# Patient Record
Sex: Male | Born: 1955 | Race: Black or African American | Hispanic: No | Marital: Single | Smoking: Current every day smoker
Health system: Southern US, Community
[De-identification: ages and names within clinical notes are randomized; demographics above are authoritative.]

## PROBLEM LIST (undated history)

## (undated) DIAGNOSIS — N19 Unspecified kidney failure: Secondary | ICD-10-CM

## (undated) DIAGNOSIS — M109 Gout, unspecified: Secondary | ICD-10-CM

## (undated) DIAGNOSIS — I10 Essential (primary) hypertension: Secondary | ICD-10-CM

## (undated) HISTORY — DX: Unspecified kidney failure: N19

---

## 2012-10-10 ENCOUNTER — Emergency Department (HOSPITAL_COMMUNITY)
Admission: EM | Admit: 2012-10-10 | Discharge: 2012-10-10 | Disposition: A | Discharge status: Home / Self Care | Payer: No Typology Code available for payment source | Attending: Emergency Medicine | Admitting: Emergency Medicine

## 2012-10-10 ENCOUNTER — Encounter (HOSPITAL_COMMUNITY): Payer: Self-pay | Admitting: Nurse Practitioner

## 2012-10-10 DIAGNOSIS — X500XXA Overexertion from strenuous movement or load, initial encounter: Secondary | ICD-10-CM | POA: Insufficient documentation

## 2012-10-10 DIAGNOSIS — Y929 Unspecified place or not applicable: Secondary | ICD-10-CM | POA: Insufficient documentation

## 2012-10-10 DIAGNOSIS — S93609A Unspecified sprain of unspecified foot, initial encounter: Secondary | ICD-10-CM | POA: Insufficient documentation

## 2012-10-10 DIAGNOSIS — Y9389 Activity, other specified: Secondary | ICD-10-CM | POA: Insufficient documentation

## 2012-10-10 DIAGNOSIS — S93602A Unspecified sprain of left foot, initial encounter: Secondary | ICD-10-CM

## 2012-10-10 DIAGNOSIS — I10 Essential (primary) hypertension: Secondary | ICD-10-CM

## 2012-10-10 MED ORDER — HYDROCODONE-ACETAMINOPHEN 5-325 MG PO TABS: 1.0000 | ORAL_TABLET | ORAL | Status: DC | PRN

## 2012-10-10 MED ORDER — IBUPROFEN 800 MG PO TABS: 800.0000 mg | ORAL_TABLET | Freq: Three times a day (TID) | ORAL | Status: DC

## 2012-10-10 MED ORDER — HYDROCHLOROTHIAZIDE 25 MG PO TABS: 25.0000 mg | ORAL_TABLET | Freq: Every day | ORAL | Status: DC

## 2012-10-10 NOTE — ED Provider Notes (Signed)
History    This chart was scribed for non-physician practitioner Charlann Lange, PA-C working with Kathalene Frames, MD by Hampton Abbot, ED Scribe. This patient was seen in room TR07C/TR07C and the patient's care was started at 5:42 PM.   CSN: [id number]  Arrival date & time 10/10/12  1613   First MD Initiated Contact with Patient 10/10/12 1723      Chief Complaint  Patient presents with  . Foot Pain     The history is provided by the patient. No language interpreter was used.  Brad [name] is a 57 y.o. male who presents to the Emergency Department complaining of left foot pain over dorsal surface first noticed when waking up this morning.  Pain worsened when bearing weight.  No associated injuries or traumas to the area.  Pt denies pain in plantar plane of foot, left ankle pain and pain in any of the left toes.  No h/o DM or gout.  Pt also reports that he has HTN for which he has had 30-day medication but that he is not currently being treated for. No PCP.  No past medical history on file.  No past surgical history on file.  No family history on file.  History  Substance Use Topics  . Smoking status: Never Smoker   . Smokeless tobacco: Not on file  . Alcohol Use: Yes      Review of Systems  Musculoskeletal:       Positive left foot pain  Skin: Negative for wound.    Allergies  Review of patient's allergies indicates no known allergies.  Home Medications  No current outpatient prescriptions on file.  BP 181/119  Pulse 91  Temp(Src) 99.1 F (37.3 C) (Oral)  Resp 16  SpO2 96%  Physical Exam  Nursing note and vitals reviewed. Constitutional: He is oriented to person, place, and time. He appears well-developed and well-nourished. No distress.  HENT:  Head: Normocephalic and atraumatic.  Eyes: EOM are normal.  Neck: Neck supple. No tracheal deviation present.  Cardiovascular: Normal rate.   Pulmonary/Chest: Effort normal. No respiratory distress.  Musculoskeletal:  Normal range of motion.  Mild erythema and swelling mid dorsal forefoot, no bony deformities, ankle and distal joints non-tender, calf non-tender  Neurological: He is alert and oriented to person, place, and time.  Skin: Skin is warm and dry.  Psychiatric: He has a normal mood and affect. His behavior is normal.    ED Course  Procedures (including critical care time) DIAGNOSTIC STUDIES: Oxygen Saturation is 96% on room air, adequate by my interpretation.    COORDINATION OF CARE: 5:47 PM- Informed pt that pain is most likely due to a mild sprain and to treat area with rest, ice, and elevation.  Discussed anti-inflammatories and stronger pain medicine for pain control at this time, but that establishing relationship with PCP is necessary and provided pt with contact information.  Pt verbalizes understanding and agrees with plan.     No diagnosis found.  1. Left foot strain/sprain 2. Hypertension   MDM  No redness or warmth to suggest cellulitis or infection. Does not involve joint - not gout or arthritic condition. C/w sprain or strain injury.  Hypertension history with elevated BP today. No cardiovascular symptoms. Will re-start medications and refer to PCP.       I personally performed the services described in this documentation, which was scribed in my presence. The recorded information has been reviewed and is accurate.     Dewaine Oats,  PA-C 10/10/12 1755

## 2012-10-10 NOTE — Progress Notes (Signed)
Orthopedic Tech Progress Note Patient Details:  Brad [name] 31-Jan-1956 [id number]  Ortho Devices Type of Ortho Device: ASO;Crutches Ortho Device/Splint Location: left ankle/foot Ortho Device/Splint Interventions: Application   Hildred Priest 10/10/2012, 6:31 PM

## 2012-10-10 NOTE — ED Notes (Signed)
Woke this am with L foot pain. Took tylenol with some relief. Denies any injuries, cms intact

## 2012-10-10 NOTE — Discharge Instructions (Signed)
CALL [facility] TO REQUEST A LIST OF PARTICIPATING PHYSICIANS IN THE AREA SO THAT YOU CAN GET ESTABLISHED WITH A PRIMARY CARE PHYSICIAN FOR CARE OF HIGH BLOOD PRESSURE AND RECURRENT FOOT PAIN. ICE AND ELEVATE FOOT. TAKE MEDICATIONS AS PRESCRIBED.   Arterial Hypertension Arterial hypertension (high blood pressure) is a condition of elevated pressure in your blood vessels. Hypertension over a long period of time is a risk factor for strokes, heart attacks, and heart failure. It is also the leading cause of kidney (renal) failure.  CAUSES   In Adults -- Over 90% of all hypertension has no known cause. This is called essential or primary hypertension. In the other 10% of people with hypertension, the increase in blood pressure is caused by another disorder. This is called secondary hypertension. Important causes of secondary hypertension are:  Heavy alcohol use.  Obstructive sleep apnea.  Hyperaldosterosim (Conn's syndrome).  Steroid use.  Chronic kidney failure.  Hyperparathyroidism.  Medications.  Renal artery stenosis.  Pheochromocytoma.  Cushing's disease.  Coarctation of the aorta.  Scleroderma renal crisis.  Licorice (in excessive amounts).  Drugs (cocaine, methamphetamine). Your caregiver can explain any items above that apply to you.  In Children -- Secondary hypertension is more common and should always be considered.  Pregnancy -- Few women of childbearing age have high blood pressure. However, up to 10% of them develop hypertension of pregnancy. Generally, this will not harm the woman. It may be a sign of 3 complications of pregnancy: preeclampsia, HELLP syndrome, and eclampsia. Follow up and control with medication is necessary. SYMPTOMS   This condition normally does not produce any noticeable symptoms. It is usually found during a routine exam.  Malignant hypertension is a late problem of high blood pressure. It may have the following  symptoms:  Headaches.  Blurred vision.  End-organ damage (this means your kidneys, heart, lungs, and other organs are being damaged).  Stressful situations can increase the blood pressure. If a person with normal blood pressure has their blood pressure go up while being seen by their caregiver, this is often termed "white coat hypertension." Its importance is not known. It may be related with eventually developing hypertension or complications of hypertension.  Hypertension is often confused with mental tension, stress, and anxiety. DIAGNOSIS  The diagnosis is made by 3 separate blood pressure measurements. They are taken at least 1 week apart from each other. If there is organ damage from hypertension, the diagnosis may be made without repeat measurements. Hypertension is usually identified by having blood pressure readings:  Above 140/90 mmHg measured in both arms, at 3 separate times, over a couple weeks.  Over 130/80 mmHg should be considered a risk factor and may require treatment in patients with diabetes. Blood pressure readings over 120/80 mmHg are called "pre-hypertension" even in non-diabetic patients. To get a true blood pressure measurement, use the following guidelines. Be aware of the factors that can alter blood pressure readings.  Take measurements at least 1 hour after caffeine.  Take measurements 30 minutes after smoking and without any stress. This is another reason to quit smoking  it raises your blood pressure.  Use a proper cuff size. Ask your caregiver if you are not sure about your cuff size.  Most home blood pressure cuffs are automatic. They will measure systolic and diastolic pressures. The systolic pressure is the pressure reading at the start of sounds. Diastolic pressure is the pressure at which the sounds disappear. If you are elderly, measure pressures in multiple postures.  Try sitting, lying or standing.  Sit at rest for a minimum of 5 minutes before  taking measurements.  You should not be on any medications like decongestants. These are found in many cold medications.  Record your blood pressure readings and review them with your caregiver. If you have hypertension:  Your caregiver may do tests to be sure you do not have secondary hypertension (see "causes" above).  Your caregiver may also look for signs of metabolic syndrome. This is also called Syndrome X or Insulin Resistance Syndrome. You may have this syndrome if you have type 2 diabetes, abdominal obesity, and abnormal blood lipids in addition to hypertension.  Your caregiver will take your medical and family history and perform a physical exam.  Diagnostic tests may include blood tests (for glucose, cholesterol, potassium, and kidney function), a urinalysis, or an EKG. Other tests may also be necessary depending on your condition. PREVENTION  There are important lifestyle issues that you can adopt to reduce your chance of developing hypertension:  Maintain a normal weight.  Limit the amount of salt (sodium) in your diet.  Exercise often.  Limit alcohol intake.  Get enough potassium in your diet. Discuss specific advice with your caregiver.  Follow a DASH diet (dietary approaches to stop hypertension). This diet is rich in fruits, vegetables, and low-fat dairy products, and avoids certain fats. PROGNOSIS  Essential hypertension cannot be cured. Lifestyle changes and medical treatment can lower blood pressure and reduce complications. The prognosis of secondary hypertension depends on the underlying cause. Many people whose hypertension is controlled with medicine or lifestyle changes can live a normal, healthy life.  RISKS AND COMPLICATIONS  While high blood pressure alone is not an illness, it often requires treatment due to its short- and long-term effects on many organs. Hypertension increases your risk for:  CVAs or strokes (cerebrovascular accident).  Heart failure  due to chronically high blood pressure (hypertensive cardiomyopathy).  Heart attack (myocardial infarction).  Damage to the retina (hypertensive retinopathy).  Kidney failure (hypertensive nephropathy). Your caregiver can explain list items above that apply to you. Treatment of hypertension can significantly reduce the risk of complications. TREATMENT   For overweight patients, weight loss and regular exercise are recommended. Physical fitness lowers blood pressure.  Mild hypertension is usually treated with diet and exercise. A diet rich in fruits and vegetables, fat-free dairy products, and foods low in fat and salt (sodium) can help lower blood pressure. Decreasing salt intake decreases blood pressure in a 1/3 of people.  Stop smoking if you are a smoker. The steps above are highly effective in reducing blood pressure. While these actions are easy to suggest, they are difficult to achieve. Most patients with moderate or severe hypertension end up requiring medications to bring their blood pressure down to a normal level. There are several classes of medications for treatment. Blood pressure pills (antihypertensives) will lower blood pressure by their different actions. Lowering the blood pressure by 10 mmHg may decrease the risk of complications by as much as 25%. The goal of treatment is effective blood pressure control. This will reduce your risk for complications. Your caregiver will help you determine the best treatment for you according to your lifestyle. What is excellent treatment for one person, may not be for you. HOME CARE INSTRUCTIONS   Do not smoke.  Follow the lifestyle changes outlined in the "Prevention" section.  If you are on medications, follow the directions carefully. Blood pressure medications must be taken as prescribed. Skipping  doses reduces their benefit. It also puts you at risk for problems.  Follow up with your caregiver, as directed.  If you are asked to  monitor your blood pressure at home, follow the guidelines in the "Diagnosis" section above. SEEK MEDICAL CARE IF:   You think you are having medication side effects.  You have recurrent headaches or lightheadedness.  You have swelling in your ankles.  You have trouble with your vision. SEEK IMMEDIATE MEDICAL CARE IF:   You have sudden onset of chest pain or pressure, difficulty breathing, or other symptoms of a heart attack.  You have a severe headache.  You have symptoms of a stroke (such as sudden weakness, difficulty speaking, difficulty walking). MAKE SURE YOU:   Understand these instructions.  Will watch your condition.  Will get help right away if you are not doing well or get worse. Document Released: 06/07/2005 Document Revised: 08/30/2011 Document Reviewed: 01/05/2007 [facility] Patient Information 2013 [place].  Cryotherapy Cryotherapy means treatment with cold. Ice or gel packs can be used to reduce both pain and swelling. Ice is the most helpful within the first 24 to 48 hours after an injury or flareup from overusing a muscle or joint. Sprains, strains, spasms, burning pain, shooting pain, and aches can all be eased with ice. Ice can also be used when recovering from surgery. Ice is effective, has very few side effects, and is safe for most people to use. PRECAUTIONS  Ice is not a safe treatment option for people with:  Raynaud's phenomenon. This is a condition affecting small blood vessels in the extremities. Exposure to cold may cause your problems to return.  Cold hypersensitivity. There are many forms of cold hypersensitivity, including:  Cold urticaria. Red, itchy hives appear on the skin when the tissues begin to warm after being iced.  Cold erythema. This is a red, itchy rash caused by exposure to cold.  Cold hemoglobinuria. Red blood cells break down when the tissues begin to warm after being iced. The hemoglobin that carry oxygen are passed into  the urine because they cannot combine with blood proteins fast enough.  Numbness or altered sensitivity in the area being iced. If you have any of the following conditions, do not use ice until you have discussed cryotherapy with your caregiver:  Heart conditions, such as arrhythmia, angina, or chronic heart disease.  High blood pressure.  Healing wounds or open skin in the area being iced.  Current infections.  Rheumatoid arthritis.  Poor circulation.  Diabetes. Ice slows the blood flow in the region it is applied. This is beneficial when trying to stop inflamed tissues from spreading irritating chemicals to surrounding tissues. However, if you expose your skin to cold temperatures for too long or without the proper protection, you can damage your skin or nerves. Watch for signs of skin damage due to cold. HOME CARE INSTRUCTIONS Follow these tips to use ice and cold packs safely.  Place a dry or damp towel between the ice and skin. A damp towel will cool the skin more quickly, so you may need to shorten the time that the ice is used.  For a more rapid response, add gentle compression to the ice.  Ice for no more than 10 to 20 minutes at a time. The bonier the area you are icing, the less time it will take to get the benefits of ice.  Check your skin after 5 minutes to make sure there are no signs of a poor response  to cold or skin damage.  Rest 20 minutes or more in between uses.  Once your skin is numb, you can end your treatment. You can test numbness by very lightly touching your skin. The touch should be so light that you do not see the skin dimple from the pressure of your fingertip. When using ice, most people will feel these normal sensations in this order: cold, burning, aching, and numbness.  Do not use ice on someone who cannot communicate their responses to pain, such as small children or people with dementia. HOW TO MAKE AN ICE PACK Ice packs are the most common way  to use ice therapy. Other methods include ice massage, ice baths, and cryo-sprays. Muscle creams that cause a cold, tingly feeling do not offer the same benefits that ice offers and should not be used as a substitute unless recommended by your caregiver. To make an ice pack, do one of the following:  Place crushed ice or a bag of frozen vegetables in a sealable plastic bag. Squeeze out the excess air. Place this bag inside another plastic bag. Slide the bag into a pillowcase or place a damp towel between your skin and the bag.  Mix 3 parts water with 1 part rubbing alcohol. Freeze the mixture in a sealable plastic bag. When you remove the mixture from the freezer, it will be slushy. Squeeze out the excess air. Place this bag inside another plastic bag. Slide the bag into a pillowcase or place a damp towel between your skin and the bag. SEEK MEDICAL CARE IF:  You develop white spots on your skin. This may give the skin a blotchy (mottled) appearance.  Your skin turns blue or pale.  Your skin becomes waxy or hard.  Your swelling gets worse. MAKE SURE YOU:   Understand these instructions.  Will watch your condition.  Will get help right away if you are not doing well or get worse. Document Released: 02/01/2011 Document Revised: 08/30/2011 Document Reviewed: 02/01/2011 [facility] Patient Information 2013 [place].  Foot Sprain The muscles and cord like structures which attach muscle to bone (tendons) that surround the feet are made up of units. A foot sprain can occur at the weakest spot in any of these units. This condition is most often caused by injury to or overuse of the foot, as from playing contact sports, or aggravating a previous injury, or from poor conditioning, or obesity. SYMPTOMS  Pain with movement of the foot.  Tenderness and swelling at the injury site.  Loss of strength is present in moderate or severe sprains. THE THREE GRADES OR SEVERITY OF FOOT SPRAIN  ARE:  Mild (Grade I): Slightly pulled muscle without tearing of muscle or tendon fibers or loss of strength.  Moderate (Grade II): Tearing of fibers in a muscle, tendon, or at the attachment to bone, with small decrease in strength.  Severe (Grade III): Rupture of the muscle-tendon-bone attachment, with separation of fibers. Severe sprain requires surgical repair. Often repeating (chronic) sprains are caused by overuse. Sudden (acute) sprains are caused by direct injury or over-use. DIAGNOSIS  Diagnosis of this condition is usually by your own observation. If problems continue, a caregiver may be required for further evaluation and treatment. X-rays may be required to make sure there are not breaks in the bones (fractures) present. Continued problems may require physical therapy for treatment. PREVENTION  Use strength and conditioning exercises appropriate for your sport.  Warm up properly prior to working out.  Use athletic  shoes that are made for the sport you are participating in.  Allow adequate time for healing. Early return to activities makes repeat injury more likely, and can lead to an unstable arthritic foot that can result in prolonged disability. Mild sprains generally heal in 3 to 10 days, with moderate and severe sprains taking 2 to 10 weeks. Your caregiver can help you determine the proper time required for healing. HOME CARE INSTRUCTIONS   Apply ice to the injury for 15 to 20 minutes, 3 to 4 times per day. Put the ice in a plastic bag and place a towel between the bag of ice and your skin.  An elastic wrap (like an Ace bandage) may be used to keep swelling down.  Keep foot above the level of the heart, or at least raised on a footstool, when swelling and pain are present.  Try to avoid use other than gentle range of motion while the foot is painful. Do not resume use until instructed by your caregiver. Then begin use gradually, not increasing use to the point of pain. If  pain does develop, decrease use and continue the above measures, gradually increasing activities that do not cause discomfort, until you gradually achieve normal use.  Use crutches if and as instructed, and for the length of time instructed.  Keep injured foot and ankle wrapped between treatments.  Massage foot and ankle for comfort and to keep swelling down. Massage from the toes up towards the knee.  Only take over-the-counter or prescription medicines for pain, discomfort, or fever as directed by your caregiver. SEEK IMMEDIATE MEDICAL CARE IF:   Your pain and swelling increase, or pain is not controlled with medications.  You have loss of feeling in your foot or your foot turns cold or blue.  You develop new, unexplained symptoms, or an increase of the symptoms that brought you to your caregiver. MAKE SURE YOU:   Understand these instructions.  Will watch your condition.  Will get help right away if you are not doing well or get worse. Document Released: 11/27/2001 Document Revised: 08/30/2011 Document Reviewed: 01/25/2008 [facility] Patient Information 2013 [place].

## 2012-10-10 NOTE — ED Notes (Signed)
Pt denies any history of hypertension

## 2012-10-10 NOTE — ED Provider Notes (Signed)
Medical screening examination/treatment/procedure(s) were performed by non-physician practitioner and as supervising physician I was immediately available for consultation/collaboration.   Kathalene Frames, MD 10/10/12 [phone]

## 2013-09-08 ENCOUNTER — Encounter (HOSPITAL_COMMUNITY): Payer: Self-pay | Admitting: Emergency Medicine

## 2013-09-08 ENCOUNTER — Emergency Department (HOSPITAL_COMMUNITY)
Admission: EM | Admit: 2013-09-08 | Discharge: 2013-09-08 | Disposition: A | Discharge status: Home / Self Care | Payer: No Typology Code available for payment source | Attending: Emergency Medicine | Admitting: Emergency Medicine

## 2013-09-08 DIAGNOSIS — M109 Gout, unspecified: Secondary | ICD-10-CM | POA: Insufficient documentation

## 2013-09-08 DIAGNOSIS — Z79899 Other long term (current) drug therapy: Secondary | ICD-10-CM | POA: Insufficient documentation

## 2013-09-08 HISTORY — DX: Essential (primary) hypertension: I10

## 2013-09-08 MED ORDER — PREDNISONE 20 MG PO TABS
60.0000 mg | ORAL_TABLET | Freq: Once | ORAL | Status: AC
  Administered 2013-09-08: 60 mg via ORAL
  Filled 2013-09-08: qty 3

## 2013-09-08 MED ORDER — HYDROCODONE-ACETAMINOPHEN 5-325 MG PO TABS
1.0000 | ORAL_TABLET | Freq: Four times a day (QID) | ORAL | Status: DC | PRN
Start: 2013-09-08 — End: 2015-07-30

## 2013-09-08 MED ORDER — PREDNISONE 50 MG PO TABS: 50.0000 mg | ORAL_TABLET | Freq: Every day | ORAL | Status: DC

## 2013-09-08 NOTE — ED Provider Notes (Signed)
CSN: [id number]     Arrival date & time 09/08/13  1149 History  This chart was scribed for  Brent General PA-C  working with Alfonzo Feller, DO by Stacy Gardner, ED scribe. This patient was seen in room TR06C/TR06C and the patient's care was started at 12:19 PM.    Chief Complaint  Patient presents with  . Leg Pain     (Consider location/radiation/quality/duration/timing/severity/associated sxs/prior Treatment) Patient is a 58 y.o. male presenting with leg pain. The history is provided by the patient and medical records. No language interpreter was used.  Leg Pain  HPI Comments: Osbourne Holtzclaw is a 58 y.o. male who presents to the Emergency Department complaining of chronic moderate right leg pain for the past 10 days.  Pt has swelling and pain that extends down his lower leg. The pain and swelling is worse to the posterior knee, ankle and the ventral aspect of his foot and toes. He explains the pain "jumps" around and occurs in intermediate flares. He reports his symptoms are similar to his prior flare ups. He has tried muscle relaxer and [place] to no relief.  Nothing seems to make his symptoms better. Pt was told that he has gout but was later told at a following ED visit for the same symptoms that he does not have gout.  Past Medical History  Diagnosis Date  . Hypertension    History reviewed. No pertinent past surgical history. History reviewed. No pertinent family history. History  Substance Use Topics  . Smoking status: Never Smoker   . Smokeless tobacco: Not on file  . Alcohol Use: Yes    Review of Systems  Musculoskeletal: Positive for arthralgias, gait problem, joint swelling and myalgias.  All other systems reviewed and are negative.      Allergies  Review of patient's allergies indicates no known allergies.  Home Medications   Current Outpatient Rx  Name  Route  Sig  Dispense  Refill  . hydrochlorothiazide (HYDRODIURIL) 25 MG tablet   Oral  Take 1 tablet (25 mg total) by mouth daily.   30 tablet   0    BP 148/95  Pulse 108  Temp(Src) 99.1 F (37.3 C) (Oral)  Resp 16  Ht 5\' 11"  (1.803 m)  Wt 172 lb (78.019 kg)  BMI 24.00 kg/m2  SpO2 100% Physical Exam  Nursing note and vitals reviewed. Constitutional: He appears well-developed and well-nourished.  HENT:  Head: Normocephalic and atraumatic.  Musculoskeletal:       Right knee: He exhibits normal range of motion, no swelling and no effusion. Tenderness found.       Right ankle: He exhibits swelling. He exhibits normal range of motion, no ecchymosis and no deformity.  Skin: Skin is warm and dry.    ED Course  Procedures (including critical care time) DIAGNOSTIC STUDIES: Oxygen Saturation is 100% on room air, normal by my interpretation.    COORDINATION OF CARE:  12:23 PM Discussed course of care with pt . Pt understands and agrees.  Patient's findings are consistent with gout has pain and swelling to the foot pain and great toe pain. He does have some pain in the ankle.  He states that previous episodes of gout with similar presentations     Brent General, PA-C 09/08/13 1233

## 2013-09-08 NOTE — Discharge Instructions (Signed)
Ice and elevate her foot.  Return here as needed.  Followup with your primary care Dr.

## 2013-09-08 NOTE — ED Notes (Signed)
Pt states hes had RLE pain and swelling for past few days, from knee to toes. He has a history of gout and when he had medicine for gout his pain was better but he doesn't have anymore gout medication. He said this feels like gout. Hes tried muscle relaxer and bengay with no relief.

## 2013-09-10 NOTE — ED Provider Notes (Signed)
Medical screening examination/treatment/procedure(s) were performed by non-physician practitioner and as supervising physician I was immediately available for consultation/collaboration.   EKG Interpretation None        Alfonzo Feller, DO 09/10/13 2118

## 2013-09-20 ENCOUNTER — Other Ambulatory Visit: Payer: Self-pay | Admitting: Family Medicine

## 2013-09-20 ENCOUNTER — Ambulatory Visit
Admission: RE | Admit: 2013-09-20 | Discharge: 2013-09-20 | Disposition: A | Discharge status: Home / Self Care | Payer: PRIVATE HEALTH INSURANCE | Source: Ambulatory Visit | Attending: Family Medicine | Admitting: Family Medicine

## 2013-09-20 DIAGNOSIS — R609 Edema, unspecified: Secondary | ICD-10-CM

## 2013-09-20 DIAGNOSIS — R52 Pain, unspecified: Secondary | ICD-10-CM

## 2015-07-30 ENCOUNTER — Encounter (HOSPITAL_COMMUNITY): Payer: Self-pay | Admitting: Emergency Medicine

## 2015-07-30 ENCOUNTER — Emergency Department (HOSPITAL_COMMUNITY)
Admission: EM | Admit: 2015-07-30 | Discharge: 2015-07-30 | Disposition: A | Discharge status: Home / Self Care | Payer: No Typology Code available for payment source | Attending: Emergency Medicine | Admitting: Emergency Medicine

## 2015-07-30 DIAGNOSIS — I10 Essential (primary) hypertension: Secondary | ICD-10-CM | POA: Insufficient documentation

## 2015-07-30 DIAGNOSIS — M109 Gout, unspecified: Secondary | ICD-10-CM | POA: Insufficient documentation

## 2015-07-30 DIAGNOSIS — F17210 Nicotine dependence, cigarettes, uncomplicated: Secondary | ICD-10-CM | POA: Insufficient documentation

## 2015-07-30 DIAGNOSIS — M545 Low back pain: Secondary | ICD-10-CM | POA: Diagnosis not present

## 2015-07-30 HISTORY — DX: Gout, unspecified: M10.9

## 2015-07-30 MED ORDER — HYDROMORPHONE HCL 1 MG/ML IJ SOLN
1.0000 mg | Freq: Once | INTRAMUSCULAR | Status: AC
  Administered 2015-07-30: 1 mg via INTRAMUSCULAR
  Filled 2015-07-30: qty 1

## 2015-07-30 MED ORDER — KETOROLAC TROMETHAMINE 15 MG/ML IJ SOLN
15.0000 mg | Freq: Once | INTRAMUSCULAR | Status: AC
  Administered 2015-07-30: 15 mg via INTRAMUSCULAR
  Filled 2015-07-30: qty 1

## 2015-07-30 MED ORDER — OXYCODONE-ACETAMINOPHEN 5-325 MG PO TABS: 1.0000 | ORAL_TABLET | ORAL | Status: DC | PRN

## 2015-07-30 MED ORDER — DEXAMETHASONE 4 MG PO TABS
12.0000 mg | ORAL_TABLET | Freq: Once | ORAL | Status: AC
  Administered 2015-07-30: 12 mg via ORAL
  Filled 2015-07-30: qty 3

## 2015-07-30 MED ORDER — PREDNISONE 20 MG PO TABS: 40.0000 mg | ORAL_TABLET | Freq: Every day | ORAL | Status: DC

## 2015-07-30 MED ORDER — DIAZEPAM 5 MG PO TABS
5.0000 mg | ORAL_TABLET | Freq: Once | ORAL | Status: AC
  Administered 2015-07-30: 5 mg via ORAL
  Filled 2015-07-30: qty 1

## 2015-07-30 NOTE — ED Notes (Signed)
Bed: WA01 Expected date:  Expected time:  Means of arrival:  Comments: EMS- 56 yoM, back pain

## 2015-07-30 NOTE — ED Notes (Signed)
Pt from home , per PTAR pt reports lower right back x 3 days, radiating to right leg. Denies Hx sciatica. Hx gout yet out of meds x 3 months. Per ems pt believes that this is a gout flare. ambulated to ambulance with three individuals assistance. Alert and oriented x 4.

## 2015-07-30 NOTE — Discharge Instructions (Signed)

## 2015-08-01 ENCOUNTER — Emergency Department (HOSPITAL_COMMUNITY)
Admission: EM | Admit: 2015-08-01 | Discharge: 2015-08-01 | Disposition: A | Discharge status: Home / Self Care | Payer: No Typology Code available for payment source | Attending: Emergency Medicine | Admitting: Emergency Medicine

## 2015-08-01 ENCOUNTER — Encounter (HOSPITAL_COMMUNITY): Payer: Self-pay

## 2015-08-01 DIAGNOSIS — F17210 Nicotine dependence, cigarettes, uncomplicated: Secondary | ICD-10-CM | POA: Insufficient documentation

## 2015-08-01 DIAGNOSIS — I10 Essential (primary) hypertension: Secondary | ICD-10-CM | POA: Diagnosis not present

## 2015-08-01 DIAGNOSIS — M545 Low back pain: Secondary | ICD-10-CM | POA: Diagnosis present

## 2015-08-01 DIAGNOSIS — M109 Gout, unspecified: Secondary | ICD-10-CM | POA: Diagnosis not present

## 2015-08-01 DIAGNOSIS — M5441 Lumbago with sciatica, right side: Secondary | ICD-10-CM | POA: Diagnosis not present

## 2015-08-01 DIAGNOSIS — Z7952 Long term (current) use of systemic steroids: Secondary | ICD-10-CM | POA: Diagnosis not present

## 2015-08-01 MED ORDER — OXYCODONE-ACETAMINOPHEN 5-325 MG PO TABS: 2.0000 | ORAL_TABLET | ORAL | Status: DC | PRN

## 2015-08-01 MED ORDER — DEXAMETHASONE SODIUM PHOSPHATE 10 MG/ML IJ SOLN: 10.0000 mg | Freq: Once | INTRAMUSCULAR | Status: DC

## 2015-08-01 MED ORDER — DEXAMETHASONE SODIUM PHOSPHATE 10 MG/ML IJ SOLN
10.0000 mg | Freq: Once | INTRAMUSCULAR | Status: AC
  Administered 2015-08-01: 10 mg via INTRAMUSCULAR
  Filled 2015-08-01: qty 1

## 2015-08-01 MED ORDER — KETOROLAC TROMETHAMINE 60 MG/2ML IM SOLN
60.0000 mg | Freq: Once | INTRAMUSCULAR | Status: AC
  Administered 2015-08-01: 60 mg via INTRAMUSCULAR
  Filled 2015-08-01: qty 2

## 2015-08-01 MED ORDER — INDOMETHACIN 25 MG PO CAPS: 25.0000 mg | ORAL_CAPSULE | Freq: Three times a day (TID) | ORAL | Status: DC | PRN

## 2015-08-01 NOTE — Discharge Instructions (Signed)
Sciatica °Sciatica is pain, weakness, numbness, or tingling along the path of the sciatic nerve. The nerve starts in the lower back and runs down the back of each leg. The nerve controls the muscles in the lower leg and in the back of the knee, while also providing sensation to the back of the thigh, lower leg, and the sole of your foot. Sciatica is a symptom of another medical condition. For instance, nerve damage or certain conditions, such as a herniated disk or bone spur on the spine, pinch or put pressure on the sciatic nerve. This causes the pain, weakness, or other sensations normally associated with sciatica. Generally, sciatica only affects one side of the body. °CAUSES  °· Herniated or slipped disc. °· Degenerative disk disease. °· A pain disorder involving the narrow muscle in the buttocks (piriformis syndrome). °· Pelvic injury or fracture. °· Pregnancy. °· Tumor (rare). °SYMPTOMS  °Symptoms can vary from mild to very severe. The symptoms usually travel from the low back to the buttocks and down the back of the leg. Symptoms can include: °· Mild tingling or dull aches in the lower back, leg, or hip. °· Numbness in the back of the calf or sole of the foot. °· Burning sensations in the lower back, leg, or hip. °· Sharp pains in the lower back, leg, or hip. °· Leg weakness. °· Severe back pain inhibiting movement. °These symptoms may get worse with coughing, sneezing, laughing, or prolonged sitting or standing. Also, being overweight may worsen symptoms. °DIAGNOSIS  °Your caregiver will perform a physical exam to look for common symptoms of sciatica. He or she may ask you to do certain movements or activities that would trigger sciatic nerve pain. Other tests may be performed to find the cause of the sciatica. These may include: °· Blood tests. °· X-rays. °· Imaging tests, such as an MRI or CT scan. °TREATMENT  °Treatment is directed at the cause of the sciatic pain. Sometimes, treatment is not necessary  and the pain and discomfort goes away on its own. If treatment is needed, your caregiver may suggest: °· Over-the-counter medicines to relieve pain. °· Prescription medicines, such as anti-inflammatory medicine, muscle relaxants, or narcotics. °· Applying heat or ice to the painful area. °· Steroid injections to lessen pain, irritation, and inflammation around the nerve. °· Reducing activity during periods of pain. °· Exercising and stretching to strengthen your abdomen and improve flexibility of your spine. Your caregiver may suggest losing weight if the extra weight makes the back pain worse. °· Physical therapy. °· Surgery to eliminate what is pressing or pinching the nerve, such as a bone spur or part of a herniated disk. °HOME CARE INSTRUCTIONS  °· Only take over-the-counter or prescription medicines for pain or discomfort as directed by your caregiver. °· Apply ice to the affected area for 20 minutes, 3-4 times a day for the first 48-72 hours. Then try heat in the same way. °· Exercise, stretch, or perform your usual activities if these do not aggravate your pain. °· Attend physical therapy sessions as directed by your caregiver. °· Keep all follow-up appointments as directed by your caregiver. °· Do not wear high heels or shoes that do not provide proper support. °· Check your mattress to see if it is too soft. A firm mattress may lessen your pain and discomfort. °SEEK IMMEDIATE MEDICAL CARE IF:  °· You lose control of your bowel or bladder (incontinence). °· You have increasing weakness in the lower back, pelvis, buttocks,   or legs.  You have redness or swelling of your back.  You have a burning sensation when you urinate.  You have pain that gets worse when you lie down or awakens you at night.  Your pain is worse than you have experienced in the past.  Your pain is lasting longer than 4 weeks.  You are suddenly losing weight without reason. MAKE SURE YOU:  Understand these  instructions.  Will watch your condition.  Will get help right away if you are not doing well or get worse.   This information is not intended to replace advice given to you by your health care provider. Make sure you discuss any questions you have with your health care provider.   Keep scheduled appointment with your primary care doctor for 1 week from now. Apply ice to affected area. Take pain medication as prescribed. Return to the emergency department if you experience severe worsening of her symptoms, bowel or bladder incontinence, numbness or tingling in both extremities, fever.

## 2015-08-01 NOTE — ED Provider Notes (Signed)
CSN: [id number]     Arrival date & time 08/01/15  1134 History  By signing my name below, I, Rowan Blase, attest that this documentation has been prepared under the direction and in the presence of non-physician practitioner, Donnald Garre PA-C. Electronically Signed: Rowan Blase, Scribe. 08/01/2015. 12:56 PM.   Chief Complaint  Patient presents with  . Back Pain   The history is provided by the patient. No language interpreter was used.    HPI Comments:  Brad [name] is a 60 y.o. male with a PMHx of Gout and HTN who presents to the Emergency Department complaining of intermittent, moderate-severe, shooting pain from bottom of right knee through right hip, onset five days ago. Pt reports pain radiates to right lower back. He notes he was walking up stairs and the pain started when he reached the top of the stairs. He states he has had gout pain in this spot previously and his flare-ups have lasted this long before. Pt was evaluated in the ED two days ago; a steroid shot was administered in the ED and he was discharged with two doses of Percocet. Pt reports taking Percocet this morning with no relief; he notes the injection provided mild relief. He states he has an appointment with his PCP in a week, but wanted something for the pain today. Pt is unsure of past pain medication. Pt denies trauma or injury. No bowel or bladder incontinence, no saddle anesthesia or fever.  Past Medical History  Diagnosis Date  . Hypertension   . Gout    History reviewed. No pertinent past surgical history. Family History  Problem Relation Age of Onset  . Diabetes Mother    Social History  Substance Use Topics  . Smoking status: Current Every Day Smoker -- 0.15 packs/day    Types: Cigarettes  . Smokeless tobacco: Never Used  . Alcohol Use: Yes     Comment: 16 ounce beer a day    Review of Systems  All other systems reviewed and are negative.     Allergies  Review of patient's allergies  indicates no known allergies.  Home Medications   Prior to Admission medications   Medication Sig Start Date End Date Taking? Authorizing Provider  acetaminophen (TYLENOL) 500 MG tablet Take 500 mg by mouth every 6 (six) hours as needed for moderate pain.   Yes Historical Provider, MD  ibuprofen (ADVIL,MOTRIN) 200 MG tablet Take 400 mg by mouth every 6 (six) hours as needed for moderate pain.   Yes Historical Provider, MD  oxyCODONE-acetaminophen (PERCOCET/ROXICET) 5-325 MG tablet Take 1 tablet by mouth every 4 (four) hours as needed for severe pain. 07/30/15  Yes Virgel Manifold, MD  predniSONE (DELTASONE) 20 MG tablet Take 2 tablets (40 mg total) by mouth daily. 07/30/15  Yes Virgel Manifold, MD   BP 165/108 mmHg  Pulse 84  Temp(Src) 98.5 F (36.9 C) (Oral)  Resp 18  SpO2 98% Physical Exam  Constitutional: He is oriented to person, place, and time. He appears well-developed and well-nourished. No distress.  HENT:  Head: Normocephalic and atraumatic.  Eyes: Conjunctivae are normal. Right eye exhibits no discharge. Left eye exhibits no discharge. No scleral icterus.  Cardiovascular: Normal rate.   Pulmonary/Chest: Effort normal.  Musculoskeletal:       Lumbar back: He exhibits normal range of motion, no bony tenderness, no swelling, no deformity and no spasm.       Back:  Neurological: He is alert and oriented to person, place, and time. Coordination  normal.  Strength 5/5 throughout. No sensory deficits.  No gait abnormality.  Skin: Skin is warm and dry. No rash noted. He is not diaphoretic. No erythema. No pallor.  Psychiatric: He has a normal mood and affect. His behavior is normal.  Nursing note and vitals reviewed.   ED Course  Procedures  DIAGNOSTIC STUDIES:  Oxygen Saturation is 98% on RA, normal by my interpretation.    COORDINATION OF CARE:  12:48 PM Will administer steroid and Toradol injection. Will discharge with pain medication. Discussed treatment plan with pt at  bedside and pt agreed to plan.  Labs Review Labs Reviewed - No data to display  Imaging Review No results found. I have personally reviewed and evaluated these images and lab results as part of my medical decision-making.   EKG Interpretation None     MDM   Final diagnoses:  Right-sided low back pain with right-sided sciatica   Patient with back pain. Pt was seen in ED 2 days ago for same symptoms and is now out of pain medications.  No neurological deficits and normal neuro exam.  Patient is ambulatory.  No loss of bowel or bladder control.  No concern for cauda equina.  No fever, night sweats, weight loss, h/o cancer, IVDA, no recent procedure to back. No urinary symptoms suggestive of UTI.  Pain managed in ED. Supportive care and return precaution discussed. Appears safe for discharge at this time. Pt is scheduled to follow up with PCP in 1 week, will provide pain medication for breakthrough pain until this time.    I personally performed the services described in this documentation, which was scribed in my presence. The recorded information has been reviewed and is accurate.     Dondra Spry [place], PA-C 08/01/15 1837  Quintella Reichert, MD 08/02/15 [phone]

## 2015-08-01 NOTE — ED Notes (Signed)
Patient c/o right lower back pain that radiates down the right leg. Patient states he was seen 2 days ago for the same, received medication and has taken all that was prescribed, but no relief.

## 2015-08-05 NOTE — ED Provider Notes (Signed)
CSN: [id number]     Arrival date & time 07/30/15  0940 History   First MD Initiated Contact with Patient 07/30/15 [phone]     Chief Complaint  Patient presents with  . Back Pain    right     (Consider location/radiation/quality/duration/timing/severity/associated sxs/prior Treatment) HPI   60 year old male with right lower back pain. Gradual onset about 3 days ago. Progressively worsening. Denies any trauma or strain. No fevers or chills. Pain radiates into his right thigh. Worse with movement.No acute numbness, tingling or focal loss of strength. No urinary complaints. No incontinence or retention.Denies any prior back surgery.  Past Medical History  Diagnosis Date  . Hypertension   . Gout    History reviewed. No pertinent past surgical history. Family History  Problem Relation Age of Onset  . Diabetes Mother    Social History  Substance Use Topics  . Smoking status: Current Every Day Smoker -- 0.15 packs/day    Types: Cigarettes  . Smokeless tobacco: Never Used  . Alcohol Use: Yes     Comment: 16 ounce beer a day    Review of Systems  All systems reviewed and negative, other than as noted in HPI.   Allergies  Review of patient's allergies indicates no known allergies.  Home Medications   Prior to Admission medications   Medication Sig Start Date End Date Taking? Authorizing Provider  acetaminophen (TYLENOL) 500 MG tablet Take 500 mg by mouth every 6 (six) hours as needed for moderate pain.   Yes Historical Provider, MD  ibuprofen (ADVIL,MOTRIN) 200 MG tablet Take 400 mg by mouth every 6 (six) hours as needed for moderate pain.   Yes Historical Provider, MD  indomethacin (INDOCIN) 25 MG capsule Take 1 capsule (25 mg total) by mouth 3 (three) times daily as needed. 08/01/15   Samantha Tripp Dowless, PA-C  oxyCODONE-acetaminophen (PERCOCET/ROXICET) 5-325 MG tablet Take 1 tablet by mouth every 4 (four) hours as needed for severe pain. 07/30/15   Virgel Manifold, MD   oxyCODONE-acetaminophen (PERCOCET/ROXICET) 5-325 MG tablet Take 2 tablets by mouth every 4 (four) hours as needed for severe pain. 08/01/15   Samantha Tripp Dowless, PA-C  predniSONE (DELTASONE) 20 MG tablet Take 2 tablets (40 mg total) by mouth daily. 07/30/15   Virgel Manifold, MD   BP 163/109 mmHg  Pulse 84  Temp(Src) 98.2 F (36.8 C) (Oral)  Resp 16  SpO2 99% Physical Exam  Constitutional: He appears well-developed and well-nourished. No distress.  HENT:  Head: Normocephalic and atraumatic.  Eyes: Conjunctivae are normal. Right eye exhibits no discharge. Left eye exhibits no discharge.  Neck: Neck supple.  Cardiovascular: Normal rate, regular rhythm and normal heart sounds.  Exam reveals no gallop and no friction rub.   No murmur heard. Pulmonary/Chest: Effort normal and breath sounds normal. No respiratory distress.  Abdominal: Soft. He exhibits no distension. There is no tenderness.  Musculoskeletal: He exhibits no edema or tenderness.  Patient reports pain in his right lower back near his right SI joint and just lateral to it. It is not reproducible with palpation though.  No overlying skin changes. Sensation is intact to light touch lower extremities. Strength is 5 out of 5 bilateral lower extremity is.   Neurological: He is alert.  Skin: Skin is warm and dry.  Psychiatric: He has a normal mood and affect. His behavior is normal. Thought content normal.  Nursing note and vitals reviewed.   ED Course  Procedures (including critical care time) Labs Review Labs Reviewed -  No data to display  Imaging Review No results found. I have personally reviewed and evaluated these images and lab results as part of my medical decision-making.   EKG Interpretation None      MDM   Final diagnoses:  Right low back pain, with sciatica presence unspecified    61 year old male with atraumatic right lower back pain.  Consistent with musculoskeletal etiology. No CVA tenderness. No  urinary complaints.No concerning "red flags." Able to ambulate without assistance after pain medications. Plan symptomatic treatment at this time. Return cautions were discussed.    Virgel Manifold, MD 08/05/15 1248

## 2016-11-19 ENCOUNTER — Encounter (HOSPITAL_COMMUNITY): Payer: Self-pay | Admitting: Emergency Medicine

## 2016-11-19 ENCOUNTER — Emergency Department (HOSPITAL_COMMUNITY)
Admission: EM | Admit: 2016-11-19 | Discharge: 2016-11-19 | Disposition: A | Discharge status: Home / Self Care | Payer: No Typology Code available for payment source | Source: Home / Self Care | Attending: Physician Assistant | Admitting: Physician Assistant

## 2016-11-19 ENCOUNTER — Emergency Department (HOSPITAL_COMMUNITY): Payer: No Typology Code available for payment source

## 2016-11-19 DIAGNOSIS — L02414 Cutaneous abscess of left upper limb: Secondary | ICD-10-CM

## 2016-11-19 DIAGNOSIS — L03114 Cellulitis of left upper limb: Secondary | ICD-10-CM

## 2016-11-19 DIAGNOSIS — I10 Essential (primary) hypertension: Secondary | ICD-10-CM | POA: Insufficient documentation

## 2016-11-19 DIAGNOSIS — F17210 Nicotine dependence, cigarettes, uncomplicated: Secondary | ICD-10-CM | POA: Insufficient documentation

## 2016-11-19 LAB — CBC WITH DIFFERENTIAL/PLATELET
Basophils Absolute: 0 10*3/uL (ref 0.0–0.1)
Basophils Relative: 0 %
Eosinophils Absolute: 0.1 10*3/uL (ref 0.0–0.7)
Eosinophils Relative: 1 %
HCT: 36.4 % — ABNORMAL LOW (ref 39.0–52.0)
Hemoglobin: 12.5 g/dL — ABNORMAL LOW (ref 13.0–17.0)
Lymphocytes Relative: 20 %
Lymphs Abs: 2.1 10*3/uL (ref 0.7–4.0)
MCH: 32.2 pg (ref 26.0–34.0)
MCHC: 34.3 g/dL (ref 30.0–36.0)
MCV: 93.8 fL (ref 78.0–100.0)
Monocytes Absolute: 0.7 10*3/uL (ref 0.1–1.0)
Monocytes Relative: 6 %
Neutro Abs: 7.6 10*3/uL (ref 1.7–7.7)
Neutrophils Relative %: 73 %
Platelets: 443 10*3/uL — ABNORMAL HIGH (ref 150–400)
RBC: 3.88 MIL/uL — ABNORMAL LOW (ref 4.22–5.81)
RDW: 13.4 % (ref 11.5–15.5)
WBC: 10.5 10*3/uL (ref 4.0–10.5)

## 2016-11-19 LAB — BASIC METABOLIC PANEL
Anion gap: 7 (ref 5–15)
BUN: 7 mg/dL (ref 6–20)
CO2: 24 mmol/L (ref 22–32)
Calcium: 9.4 mg/dL (ref 8.9–10.3)
Chloride: 102 mmol/L (ref 101–111)
Creatinine, Ser: 1.14 mg/dL (ref 0.61–1.24)
GFR calc Af Amer: >60 mL/min (ref >60)
GFR calc non Af Amer: >60 mL/min (ref >60)
Glucose, Bld: 104 mg/dL — ABNORMAL HIGH (ref 65–99)
Potassium: 3.6 mmol/L (ref 3.5–5.1)
Sodium: 133 mmol/L — ABNORMAL LOW (ref 135–145)

## 2016-11-19 MED ORDER — CEPHALEXIN 500 MG PO CAPS: 500.0000 mg | ORAL_CAPSULE | Freq: Four times a day (QID) | ORAL | 0 refills | Status: DC

## 2016-11-19 MED ORDER — SODIUM CHLORIDE 0.9 % IV BOLUS (SEPSIS)
500.0000 mL | Freq: Once | INTRAVENOUS | Status: AC
  Administered 2016-11-19: 500 mL via INTRAVENOUS

## 2016-11-19 MED ORDER — KETOROLAC TROMETHAMINE 30 MG/ML IJ SOLN
30.0000 mg | Freq: Once | INTRAMUSCULAR | Status: AC
  Administered 2016-11-19: 30 mg via INTRAVENOUS
  Filled 2016-11-19: qty 1

## 2016-11-19 MED ORDER — CEPHALEXIN 250 MG PO CAPS
500.0000 mg | ORAL_CAPSULE | Freq: Once | ORAL | Status: AC
  Administered 2016-11-19: 500 mg via ORAL
  Filled 2016-11-19: qty 2

## 2016-11-19 NOTE — Discharge Instructions (Signed)
You have evidence of an infection in the hand. Please take all of your antibiotics until finished!   You may develop abdominal discomfort or diarrhea from the antibiotic.  You may help offset this with probiotics which you can buy or get in yogurt. Do not eat or take the probiotics until 2 hours after your antibiotic.   Ibuprofen, naproxen, or Tylenol for pain.  Follow-up with your primary care provider as soon as possible on this matter. Follow up with the hand specialist as needed should symptoms worsen. May also return to the ED should symptoms worsen.

## 2016-11-19 NOTE — ED Provider Notes (Signed)
[facility] DEPT Provider Note   CSN: [id number] Arrival date & time: 11/19/16  1053     History   Chief Complaint Chief Complaint  Patient presents with  . Insect Bite    HPI Brad [name] is a 61 y.o. male.  HPI   Brad [name] is a 61 y.o. male, with a history of HTN and gout, presenting to the ED with left hand pain worsening over the past two days. Patient states he felt a "stinging" sensation to his hand near his little finger, looked down, and saw a black spider in the area. The pain, redness, and swelling has worsened to involve his whole left hand and into the wrist. Pain is now 10/10 and aching. Has not taken any medications for this issue. No antibiotic use in previous 3 months. Denies fever, neuro deficits, or any other complaints.      Past Medical History:  Diagnosis Date  . Gout   . Hypertension     There are no active problems to display for this patient.   History reviewed. No pertinent surgical history.     Home Medications    Prior to Admission medications   Medication Sig Start Date End Date Taking? Authorizing Provider  acetaminophen (TYLENOL) 500 MG tablet Take 500 mg by mouth every 6 (six) hours as needed for moderate pain.   Yes [provider]  ibuprofen (ADVIL,MOTRIN) 200 MG tablet Take 400 mg by mouth every 6 (six) hours as needed for moderate pain.   Yes [provider]  PRESCRIPTION MEDICATION Take 1 tablet by mouth daily. Blood pressure medication. Unknown name, pt has not filled at his pharmacy   Yes [provider]  cephALEXin (KEFLEX) 500 MG capsule Take 1 capsule (500 mg total) by mouth 4 (four) times daily. 11/19/16 11/26/16  Erie Sica C, PA-C  indomethacin (INDOCIN) 25 MG capsule Take 1 capsule (25 mg total) by mouth 3 (three) times daily as needed. Patient not taking: Reported on 11/19/2016 08/01/15   Dowless, Dondra Spry, PA-C  oxyCODONE-acetaminophen (PERCOCET/ROXICET) 5-325 MG tablet Take 1 tablet by  mouth every 4 (four) hours as needed for severe pain. Patient not taking: Reported on 11/19/2016 07/30/15   Virgel Manifold, MD  oxyCODONE-acetaminophen (PERCOCET/ROXICET) 5-325 MG tablet Take 2 tablets by mouth every 4 (four) hours as needed for severe pain. Patient not taking: Reported on 11/19/2016 08/01/15   Dowless, Aldona Bar Tripp, PA-C  predniSONE (DELTASONE) 20 MG tablet Take 2 tablets (40 mg total) by mouth daily. Patient not taking: Reported on 11/19/2016 07/30/15   Virgel Manifold, MD    Family History Family History  Problem Relation Age of Onset  . Diabetes Mother     Social History Social History  Substance Use Topics  . Smoking status: Current Every Day Smoker    Packs/day: 0.15    Types: Cigarettes  . Smokeless tobacco: Never Used  . Alcohol use Yes     Comment: 16 ounce beer a day     Allergies   Patient has no known allergies.   Review of Systems Review of Systems  Constitutional: Negative for chills and fever.  Gastrointestinal: Negative for nausea and vomiting.  Musculoskeletal: Positive for arthralgias, joint swelling and myalgias.  Skin: Positive for color change.  Neurological: Negative for weakness and numbness.  All other systems reviewed and are negative.    Physical Exam Updated Vital Signs BP (!) 178/122   Pulse 83   Temp 98.3 F (36.8 C) (Oral)   Resp 15  SpO2 100%   Physical Exam  Constitutional: He appears well-developed and well-nourished. No distress.  HENT:  Head: Normocephalic and atraumatic.  Eyes: Conjunctivae are normal.  Neck: Neck supple.  Cardiovascular: Normal rate, regular rhythm, normal heart sounds and intact distal pulses.   Pulmonary/Chest: Effort normal and breath sounds normal. No respiratory distress.  Abdominal: Soft. There is no tenderness. There is no guarding.  Musculoskeletal: He exhibits no edema.  Left hand with swelling, tenderness, erythema, and increased warmth extending into left wrist. Full ROM in left wrist.  ROM in fingers limited by swelling. No open wounds noted.   Lymphadenopathy:    He has no cervical adenopathy.  Neurological: He is alert.  Skin: Skin is warm and dry. He is not diaphoretic. There is erythema.  Psychiatric: He has a normal mood and affect. His behavior is normal.  Nursing note and vitals reviewed.        ED Treatments / Results  Labs (all labs ordered are listed, but only abnormal results are displayed) Labs Reviewed  BASIC METABOLIC PANEL - Abnormal; Notable for the following:       Result Value   Sodium 133 (*)    Glucose, Bld 104 (*)    All other components within normal limits  CBC WITH DIFFERENTIAL/PLATELET - Abnormal; Notable for the following:    RBC 3.88 (*)    Hemoglobin 12.5 (*)    HCT 36.4 (*)    Platelets 443 (*)    All other components within normal limits    EKG  EKG Interpretation None       Radiology Dg Wrist Complete Left  Result Date: 11/19/2016 CLINICAL DATA:  Swelling and pain about the left wrist and hand since a spider bite at the base of the little finger 3 days ago. Initial encounter. EXAM: LEFT WRIST - COMPLETE 3+ VIEW COMPARISON:  None. FINDINGS: Soft tissue swelling is seen about the wrist. No soft tissue gas collection. No acute bony or joint abnormality. No bony destructive change or periosteal reaction. 0.2 cm radiopaque foreign body in the webspace between the thumb and index finger is likely chronic. IMPRESSION: Soft tissue swelling without soft tissue gas. No acute bony abnormality. Electronically Signed   By: Inge Rise M.D.   On: 11/19/2016 12:57   Dg Hand Complete Left  Result Date: 11/19/2016 CLINICAL DATA:  Swelling and pain about the left wrist and hand since a spider bite the base of the little finger 3 days ago. Initial encounter. EXAM: LEFT HAND - COMPLETE 3+ VIEW COMPARISON:  None. FINDINGS: Soft tissues of the hand are markedly swollen. No soft tissue gas collection is seen. A 0.2 cm in diameter radiopaque  foreign body in the webspace between the thumb and index finger is likely chronic. Joint space narrowing and osteophytosis are seen at the IP joint of the thumb and DIP joint of the index finger. DIP joint of the index finger appears ankylosed. IMPRESSION: Marked soft tissue swelling diffusely about the hand. Negative for soft tissue gas or evidence of osteomyelitis. Small radiopaque foreign body in the webspace between the thumb and index finger is likely chronic. Osteoarthritis IP joint of the thumb. Osteoarthritis and possible ankylosis of the DIP joint of the index finger worrisome for inflammatory arthropathy such as erosive osteoarthritis. Visualization is somewhat limited due to the patient's limited mobility. Electronically Signed   By: Inge Rise M.D.   On: 11/19/2016 12:55    Procedures Procedures (including critical care time)  Medications Ordered  in ED Medications  sodium chloride 0.9 % bolus 500 mL (0 mLs Intravenous Stopped 11/19/16 1337)  ketorolac (TORADOL) 30 MG/ML injection 30 mg (30 mg Intravenous Given 11/19/16 1201)  cephALEXin (KEFLEX) capsule 500 mg (500 mg Oral Given 11/19/16 1342)     Initial Impression / Assessment and Plan / ED Course  I have reviewed the triage vital signs and the nursing notes.  Pertinent labs & imaging results that were available during my care of the patient were reviewed by me and considered in my medical decision making (see chart for details).     Patient presents with swelling to the left hand; progression of swelling consistent with cellulitis. Patient is nontoxic appearing, afebrile, not tachycardic, not tachypneic, not hypotensive, and is in no apparent distress. Patient has no signs of sepsis at this time. Close PCP follow-up. Antibiotic prescribed. The patient was given instructions for home care as well as strict return precautions. Patient voices understanding of these instructions, accepts the plan, and is comfortable with  discharge.    Findings and plan of care discussed with Zenovia Jarred, MD. Dr. Thomasene Lot personally evaluated and examined this patient.   Vitals:   11/19/16 1104 11/19/16 1206 11/19/16 1315  BP: (!) 178/122 (!) 186/117 (!) 173/100  Pulse: 83 73 64  Resp: 15 16 16   Temp: 98.3 F (36.8 C)    TempSrc: Oral    SpO2: 100% 100% 100%   Hypertension noted. Patient states he has not yet taken his anti-hypertensive meds today. No symptoms of hypertensive emergency.  Final Clinical Impressions(s) / ED Diagnoses   Final diagnoses:  Cellulitis of left upper extremity    New Prescriptions Discharge Medication List as of 11/19/2016  1:31 PM    START taking these medications   Details  cephALEXin (KEFLEX) 500 MG capsule Take 1 capsule (500 mg total) by mouth 4 (four) times daily., Starting Fri 11/19/2016, Until Fri 11/26/2016, Print         Ariyanah Aguado C, PA-C 11/20/16 1140    Macarthur Critchley, MD 11/23/16 1555

## 2016-11-19 NOTE — ED Notes (Signed)
EDP made aware of patient's blood pressure.

## 2016-11-19 NOTE — ED Triage Notes (Signed)
Pt reports being bitten by something on his left hand Wednesday, believe it was a spider, now has redness and swelling around bite. Denies fevers at home.

## 2016-11-21 ENCOUNTER — Encounter (HOSPITAL_COMMUNITY): Payer: Self-pay

## 2016-11-21 ENCOUNTER — Inpatient Hospital Stay (HOSPITAL_COMMUNITY)
Admission: EM | Admit: 2016-11-21 | Discharge: 2016-11-25 | DRG: 603 | Disposition: A | Discharge status: Home / Self Care | Payer: No Typology Code available for payment source | Attending: Nephrology | Admitting: Nephrology

## 2016-11-21 ENCOUNTER — Emergency Department (HOSPITAL_COMMUNITY): Payer: No Typology Code available for payment source

## 2016-11-21 DIAGNOSIS — I10 Essential (primary) hypertension: Secondary | ICD-10-CM | POA: Diagnosis present

## 2016-11-21 DIAGNOSIS — M7989 Other specified soft tissue disorders: Secondary | ICD-10-CM

## 2016-11-21 DIAGNOSIS — L03114 Cellulitis of left upper limb: Principal | ICD-10-CM | POA: Diagnosis present

## 2016-11-21 DIAGNOSIS — S60562A Insect bite (nonvenomous) of left hand, initial encounter: Secondary | ICD-10-CM | POA: Diagnosis present

## 2016-11-21 DIAGNOSIS — W57XXXA Bitten or stung by nonvenomous insect and other nonvenomous arthropods, initial encounter: Secondary | ICD-10-CM | POA: Diagnosis present

## 2016-11-21 DIAGNOSIS — M109 Gout, unspecified: Secondary | ICD-10-CM | POA: Diagnosis present

## 2016-11-21 DIAGNOSIS — Z79899 Other long term (current) drug therapy: Secondary | ICD-10-CM

## 2016-11-21 DIAGNOSIS — M1A0411 Idiopathic chronic gout, right hand, with tophus (tophi): Secondary | ICD-10-CM | POA: Diagnosis not present

## 2016-11-21 DIAGNOSIS — N179 Acute kidney failure, unspecified: Secondary | ICD-10-CM

## 2016-11-21 LAB — COMPREHENSIVE METABOLIC PANEL
ALT: 9 U/L — ABNORMAL LOW (ref 17–63)
AST: 14 U/L — ABNORMAL LOW (ref 15–41)
Albumin: 3.6 g/dL (ref 3.5–5.0)
Alkaline Phosphatase: 49 U/L (ref 38–126)
Anion gap: 11 (ref 5–15)
BUN: 8 mg/dL (ref 6–20)
CO2: 20 mmol/L — ABNORMAL LOW (ref 22–32)
Calcium: 9.3 mg/dL (ref 8.9–10.3)
Chloride: 103 mmol/L (ref 101–111)
Creatinine, Ser: 1.02 mg/dL (ref 0.61–1.24)
GFR calc Af Amer: >60 mL/min (ref >60)
GFR calc non Af Amer: >60 mL/min (ref >60)
Glucose, Bld: 129 mg/dL — ABNORMAL HIGH (ref 65–99)
Potassium: 3.4 mmol/L — ABNORMAL LOW (ref 3.5–5.1)
Sodium: 134 mmol/L — ABNORMAL LOW (ref 135–145)
Total Bilirubin: 1.1 mg/dL (ref 0.3–1.2)
Total Protein: 7.4 g/dL (ref 6.5–8.1)

## 2016-11-21 LAB — CBC
HCT: 31.4 % — ABNORMAL LOW (ref 39.0–52.0)
Hemoglobin: 10.9 g/dL — ABNORMAL LOW (ref 13.0–17.0)
MCH: 32.1 pg (ref 26.0–34.0)
MCHC: 34.7 g/dL (ref 30.0–36.0)
MCV: 92.4 fL (ref 78.0–100.0)
Platelets: 403 10*3/uL — ABNORMAL HIGH (ref 150–400)
RBC: 3.4 MIL/uL — ABNORMAL LOW (ref 4.22–5.81)
RDW: 13.1 % (ref 11.5–15.5)
WBC: 10.5 10*3/uL (ref 4.0–10.5)

## 2016-11-21 LAB — CBC WITH DIFFERENTIAL/PLATELET
Basophils Absolute: 0 10*3/uL (ref 0.0–0.1)
Basophils Relative: 0 %
Eosinophils Absolute: 0 10*3/uL (ref 0.0–0.7)
Eosinophils Relative: 0 %
HCT: 35.6 % — ABNORMAL LOW (ref 39.0–52.0)
Hemoglobin: 12.2 g/dL — ABNORMAL LOW (ref 13.0–17.0)
Lymphocytes Relative: 11 %
Lymphs Abs: 1.4 10*3/uL (ref 0.7–4.0)
MCH: 31.8 pg (ref 26.0–34.0)
MCHC: 34.3 g/dL (ref 30.0–36.0)
MCV: 92.7 fL (ref 78.0–100.0)
Monocytes Absolute: 0.9 10*3/uL (ref 0.1–1.0)
Monocytes Relative: 7 %
Neutro Abs: 9.7 10*3/uL — ABNORMAL HIGH (ref 1.7–7.7)
Neutrophils Relative %: 82 %
Platelets: 438 10*3/uL — ABNORMAL HIGH (ref 150–400)
RBC: 3.84 MIL/uL — ABNORMAL LOW (ref 4.22–5.81)
RDW: 13.1 % (ref 11.5–15.5)
WBC: 11.9 10*3/uL — ABNORMAL HIGH (ref 4.0–10.5)

## 2016-11-21 LAB — CREATININE, SERUM
Creatinine, Ser: 0.93 mg/dL (ref 0.61–1.24)
GFR calc Af Amer: >60 mL/min (ref >60)
GFR calc non Af Amer: >60 mL/min (ref >60)

## 2016-11-21 MED ORDER — ONDANSETRON HCL 4 MG PO TABS: 4.0000 mg | ORAL_TABLET | Freq: Four times a day (QID) | ORAL | Status: DC | PRN

## 2016-11-21 MED ORDER — ONDANSETRON HCL 4 MG/2ML IJ SOLN: 4.0000 mg | Freq: Four times a day (QID) | INTRAMUSCULAR | Status: DC | PRN

## 2016-11-21 MED ORDER — POTASSIUM CHLORIDE IN NACL 40-0.9 MEQ/L-% IV SOLN
INTRAVENOUS | Status: DC
  Administered 2016-11-21 – 2016-11-23 (×4): 100 mL/h via INTRAVENOUS
  Filled 2016-11-21 (×6): qty 1000

## 2016-11-21 MED ORDER — VANCOMYCIN HCL IN DEXTROSE 1-5 GM/200ML-% IV SOLN
1000.0000 mg | Freq: Once | INTRAVENOUS | Status: AC
  Administered 2016-11-21: 1000 mg via INTRAVENOUS
  Filled 2016-11-21: qty 200

## 2016-11-21 MED ORDER — OXYCODONE-ACETAMINOPHEN 7.5-325 MG PO TABS
1.0000 | ORAL_TABLET | ORAL | Status: DC | PRN
  Administered 2016-11-21 – 2016-11-24 (×7): 1 via ORAL
  Filled 2016-11-21 (×7): qty 1

## 2016-11-21 MED ORDER — VANCOMYCIN HCL IN DEXTROSE 750-5 MG/150ML-% IV SOLN
750.0000 mg | Freq: Two times a day (BID) | INTRAVENOUS | Status: DC
  Administered 2016-11-21 – 2016-11-22 (×2): 750 mg via INTRAVENOUS
  Filled 2016-11-21 (×2): qty 150

## 2016-11-21 MED ORDER — IBUPROFEN 600 MG PO TABS
600.0000 mg | ORAL_TABLET | Freq: Four times a day (QID) | ORAL | Status: DC
  Administered 2016-11-21 – 2016-11-22 (×5): 600 mg via ORAL
  Filled 2016-11-21 (×5): qty 1

## 2016-11-21 MED ORDER — BISACODYL 5 MG PO TBEC: 5.0000 mg | DELAYED_RELEASE_TABLET | Freq: Every day | ORAL | Status: DC | PRN

## 2016-11-21 MED ORDER — VANCOMYCIN HCL IN DEXTROSE 1-5 GM/200ML-% IV SOLN: 1000.0000 mg | Freq: Once | INTRAVENOUS | Status: DC

## 2016-11-21 MED ORDER — LISINOPRIL 20 MG PO TABS
20.0000 mg | ORAL_TABLET | Freq: Every day | ORAL | Status: DC
  Administered 2016-11-21 – 2016-11-25 (×5): 20 mg via ORAL
  Filled 2016-11-21 (×6): qty 1

## 2016-11-21 MED ORDER — IOPAMIDOL (ISOVUE-300) INJECTION 61%
INTRAVENOUS | Status: AC
  Administered 2016-11-21: 75 mL
  Filled 2016-11-21: qty 75

## 2016-11-21 MED ORDER — KETOROLAC TROMETHAMINE 30 MG/ML IJ SOLN
30.0000 mg | Freq: Once | INTRAMUSCULAR | Status: AC
  Administered 2016-11-21: 30 mg via INTRAVENOUS
  Filled 2016-11-21: qty 1

## 2016-11-21 MED ORDER — PIPERACILLIN-TAZOBACTAM 3.375 G IVPB 30 MIN
3.3750 g | Freq: Once | INTRAVENOUS | Status: AC
  Administered 2016-11-21: 3.375 g via INTRAVENOUS
  Filled 2016-11-21: qty 50

## 2016-11-21 MED ORDER — ZOLPIDEM TARTRATE 5 MG PO TABS
5.0000 mg | ORAL_TABLET | Freq: Every evening | ORAL | Status: DC | PRN
  Administered 2016-11-23 – 2016-11-24 (×2): 5 mg via ORAL
  Filled 2016-11-21 (×2): qty 1

## 2016-11-21 MED ORDER — OXYCODONE-ACETAMINOPHEN 7.5-325 MG PO TABS
2.0000 | ORAL_TABLET | ORAL | Status: DC | PRN
  Administered 2016-11-22 – 2016-11-23 (×2): 2 via ORAL
  Filled 2016-11-21 (×2): qty 2

## 2016-11-21 MED ORDER — VANCOMYCIN HCL 500 MG IV SOLR
500.0000 mg | Freq: Once | INTRAVENOUS | Status: AC
  Administered 2016-11-21: 500 mg via INTRAVENOUS
  Filled 2016-11-21: qty 500

## 2016-11-21 MED ORDER — ENOXAPARIN SODIUM 40 MG/0.4ML ~~LOC~~ SOLN
40.0000 mg | SUBCUTANEOUS | Status: DC
  Administered 2016-11-21 – 2016-11-23 (×3): 40 mg via SUBCUTANEOUS
  Filled 2016-11-21 (×4): qty 0.4

## 2016-11-21 MED ORDER — PIPERACILLIN-TAZOBACTAM 3.375 G IVPB
3.3750 g | Freq: Three times a day (TID) | INTRAVENOUS | Status: DC
  Administered 2016-11-21 – 2016-11-24 (×8): 3.375 g via INTRAVENOUS
  Filled 2016-11-21 (×9): qty 50

## 2016-11-21 MED ORDER — HYDROCHLOROTHIAZIDE 25 MG PO TABS
25.0000 mg | ORAL_TABLET | Freq: Every day | ORAL | Status: DC
  Administered 2016-11-21 – 2016-11-22 (×2): 25 mg via ORAL
  Filled 2016-11-21 (×2): qty 1

## 2016-11-21 MED ORDER — MORPHINE SULFATE (PF) 4 MG/ML IV SOLN
4.0000 mg | Freq: Once | INTRAVENOUS | Status: AC
  Administered 2016-11-21: 4 mg via INTRAVENOUS
  Filled 2016-11-21: qty 1

## 2016-11-21 NOTE — Consult Note (Signed)
Reason for Consult: hand infection Referring Physician: ER  CC:I got bit by a spider  HPI:  Brad [name] is an 61 y.o.  male who presents with  Pain and swelling of Left hand after spider bite.  Pt was seen on Friday 2d ago for same incident, placed on keflex, currently no better/ ? worse      .   Pain is rated at  7  /10 and is described as dull.  Pain is constant.  Pain is made better by rest/immobilization, worse with motion.   Associated signs/symptoms:warmth, swelling, decreased function; h/o gout Previous treatment:    Past Medical History:  Diagnosis Date  . Gout   . Hypertension     History reviewed. No pertinent surgical history.  Family History  Problem Relation Age of Onset  . Diabetes Mother     Social History:  reports that he has been smoking Cigarettes.  He has been smoking about 0.15 packs per day. He has never used smokeless tobacco. He reports that he drinks alcohol. He reports that he does not use drugs.  Allergies: No Known Allergies  Medications: I have reviewed the patient's current medications.  Results for orders placed or performed during the hospital encounter of 11/21/16 (from the past 48 hour(s))  CBC with Differential/Platelet     Status: Abnormal   Collection Time: 11/21/16  8:06 AM  Result Value Ref Range   WBC 11.9 (H) 4.0 - 10.5 K/uL   RBC 3.84 (L) 4.22 - 5.81 MIL/uL   Hemoglobin 12.2 (L) 13.0 - 17.0 g/dL   HCT 35.6 (L) 39.0 - 52.0 %   MCV 92.7 78.0 - 100.0 fL   MCH 31.8 26.0 - 34.0 pg   MCHC 34.3 30.0 - 36.0 g/dL   RDW 13.1 11.5 - 15.5 %   Platelets 438 (H) 150 - 400 K/uL   Neutrophils Relative % 82 %   Neutro Abs 9.7 (H) 1.7 - 7.7 K/uL   Lymphocytes Relative 11 %   Lymphs Abs 1.4 0.7 - 4.0 K/uL   Monocytes Relative 7 %   Monocytes Absolute 0.9 0.1 - 1.0 K/uL   Eosinophils Relative 0 %   Eosinophils Absolute 0.0 0.0 - 0.7 K/uL   Basophils Relative 0 %   Basophils Absolute 0.0 0.0 - 0.1 K/uL  Comprehensive metabolic panel      Status: Abnormal   Collection Time: 11/21/16  8:06 AM  Result Value Ref Range   Sodium 134 (L) 135 - 145 mmol/L   Potassium 3.4 (L) 3.5 - 5.1 mmol/L   Chloride 103 101 - 111 mmol/L   CO2 20 (L) 22 - 32 mmol/L   Glucose, Bld 129 (H) 65 - 99 mg/dL   BUN 8 6 - 20 mg/dL   Creatinine, Ser 1.02 0.61 - 1.24 mg/dL   Calcium 9.3 8.9 - 10.3 mg/dL   Total Protein 7.4 6.5 - 8.1 g/dL   Albumin 3.6 3.5 - 5.0 g/dL   AST 14 (L) 15 - 41 U/L   ALT 9 (L) 17 - 63 U/L   Alkaline Phosphatase 49 38 - 126 U/L   Total Bilirubin 1.1 0.3 - 1.2 mg/dL   GFR calc non Af Amer >60 >60 mL/min   GFR calc Af Amer >60 >60 mL/min    Comment: (NOTE) The eGFR has been calculated using the CKD EPI equation. This calculation has not been validated in all clinical situations. eGFR's persistently <60 mL/min signify possible Chronic Kidney Disease.    Anion  gap 11 5 - 15  CBC     Status: Abnormal   Collection Time: 11/21/16  1:45 PM  Result Value Ref Range   WBC 10.5 4.0 - 10.5 K/uL   RBC 3.40 (L) 4.22 - 5.81 MIL/uL   Hemoglobin 10.9 (L) 13.0 - 17.0 g/dL   HCT 31.4 (L) 39.0 - 52.0 %   MCV 92.4 78.0 - 100.0 fL   MCH 32.1 26.0 - 34.0 pg   MCHC 34.7 30.0 - 36.0 g/dL   RDW 13.1 11.5 - 15.5 %   Platelets 403 (H) 150 - 400 K/uL  Creatinine, serum     Status: None   Collection Time: 11/21/16  1:45 PM  Result Value Ref Range   Creatinine, Ser 0.93 0.61 - 1.24 mg/dL   GFR calc non Af Amer >60 >60 mL/min   GFR calc Af Amer >60 >60 mL/min    Comment: (NOTE) The eGFR has been calculated using the CKD EPI equation. This calculation has not been validated in all clinical situations. eGFR's persistently <60 mL/min signify possible Chronic Kidney Disease.     Ct Hand Left W Contrast  Result Date: 11/21/2016 CLINICAL DATA:  Redness, pain and swelling of the left hand since a spider bite 11/17/2016. EXAM: CT OF THE UPPER LEFT EXTREMITY WITH CONTRAST TECHNIQUE: Multidetector CT imaging of the upper left extremity was  performed according to the standard protocol following intravenous contrast administration. COMPARISON:  Plain films left hand and wrist 11/19/2016 P CONTRAST:  75 ml ISOVUE-300 IOPAMIDOL (ISOVUE-300) INJECTION 61% FINDINGS: Bones/Joint/Cartilage No bony destructive change or periosteal reaction is identified. 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 Possible ankylosis of the DIP joint of the index finger described on report of prior exam was due to projection. The joint is patent. There is no fracture or dislocation. Ligaments Suboptimally assessed by CT. Muscles and Tendons Intact. No intramuscular fluid collection is identified. No gas dissecting its soft tissue planes is seen. Soft tissues Soft tissues are diffusely swollen. No abscess is identified. No soft tissue gas is seen. A radiopaque foreign body in the subcutaneous tissues measuring 0.4 cm in diameter at the lateral metaphysis of the first metacarpal is presumably chronic. There is no surrounding stranding or fluid collection. The patient has extensive calcification of the triangular fibrocartilage. Calcifications are also identified about the second, third and fifth MCP joints and scattered PIP and DIP joints, most notable at the PIP joints of the ring and long fingers and DIP joint of the index finger. Multifocal calcifications are also identified tendons. IMPRESSION: Soft tissue swelling about the hand is consistent with cellulitis. Negative for abscess, septic joint or evidence of osteomyelitis. 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 Electronically Signed   By: Inge Rise M.D.   On: 11/21/2016 11:39    Pertinent items are noted in HPI. 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  The patient understands that additional treatment may be necessary. Will allow pt to eat today, if hand worse in the am, please make npo   Myisha Pickerel C Sandee Bernath 11/21/2016, 3:22 PM

## 2016-11-21 NOTE — ED Provider Notes (Addendum)
[facility] DEPT Provider Note   CSN: [id number] Arrival date & time: 11/21/16  5573     History   Chief Complaint Chief Complaint  Patient presents with  . Hand Pain  . Insect Bite    HPI Brad [name] is a 61 y.o. male.  HPI Patient states he had a witnessed spider bite to the dorsum of the left hand on Wednesday. States he initially felt a sting and then brushed the spider off. Thursday he developed left hand swelling and redness. This is progressed and extended up the dorsum of the hand to the left wrist. States he's having pain in this area. He's had no draining abscesses. Was seen yesterday for similar symptoms and prescribed Keflex. States he's taken 4 doses of this. He's had worsening swelling and redness. He denies any constitutional symptoms including fever or chills. He has limited mobility of the left hand due to swelling and pain. Past Medical History:  Diagnosis Date  . Gout   . Hypertension     There are no active problems to display for this patient.   History reviewed. No pertinent surgical history.     Home Medications    Prior to Admission medications   Medication Sig Start Date End Date Taking? Authorizing Provider  acetaminophen (TYLENOL) 500 MG tablet Take 500 mg by mouth every 6 (six) hours as needed for moderate pain.   Yes [provider]  cephALEXin (KEFLEX) 500 MG capsule Take 1 capsule (500 mg total) by mouth 4 (four) times daily. 11/19/16 11/26/16 Yes Joy, Shawn C, PA-C  ibuprofen (ADVIL,MOTRIN) 200 MG tablet Take 400 mg by mouth every 6 (six) hours as needed for moderate pain.   Yes [provider]  PRESCRIPTION MEDICATION Take 1 tablet by mouth daily. Blood pressure medication. Unknown name, pt has not filled at his pharmacy   Yes [provider]  indomethacin (INDOCIN) 25 MG capsule Take 1 capsule (25 mg total) by mouth 3 (three) times daily as needed. Patient not taking: Reported on 11/19/2016 08/01/15   Dowless,  Dondra Spry, PA-C  oxyCODONE-acetaminophen (PERCOCET/ROXICET) 5-325 MG tablet Take 1 tablet by mouth every 4 (four) hours as needed for severe pain. Patient not taking: Reported on 11/19/2016 07/30/15   Virgel Manifold, MD  oxyCODONE-acetaminophen (PERCOCET/ROXICET) 5-325 MG tablet Take 2 tablets by mouth every 4 (four) hours as needed for severe pain. Patient not taking: Reported on 11/19/2016 08/01/15   Dowless, Aldona Bar Tripp, PA-C  predniSONE (DELTASONE) 20 MG tablet Take 2 tablets (40 mg total) by mouth daily. Patient not taking: Reported on 11/19/2016 07/30/15   Virgel Manifold, MD    Family History Family History  Problem Relation Age of Onset  . Diabetes Mother     Social History Social History  Substance Use Topics  . Smoking status: Current Every Day Smoker    Packs/day: 0.15    Types: Cigarettes  . Smokeless tobacco: Never Used  . Alcohol use Yes     Comment: 16 ounce beer a day     Allergies   Patient has no known allergies.   Review of Systems Review of Systems  Constitutional: Negative for chills and fever.  Respiratory: Negative for cough and shortness of breath.   Cardiovascular: Negative for chest pain.  Gastrointestinal: Negative for abdominal pain, nausea and vomiting.  Musculoskeletal: Negative for arthralgias and joint swelling.  Skin: Positive for color change and wound.  Neurological: Negative for weakness and numbness.     Physical Exam Updated Vital Signs  BP (!) 184/104   Pulse 88   Temp 98.4 F (36.9 C)   Resp 18   SpO2 98%   Physical Exam  Constitutional: He is oriented to person, place, and time. He appears well-developed and well-nourished.  HENT:  Head: Normocephalic and atraumatic.  Mouth/Throat: Oropharynx is clear and moist.  Eyes: EOM are normal. Pupils are equal, round, and reactive to light.  Neck: Normal range of motion. Neck supple.  Cardiovascular: Normal rate and regular rhythm.   Pulmonary/Chest: Effort normal and breath  sounds normal.  Abdominal: Soft. Bowel sounds are normal. There is no tenderness. There is no rebound and no guarding.  Musculoskeletal: Normal range of motion. He exhibits edema and tenderness.  Patient with erythema, swelling and tenderness to the ulnar surface of the dorsum of the left hand. No discrete palpable masses. Erythema extends up to the left wrist. Patient has some mild decreased range of motion due to swelling. Distal cap refill is normal.  Neurological: He is alert and oriented to person, place, and time.  Skin: Skin is warm and dry. No rash noted. There is erythema.  Psychiatric: He has a normal mood and affect. His behavior is normal.  Nursing note and vitals reviewed.    ED Treatments / Results  Labs (all labs ordered are listed, but only abnormal results are displayed) Labs Reviewed  CBC WITH DIFFERENTIAL/PLATELET - Abnormal; Notable for the following:       Result Value   WBC 11.9 (*)    RBC 3.84 (*)    Hemoglobin 12.2 (*)    HCT 35.6 (*)    Platelets 438 (*)    Neutro Abs 9.7 (*)    All other components within normal limits  COMPREHENSIVE METABOLIC PANEL - Abnormal; Notable for the following:    Sodium 134 (*)    Potassium 3.4 (*)    CO2 20 (*)    Glucose, Bld 129 (*)    AST 14 (*)    ALT 9 (*)    All other components within normal limits    EKG  EKG Interpretation None       Radiology Dg Wrist Complete Left  Result Date: 11/19/2016 CLINICAL DATA:  Swelling and pain about the left wrist and hand since a spider bite at the base of the little finger 3 days ago. Initial encounter. EXAM: LEFT WRIST - COMPLETE 3+ VIEW COMPARISON:  None. FINDINGS: Soft tissue swelling is seen about the wrist. No soft tissue gas collection. No acute bony or joint abnormality. No bony destructive change or periosteal reaction. 0.2 cm radiopaque foreign body in the webspace between the thumb and index finger is likely chronic. IMPRESSION: Soft tissue swelling without soft tissue  gas. No acute bony abnormality. Electronically Signed   By: Inge Rise M.D.   On: 11/19/2016 12:57   Ct Hand Left W Contrast  Result Date: 11/21/2016 CLINICAL DATA:  Redness, pain and swelling of the left hand since a spider bite 11/17/2016. EXAM: CT OF THE UPPER LEFT EXTREMITY WITH CONTRAST TECHNIQUE: Multidetector CT imaging of the upper left extremity was performed according to the standard protocol following intravenous contrast administration. COMPARISON:  Plain films left hand and wrist 11/19/2016 P CONTRAST:  75 ml ISOVUE-300 IOPAMIDOL (ISOVUE-300) INJECTION 61% FINDINGS: Bones/Joint/Cartilage No bony destructive change or periosteal reaction is identified. 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 Possible ankylosis of the DIP joint of the index  finger described on report of prior exam was due to projection. The joint is patent. There is no fracture or dislocation. Ligaments Suboptimally assessed by CT. Muscles and Tendons Intact. No intramuscular fluid collection is identified. No gas dissecting its soft tissue planes is seen. Soft tissues Soft tissues are diffusely swollen. No abscess is identified. No soft tissue gas is seen. A radiopaque foreign body in the subcutaneous tissues measuring 0.4 cm in diameter at the lateral metaphysis of the first metacarpal is presumably chronic. There is no surrounding stranding or fluid collection. The patient has extensive calcification of the triangular fibrocartilage. Calcifications are also identified about the second, third and fifth MCP joints and scattered PIP and DIP joints, most notable at the PIP joints of the ring and long fingers and DIP joint of the index finger. Multifocal calcifications are also identified tendons. IMPRESSION: Soft tissue swelling about the hand is consistent with cellulitis. Negative for abscess, septic joint or evidence of osteomyelitis. Multifocal soft  tissue calcifications as described above compatible with CPPD or other crystal deposition disease such as a hydroxyapatite deposition disease. Electronically Signed   By: Inge Rise M.D.   On: 11/21/2016 11:39   Dg Hand Complete Left  Result Date: 11/19/2016 CLINICAL DATA:  Swelling and pain about the left wrist and hand since a spider bite the base of the little finger 3 days ago. Initial encounter. EXAM: LEFT HAND - COMPLETE 3+ VIEW COMPARISON:  None. FINDINGS: Soft tissues of the hand are markedly swollen. No soft tissue gas collection is seen. A 0.2 cm in diameter radiopaque foreign body in the webspace between the thumb and index finger is likely chronic. Joint space narrowing and osteophytosis are seen at the IP joint of the thumb and DIP joint of the index finger. DIP joint of the index finger appears ankylosed. IMPRESSION: Marked soft tissue swelling diffusely about the hand. Negative for soft tissue gas or evidence of osteomyelitis. Small radiopaque foreign body in the webspace between the thumb and index finger is likely chronic. Osteoarthritis IP joint of the thumb. Osteoarthritis and possible ankylosis of the DIP joint of the index finger worrisome for inflammatory arthropathy such as erosive osteoarthritis. Visualization is somewhat limited due to the patient's limited mobility. Electronically Signed   By: Inge Rise M.D.   On: 11/19/2016 12:55    Procedures Procedures (including critical care time)  Medications Ordered in ED Medications  vancomycin (VANCOCIN) IVPB 1000 mg/200 mL premix (0 mg Intravenous Stopped 11/21/16 0943)  morphine 4 MG/ML injection 4 mg (4 mg Intravenous Given 11/21/16 0801)  iopamidol (ISOVUE-300) 61 % injection (75 mLs  Contrast Given 11/21/16 1036)     Initial Impression / Assessment and Plan / ED Course  I have reviewed the triage vital signs and the nursing notes.  Pertinent labs & imaging results that were available during my care of the patient  were reviewed by me and considered in my medical decision making (see chart for details).     CT without evidence of abscess. Consistent with cellulitis. Given dose of IV vancomycin in the emergency department worried given outpatient failure, discussed with hospitalist and will admit. Patient continues to be well-appearing.  Final Clinical Impressions(s) / ED Diagnoses   Final diagnoses:  Swelling of left hand  Cellulitis of left hand    New Prescriptions New Prescriptions   No medications on file     Julianne Rice, MD 11/21/16 1225    Julianne Rice, MD 12/10/16 [phone]

## 2016-11-21 NOTE — ED Notes (Signed)
Attempted report 

## 2016-11-21 NOTE — ED Triage Notes (Signed)
Pt states has spider bite to L hand 3 days ago. Pt states seen here for same, given abx. Pt states increased swelling and pain today. Pt also with open wound to R second finger. Pt states old boil bust on finger.

## 2016-11-21 NOTE — H&P (Signed)
History and Physical    Brad [name] YJE:[id number] DOB: Apr 17, 1956 DOA: 11/21/2016  PCP: [name], MD  Patient coming from: Home  I have personally briefly reviewed patient's old medical records in [place]  Chief Complaint:   HPI: Brad [name] is a 61 y.o. male with medical history significant of gout and hypertension who presented to the emergency department from home complaining of a spider bite which occurred on Wednesday. He was seen yesterday in the emergency department had x-rays done which were unremarkable and was prescribed Keflex. He has taken 4 doses however his hand has continued to swell. He has difficulty moving his fingers and he has pain above the area of his wrist. The area is swollen and red and hot pain medications at home have not made it any better. It is worsened with movement. Pain is constant and sharp. Pain has been present and worsening since Wednesday. He has no associated fevers chills nausea or vomiting. CT scan was obtained in the emergency department which did not show any abscess. Due to poor response to oral Keflex patient was given a dose of vancomycin IV in the emergency department. Of concern is that his white blood cell count has been increasing and his blood pressure is very high and out of control. Patient will be admitted to the hospital for further evaluation and management of left hand cellulitis with a secondary diagnosis of hypertension out of control.   Review of Systems: As per HPI otherwise 10 point review of systems negative.   Review of Systems  Constitutional: Negative for chills and fever.  HENT: Negative for ear discharge, ear pain and hearing loss.   Eyes: Negative for blurred vision and double vision.  Respiratory: Negative for cough and hemoptysis.   Cardiovascular: Negative for chest pain and palpitations.  Gastrointestinal: Negative for abdominal pain and heartburn.  Genitourinary: Negative for dysuria and urgency.    Musculoskeletal: Positive for joint pain. Negative for myalgias and neck pain.  Skin: Negative for itching and rash.  Neurological: Negative for dizziness, tingling and headaches.  Endo/Heme/Allergies: Negative for environmental allergies. Does not bruise/bleed easily.  Psychiatric/Behavioral: Negative for depression and suicidal ideas.    Past Medical History:  Diagnosis Date  . Gout   . Hypertension     History reviewed. No pertinent surgical history.   reports that he has been smoking Cigarettes.  He has been smoking about 0.15 packs per day. He has never used smokeless tobacco. He reports that he drinks alcohol. He reports that he does not use drugs. Patient states that he smokes 3-4 cigarettes a day. No Known Allergies  Family History  Problem Relation Age of Onset  . Diabetes Mother     Prior to Admission medications   Medication Sig Start Date End Date Taking? Authorizing Provider  acetaminophen (TYLENOL) 500 MG tablet Take 500 mg by mouth every 6 (six) hours as needed for moderate pain.   Yes [provider]  cephALEXin (KEFLEX) 500 MG capsule Take 1 capsule (500 mg total) by mouth 4 (four) times daily. 11/19/16 11/26/16 Yes Joy, Shawn C, PA-C  ibuprofen (ADVIL,MOTRIN) 200 MG tablet Take 400 mg by mouth every 6 (six) hours as needed for moderate pain.   Yes [provider]  PRESCRIPTION MEDICATION Take 1 tablet by mouth daily. Blood pressure medication. Unknown name, pt has not filled at his pharmacy   Yes [provider]  indomethacin (INDOCIN) 25 MG capsule Take 1 capsule (25 mg total) by mouth  3 (three) times daily as needed. Patient not taking: Reported on 11/19/2016 08/01/15   Dowless, Dondra Spry, PA-C  oxyCODONE-acetaminophen (PERCOCET/ROXICET) 5-325 MG tablet Take 1 tablet by mouth every 4 (four) hours as needed for severe pain. Patient not taking: Reported on 11/19/2016 07/30/15   Virgel Manifold, MD  oxyCODONE-acetaminophen (PERCOCET/ROXICET)  5-325 MG tablet Take 2 tablets by mouth every 4 (four) hours as needed for severe pain. Patient not taking: Reported on 11/19/2016 08/01/15   Dowless, Aldona Bar Tripp, PA-C  predniSONE (DELTASONE) 20 MG tablet Take 2 tablets (40 mg total) by mouth daily. Patient not taking: Reported on 11/19/2016 07/30/15   Virgel Manifold, MD    Physical Exam: Vitals:   11/21/16 0329 11/21/16 0717 11/21/16 0722 11/21/16 1200  BP: (!) 191/118 (!) 184/104    Pulse: (!) 105  88   Resp: 16  18   Temp: 98.4 F (36.9 C)     SpO2: 97%  98%   Weight:    70.3 kg (155 lb)  Height:    6' (1.829 m)    Constitutional: NAD, calm, comfortable Vitals:   11/21/16 0329 11/21/16 0717 11/21/16 0722 11/21/16 1200  BP: (!) 191/118 (!) 184/104    Pulse: (!) 105  88   Resp: 16  18   Temp: 98.4 F (36.9 C)     SpO2: 97%  98%   Weight:    70.3 kg (155 lb)  Height:    6' (1.829 m)   Eyes: PERRL, lids and conjunctivae normal ENMT: Mucous membranes are moist. Posterior pharynx clear of any exudate or lesions.Normal dentition.  Neck: normal, supple, no masses, no thyromegaly Respiratory: clear to auscultation bilaterally, no wheezing, no crackles. Normal respiratory effort. No accessory muscle use.  Cardiovascular: Regular rate and rhythm, no murmurs / rubs / gallops. No extremity edema. 2+ pedal pulses. No carotid bruits.  Abdomen: no tenderness, no masses palpated. No hepatosplenomegaly. Bowel sounds positive.  Musculoskeletal: no clubbing / cyanosis. No joint deformity upper and lower extremities. Good ROM, no contractures. Normal muscle tone.  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 The area has an eschar. No  ulcers. No induration.  Erythema of the left hand that starts at the fingertips and and halfway up the forearm. There is swelling with difficulty bending his fingers. Neurologic: CN 2-12 grossly intact. Sensation intact, DTR normal. Strength 5/5 in all 4.  Psychiatric: Normal  judgment and insight. Alert and oriented x 3. Normal mood.     Labs on Admission: I have personally reviewed following labs and imaging studies  CBC:  Recent Labs Lab 11/19/16 1158 11/21/16 0806  WBC 10.5 11.9*  NEUTROABS 7.6 9.7*  HGB 12.5* 12.2*  HCT 36.4* 35.6*  MCV 93.8 92.7  PLT 443* 749*   Basic Metabolic Panel:  Recent Labs Lab 11/19/16 1158 11/21/16 0806  NA 133* 134*  K 3.6 3.4*  CL 102 103  CO2 24 20*  GLUCOSE 104* 129*  BUN 7 8  CREATININE 1.14 1.02  CALCIUM 9.4 9.3   GFR: Estimated Creatinine Clearance: 77.5 mL/min (by C-G formula based on SCr of 1.02 mg/dL). Liver Function Tests:  Recent Labs Lab 11/21/16 0806  AST 14*  ALT 9*  ALKPHOS 49  BILITOT 1.1  PROT 7.4  ALBUMIN 3.6   No results for input(s): LIPASE, AMYLASE in the last 168 hours. No results for input(s): AMMONIA in the last 168 hours. Coagulation Profile: No results for input(s): INR,  PROTIME in the last 168 hours. Cardiac Enzymes: No results for input(s): CKTOTAL, CKMB, CKMBINDEX, TROPONINI in the last 168 hours. BNP (last 3 results) No results for input(s): PROBNP in the last 8760 hours. HbA1C: No results for input(s): HGBA1C in the last 72 hours. CBG: No results for input(s): GLUCAP in the last 168 hours. Lipid Profile: No results for input(s): CHOL, HDL, LDLCALC, TRIG, CHOLHDL, LDLDIRECT in the last 72 hours. Thyroid Function Tests: No results for input(s): TSH, T4TOTAL, FREET4, T3FREE, THYROIDAB in the last 72 hours. Anemia Panel: No results for input(s): VITAMINB12, FOLATE, FERRITIN, TIBC, IRON, RETICCTPCT in the last 72 hours. Urine analysis: No results found for: COLORURINE, APPEARANCEUR, [place], [place], [place], [place], [place], [place], PROTEINUR, UROBILINOGEN, NITRITE, LEUKOCYTESUR  Radiological Exams on Admission: Ct Hand Left W Contrast  Result Date: 11/21/2016 CLINICAL DATA:  Redness, pain and swelling of the left hand since a spider bite 11/17/2016.  EXAM: CT OF THE UPPER LEFT EXTREMITY WITH CONTRAST TECHNIQUE: Multidetector CT imaging of the upper left extremity was performed according to the standard protocol following intravenous contrast administration. COMPARISON:  Plain films left hand and wrist 11/19/2016 P CONTRAST:  75 ml ISOVUE-300 IOPAMIDOL (ISOVUE-300) INJECTION 61% FINDINGS: Bones/Joint/Cartilage No bony destructive change or periosteal reaction is identified. 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 Possible ankylosis of the DIP joint of the index finger described on report of prior exam was due to projection. The joint is patent. There is no fracture or dislocation. Ligaments Suboptimally assessed by CT. Muscles and Tendons Intact. No intramuscular fluid collection is identified. No gas dissecting its soft tissue planes is seen. Soft tissues Soft tissues are diffusely swollen. No abscess is identified. No soft tissue gas is seen. A radiopaque foreign body in the subcutaneous tissues measuring 0.4 cm in diameter at the lateral metaphysis of the first metacarpal is presumably chronic. There is no surrounding stranding or fluid collection. The patient has extensive calcification of the triangular fibrocartilage. Calcifications are also identified about the second, third and fifth MCP joints and scattered PIP and DIP joints, most notable at the PIP joints of the ring and long fingers and DIP joint of the index finger. Multifocal calcifications are also identified tendons. IMPRESSION: Soft tissue swelling about the hand is consistent with cellulitis. Negative for abscess, septic joint or evidence of osteomyelitis. 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 Electronically Signed   By: Inge Rise M.D.   On: 11/21/2016 11:39    Assessment/Plan Active  Problems:   Cellulitis of hand, left   Gout   Essential hypertension     1. Left hand cellulitis: Possibly due to a spider bite. Patient with difficulty moving his fingers swelling is very concerning. We'll admit the patient into the hospital. We'll switch him from Keflex to vancomycin plus Zosyn. Will elevate the patient's hand and discuss with orthopedics  2. Gout: Patient has not been taking any medications recently. 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 He will be getting a lot of nonsteroidals for inflammation while hospitalized for his left hand cellulitis. Will monitor renal function closely.  3. Essential hypertension: Blood pressure markedly out of control. I have added hydrochlorothiazide and lisinopril to his regimen. I will also add IV hydralazine when necessary. Patient does not know his home medications and may have been  taking a blood pressure medicine.  DVT prophylaxis: Lovenox Code Status: Full Family Communication: No one present at this time Disposition Plan: Home in 1 -2 days Consults called:  Dr. Lenon Curt orthopedic hand surgery Admission status: Observation   Lady Deutscher MD [facility] Pager [phone]  If 7PM-7AM, please contact night-coverage [email or web address] Password [email or web address]  11/21/2016, 1:36 PM

## 2016-11-21 NOTE — Progress Notes (Signed)
Pharmacy Antibiotic Note  Brad [name] is a 61 y.o. male admitted on 11/21/2016 with cellulitis after a spider bite.  Pharmacy has been consulted for Vancomycin and Zosyn dosing.  He has received 1gm at 08:00 Am of IV Vanc and Zosyn 3.375 x 1 has been ordered.  PTA he was given a prescription of Cephalexin and stated that he took 4 doses of this without good response.  He is back today for worsening and progression of his cellulitis.  Plan: - Give additional 500mg  IV Vancomycin for a total load of 1500mg .  Will begin maintenance of 750mg  IV every 12 hours - Zosyn 3.375gm every 8 hours - Monitor renal function, cultures and clinical response.  Height: 6' (182.9 cm) (per patient) Weight: 155 lb (70.3 kg) (per patient) IBW/kg (Calculated) : 77.6  Temp (24hrs), Avg:98.4 F (36.9 C), Min:98.4 F (36.9 C), Max:98.4 F (36.9 C)   Recent Labs Lab 11/19/16 1158 11/21/16 0806  WBC 10.5 11.9*  CREATININE 1.14 1.02    Estimated Creatinine Clearance: 77.5 mL/min (by C-G formula based on SCr of 1.02 mg/dL).    No Known Allergies  Antimicrobials this admission: 6/3 Vanc. >> 6/3 Zosyn >>   Rober Minion, PharmD., MS Clinical Pharmacist Pager:  [phone] Thank you for allowing pharmacy to be part of this patients care team. 11/21/2016 12:50 PM

## 2016-11-21 NOTE — ED Notes (Signed)
Report called  

## 2016-11-21 NOTE — ED Notes (Signed)
Patient transported to CT 

## 2016-11-22 DIAGNOSIS — M1A0421 Idiopathic chronic gout, left hand, with tophus (tophi): Secondary | ICD-10-CM | POA: Diagnosis not present

## 2016-11-22 DIAGNOSIS — L03114 Cellulitis of left upper limb: Secondary | ICD-10-CM | POA: Diagnosis present

## 2016-11-22 DIAGNOSIS — S60562A Insect bite (nonvenomous) of left hand, initial encounter: Secondary | ICD-10-CM | POA: Diagnosis present

## 2016-11-22 DIAGNOSIS — I10 Essential (primary) hypertension: Secondary | ICD-10-CM | POA: Diagnosis present

## 2016-11-22 DIAGNOSIS — W57XXXA Bitten or stung by nonvenomous insect and other nonvenomous arthropods, initial encounter: Secondary | ICD-10-CM | POA: Diagnosis present

## 2016-11-22 DIAGNOSIS — N179 Acute kidney failure, unspecified: Secondary | ICD-10-CM | POA: Diagnosis present

## 2016-11-22 DIAGNOSIS — M1A0411 Idiopathic chronic gout, right hand, with tophus (tophi): Secondary | ICD-10-CM | POA: Diagnosis not present

## 2016-11-22 DIAGNOSIS — Z79899 Other long term (current) drug therapy: Secondary | ICD-10-CM | POA: Diagnosis not present

## 2016-11-22 DIAGNOSIS — M109 Gout, unspecified: Secondary | ICD-10-CM | POA: Diagnosis present

## 2016-11-22 LAB — BASIC METABOLIC PANEL
Anion gap: 8 (ref 5–15)
BUN: 11 mg/dL (ref 6–20)
CO2: 21 mmol/L — ABNORMAL LOW (ref 22–32)
Calcium: 8.4 mg/dL — ABNORMAL LOW (ref 8.9–10.3)
Chloride: 104 mmol/L (ref 101–111)
Creatinine, Ser: 1.33 mg/dL — ABNORMAL HIGH (ref 0.61–1.24)
GFR calc Af Amer: >60 mL/min (ref >60)
GFR calc non Af Amer: 57 mL/min — ABNORMAL LOW (ref >60)
Glucose, Bld: 127 mg/dL — ABNORMAL HIGH (ref 65–99)
Potassium: 3.5 mmol/L (ref 3.5–5.1)
Sodium: 133 mmol/L — ABNORMAL LOW (ref 135–145)

## 2016-11-22 LAB — CBC WITH DIFFERENTIAL/PLATELET
Basophils Absolute: 0 10*3/uL (ref 0.0–0.1)
Basophils Relative: 0 %
Eosinophils Absolute: 0.2 10*3/uL (ref 0.0–0.7)
Eosinophils Relative: 2 %
HCT: 31 % — ABNORMAL LOW (ref 39.0–52.0)
Hemoglobin: 10.5 g/dL — ABNORMAL LOW (ref 13.0–17.0)
Lymphocytes Relative: 15 %
Lymphs Abs: 1.3 10*3/uL (ref 0.7–4.0)
MCH: 31.5 pg (ref 26.0–34.0)
MCHC: 33.9 g/dL (ref 30.0–36.0)
MCV: 93.1 fL (ref 78.0–100.0)
Monocytes Absolute: 0.6 10*3/uL (ref 0.1–1.0)
Monocytes Relative: 8 %
Neutro Abs: 6.2 10*3/uL (ref 1.7–7.7)
Neutrophils Relative %: 75 %
Platelets: 384 10*3/uL (ref 150–400)
RBC: 3.33 MIL/uL — ABNORMAL LOW (ref 4.22–5.81)
RDW: 13.2 % (ref 11.5–15.5)
WBC: 8.3 10*3/uL (ref 4.0–10.5)

## 2016-11-22 LAB — VANCOMYCIN, TROUGH: Vancomycin Tr: 18 ug/mL (ref 15–20)

## 2016-11-22 LAB — MAGNESIUM: Magnesium: 1.7 mg/dL (ref 1.7–2.4)

## 2016-11-22 LAB — HIV ANTIBODY (ROUTINE TESTING W REFLEX): HIV Screen 4th Generation wRfx: NONREACTIVE

## 2016-11-22 MED ORDER — IBUPROFEN 600 MG PO TABS
600.0000 mg | ORAL_TABLET | Freq: Four times a day (QID) | ORAL | Status: DC | PRN
  Administered 2016-11-22 – 2016-11-24 (×4): 600 mg via ORAL
  Filled 2016-11-22 (×4): qty 1

## 2016-11-22 MED ORDER — SODIUM CHLORIDE 0.9 % IV SOLN
500.0000 mg | Freq: Two times a day (BID) | INTRAVENOUS | Status: DC
  Administered 2016-11-23 (×3): 500 mg via INTRAVENOUS
  Filled 2016-11-22 (×4): qty 500

## 2016-11-22 NOTE — Care Management Note (Signed)
Case Management Note  Patient Details  Name: Brad [name] MRN: [id number] Date of Birth: 06/30/1955  Subjective/Objective:                    Action/Plan:   Expected Discharge Date:                  Expected Discharge Plan:  Home/Self Care  In-House Referral:     Discharge planning Services     Post Acute Care Choice:    Choice offered to:     DME Arranged:    DME Agency:     HH Arranged:    HH Agency:     Status of Service:  In process, will continue to follow  If discussed at Long Length of Stay Meetings, dates discussed:    Additional Comments:  Marilu Favre, RN 11/22/2016, 11:01 AM

## 2016-11-22 NOTE — Progress Notes (Signed)
Pharmacy Antibiotic Note  Brad [name] is a 61 y.o. male admitted on 11/21/2016 with cellulitis.  Pharmacy has been consulted for Vancocin and Zosyn dosing.  Plan: Change vancomycin to 500mg  IV every 12 hours for calculated trough of ~12.  Goal trough 10-15 mcg/mL.  Zosyn remains appropriate.  Height: 6' (182.9 cm) Weight: 155 lb (70.3 kg) IBW/kg (Calculated) : 77.6  Temp (24hrs), Avg:98.4 F (36.9 C), Min:98.1 F (36.7 C), Max:98.7 F (37.1 C)   Recent Labs Lab 11/19/16 1158 11/21/16 0806 11/21/16 1345 11/22/16 0350 11/22/16 2225  WBC 10.5 11.9* 10.5 8.3  --   CREATININE 1.14 1.02 0.93 1.33*  --   VANCOTROUGH  --   --   --   --  18    Estimated Creatinine Clearance: 59.5 mL/min (A) (by C-G formula based on SCr of 1.33 mg/dL (H)).    No Known Allergies   Thank you for allowing pharmacy to be a part of this patient's care.  Wynona Neat, PharmD, BCPS  11/22/2016 11:14 PM

## 2016-11-22 NOTE — Progress Notes (Signed)
PROGRESS NOTE    Brad [name]  YQM:[id number] DOB: [birth year] DOA: 11/21/2016 PCP: [name], MD   Outpatient Specialists:     Brief Narrative:  Brad [name] is a 61 y.o. male with medical history significant of gout and hypertension who presented to the emergency department from home complaining of a spider bite which occurred on Wednesday. He was seen yesterday in the emergency department had x-rays done which were unremarkable and was prescribed Keflex. He has taken 4 doses however his hand has continued to swell. He has difficulty moving his fingers and he has pain above the area of his wrist. The area is swollen and red and hot pain medications at home have not made it any better. It is worsened with movement. Pain is constant and sharp. Pain has been present and worsening since Wednesday. He has no associated fevers chills nausea or vomiting. CT scan was obtained in the emergency department which did not show any abscess. Due to poor response to oral Keflex patient was given a dose of vancomycin IV in the emergency department. Of concern is that his white blood cell count has been increasing and his blood pressure is very high and out of control. Patient will be admitted to the hospital for further evaluation and management of left hand cellulitis with a secondary diagnosis of hypertension out of control.    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 -recheck BMP in AM  AKI -watch while on vanc and NSAIDS  Essential hypertension: D/c HCTZ -recheck BMP in AM  DVT prophylaxis:  Lovenox   Code  Status: Full Code   Family Communication:   Disposition Plan:     Consultants:   ortho    Subjective: Swelling has improved only mildly but pain is not worse  Objective: Vitals:   11/21/16 1345 11/21/16 1700 11/21/16 2218 11/22/16 0521  BP: (!) 161/102 (!) 167/84 120/73 128/70  Pulse: 74 (!) 59 (!) 59 64  Resp:  16 18 18   Temp:  98.6 F (37 C) 98.7 F (37.1 C) 98.1 F (36.7 C)  TempSrc:  Oral Oral   SpO2: 98% 100% 99% 98%  Weight:  70.3 kg (155 lb)    Height:  6' (1.829 m)      Intake/Output Summary (Last 24 hours) at 11/22/16 1141 Last data filed at 11/22/16 0908  Gross per 24 hour  Intake             2535 ml  Output              800 ml  Net             1735 ml   Filed Weights   11/21/16 1200 11/21/16 1700  Weight: 70.3 kg (155 lb) 70.3 kg (155 lb)    Examination:  General exam: Appears calm and comfortable  Respiratory system: Clear to auscultation. Respiratory effort normal. Cardiovascular system: S1 & S2 heard, RRR. No JVD, murmurs, rubs, gallops or clicks. No pedal edema. Gastrointestinal system: Abdomen is nondistended, soft and nontender. No organomegaly or masses felt. Normal bowel sounds heard. Central nervous system:  Alert and oriented. No focal neurological deficits. Extremities: Symmetric 5 x 5 power. Skin: ruptured tophaceous gout on right finger, left hand swollen to wrist Psychiatry: Judgement and insight appear normal. Mood & affect appropriate.     Data Reviewed: I have personally reviewed following labs and imaging studies  CBC:  Recent Labs Lab 11/19/16 1158 11/21/16 0806 11/21/16 1345 11/22/16 0350  WBC 10.5 11.9* 10.5 8.3  NEUTROABS 7.6 9.7*  --  6.2  HGB 12.5* 12.2* 10.9* 10.5*  HCT 36.4* 35.6* 31.4* 31.0*  MCV 93.8 92.7 92.4 93.1  PLT 443* 438* 403* 144   Basic Metabolic Panel:  Recent Labs Lab 11/19/16 1158 11/21/16 0806 11/21/16 1345 11/22/16 0350  NA 133* 134*  --  133*  K 3.6 3.4*  --  3.5  CL 102 103   --  104  CO2 24 20*  --  21*  GLUCOSE 104* 129*  --  127*  BUN 7 8  --  11  CREATININE 1.14 1.02 0.93 1.33*  CALCIUM 9.4 9.3  --  8.4*  MG  --   --   --  1.7   GFR: Estimated Creatinine Clearance: 59.5 mL/min (A) (by C-G formula based on SCr of 1.33 mg/dL (H)). Liver Function Tests:  Recent Labs Lab 11/21/16 0806  AST 14*  ALT 9*  ALKPHOS 49  BILITOT 1.1  PROT 7.4  ALBUMIN 3.6   No results for input(s): LIPASE, AMYLASE in the last 168 hours. No results for input(s): AMMONIA in the last 168 hours. Coagulation Profile: No results for input(s): INR, PROTIME in the last 168 hours. Cardiac Enzymes: No results for input(s): CKTOTAL, CKMB, CKMBINDEX, TROPONINI in the last 168 hours. BNP (last 3 results) No results for input(s): PROBNP in the last 8760 hours. HbA1C: No results for input(s): HGBA1C in the last 72 hours. CBG: No results for input(s): GLUCAP in the last 168 hours. Lipid Profile: No results for input(s): CHOL, HDL, LDLCALC, TRIG, CHOLHDL, LDLDIRECT in the last 72 hours. Thyroid Function Tests: No results for input(s): TSH, T4TOTAL, FREET4, T3FREE, THYROIDAB in the last 72 hours. Anemia Panel: No results for input(s): VITAMINB12, FOLATE, FERRITIN, TIBC, IRON, RETICCTPCT in the last 72 hours. Urine analysis: No results found for: COLORURINE, APPEARANCEUR, LABSPEC, [place], GLUCOSEU, HGBUR, BILIRUBINUR, KETONESUR, PROTEINUR, UROBILINOGEN, NITRITE, LEUKOCYTESUR   )No results found for this or any previous visit (from the past 240 hour(s)).    Anti-infectives    Start     Dose/Rate Route Frequency Ordered Stop   11/21/16 2300  vancomycin (VANCOCIN) IVPB 750 mg/150 ml premix     750 mg 150 mL/hr over 60 Minutes Intravenous Every 12 hours 11/21/16 1301     11/21/16 2200  piperacillin-tazobactam (ZOSYN) IVPB 3.375 g     3.375 g 12.5 mL/hr over 240 Minutes Intravenous Every 8 hours 11/21/16 1301     11/21/16 1315  vancomycin (VANCOCIN) 500 mg in sodium chloride 0.9  % 100 mL IVPB     500 mg 100 mL/hr over 60 Minutes Intravenous  Once 11/21/16 1301 11/21/16 1824   11/21/16 1245  piperacillin-tazobactam (ZOSYN) IVPB 3.375 g     3.375 g 100 mL/hr over 30 Minutes Intravenous  Once 11/21/16 1238 11/21/16 1525   11/21/16 1245  vancomycin (VANCOCIN) IVPB 1000 mg/200 mL premix  Status:  Discontinued     1,000 mg 200 mL/hr over 60 Minutes Intravenous  Once 11/21/16 1238 11/21/16 1246   11/21/16 0745  vancomycin (VANCOCIN) IVPB 1000 mg/200 mL premix  1,000 mg 200 mL/hr over 60 Minutes Intravenous  Once 11/21/16 1610 11/21/16 9604       Radiology Studies: Ct Hand Left W Contrast  Result Date: 11/21/2016 CLINICAL DATA:  Redness, pain and swelling of the left hand since a spider bite 11/17/2016. EXAM: CT OF THE UPPER LEFT EXTREMITY WITH CONTRAST TECHNIQUE: Multidetector CT imaging of the upper left extremity was performed according to the standard protocol following intravenous contrast administration. COMPARISON:  Plain films left hand and wrist 11/19/2016 P CONTRAST:  75 ml ISOVUE-300 IOPAMIDOL (ISOVUE-300) INJECTION 61% FINDINGS: Bones/Joint/Cartilage No bony destructive change or periosteal reaction is identified. 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 Possible ankylosis of the DIP joint of the index finger described on report of prior exam was due to projection. The joint is patent. There is no fracture or dislocation. Ligaments Suboptimally assessed by CT. Muscles and Tendons Intact. No intramuscular fluid collection is identified. No gas dissecting its soft tissue planes is seen. Soft tissues Soft tissues are diffusely swollen. No abscess is identified. No soft tissue gas is seen. A radiopaque foreign body in the subcutaneous tissues measuring 0.4 cm in diameter at the lateral metaphysis of the first metacarpal is presumably chronic. There is no surrounding stranding or fluid  collection. The patient has extensive calcification of the triangular fibrocartilage. Calcifications are also identified about the second, third and fifth MCP joints and scattered PIP and DIP joints, most notable at the PIP joints of the ring and long fingers and DIP joint of the index finger. Multifocal calcifications are also identified tendons. IMPRESSION: Soft tissue swelling about the hand is consistent with cellulitis. Negative for abscess, septic joint or evidence of osteomyelitis. 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 Electronically Signed   By: Inge Rise M.D.   On: 11/21/2016 11:39        Scheduled Meds: . enoxaparin (LOVENOX) injection  40 mg Subcutaneous Q24H  . hydrochlorothiazide  25 mg Oral Daily  . ibuprofen  600 mg Oral Q6H  . lisinopril  20 mg Oral Daily   Continuous Infusions: . 0.9 % NaCl with KCl 40 mEq / L 100 mL/hr (11/22/16 0640)  . piperacillin-tazobactam (ZOSYN)  IV Stopped (11/22/16 1002)  . vancomycin 750 mg (11/22/16 1007)     LOS: 0 days    Time spent: 64 min    [place], DO [facility] Pager [phone]  If 7PM-7AM, please contact night-coverage [email or web address] Password [email or web address] 11/22/2016, 11:41 AM

## 2016-11-23 DIAGNOSIS — N179 Acute kidney failure, unspecified: Secondary | ICD-10-CM

## 2016-11-23 LAB — BASIC METABOLIC PANEL
Anion gap: 9 (ref 5–15)
BUN: 10 mg/dL (ref 6–20)
CO2: 22 mmol/L (ref 22–32)
Calcium: 8.5 mg/dL — ABNORMAL LOW (ref 8.9–10.3)
Chloride: 106 mmol/L (ref 101–111)
Creatinine, Ser: 1.27 mg/dL — ABNORMAL HIGH (ref 0.61–1.24)
GFR calc Af Amer: >60 mL/min (ref >60)
GFR calc non Af Amer: >60 mL/min (ref >60)
Glucose, Bld: 95 mg/dL (ref 65–99)
Potassium: 4.4 mmol/L (ref 3.5–5.1)
Sodium: 137 mmol/L (ref 135–145)

## 2016-11-23 LAB — CBC
HCT: 29.3 % — ABNORMAL LOW (ref 39.0–52.0)
Hemoglobin: 9.7 g/dL — ABNORMAL LOW (ref 13.0–17.0)
MCH: 31 pg (ref 26.0–34.0)
MCHC: 33.1 g/dL (ref 30.0–36.0)
MCV: 93.6 fL (ref 78.0–100.0)
Platelets: 414 10*3/uL — ABNORMAL HIGH (ref 150–400)
RBC: 3.13 MIL/uL — ABNORMAL LOW (ref 4.22–5.81)
RDW: 13.3 % (ref 11.5–15.5)
WBC: 7.3 10*3/uL (ref 4.0–10.5)

## 2016-11-23 NOTE — Progress Notes (Signed)
PROGRESS NOTE    Brad [name]  YKD:[id number] DOB: 04-Dec-1955 DOA: 11/21/2016 PCP: [name], MD   Outpatient Specialists:     Brief Narrative:  Brad [name] is a 61 y.o. male with medical history significant of gout and hypertension who presented to the emergency department from home complaining of a spider bite which occurred on Wednesday. He was seen yesterday in the emergency department had x-rays done which were unremarkable and was prescribed Keflex. He has taken 4 doses however his hand has continued to swell. He has difficulty moving his fingers and he has pain above the area of his wrist. The area is swollen and red and hot pain medications at home have not made it any better. It is worsened with movement. Pain is constant and sharp. Pain has been present and worsening since Wednesday. He has no associated fevers chills nausea or vomiting. CT scan was obtained in the emergency department which did not show any abscess. Due to poor response to oral Keflex patient was given a dose of vancomycin IV in the emergency department. Of concern is that his white blood cell count has been increasing and his blood pressure is very high and out of control. Patient will be admitted to the hospital for further evaluation and management of left hand cellulitis with a secondary diagnosis of hypertension out of control.    Assessment & Plan:   Active Problems:   Cellulitis of hand, left   Gout   Essential hypertension   Left hand cellulitis:  -switched from Keflex to vancomycin plus Zosyn (vanc per pharmacy-- will need close monitoring due to renal issues) -elevate the patient's hand  -seen by ortho-- no abcess collection currently- Dr. 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  AKI -watch while on vanc and NSAIDS D/c HCTZ -daily BMP  Essential hypertension: D/c HCTZ   DVT  prophylaxis:  Lovenox   Code Status: Full Code   Family Communication:   Disposition Plan:     Consultants:   [place]    Subjective: Swelling about the same in left hand  Objective: Vitals:   11/22/16 0521 11/22/16 1320 11/22/16 2225 11/23/16 0514  BP: 128/70 (!) 153/89 (!) 158/84 (!) 150/83  Pulse: 64 66 69 64  Resp: 18 18 18 18   Temp: 98.1 F (36.7 C) 98.4 F (36.9 C) 98.7 F (37.1 C) 97.7 F (36.5 C)  TempSrc:  Oral Oral Oral  SpO2: 98% 100% 98% 100%  Weight:      Height:        Intake/Output Summary (Last 24 hours) at 11/23/16 3976 Last data filed at 11/23/16 7341  Gross per 24 hour  Intake             3405 ml  Output             1300 ml  Net             2105 ml   Filed Weights   11/21/16 1200 11/21/16 1700  Weight: 70.3 kg (155 lb) 70.3 kg (155 lb)    Examination:  General exam: NAD Respiratory system: Clear, no wheezing Cardiovascular system: rrr Gastrointestinal system: +BS, soft Central nervous system: Alert and oriented. No focal neurological deficits. Skin: ruptured tophaceous gout on right index finger, left hand swollen to wrist- tight but not tender .     Data Reviewed: I have personally reviewed  following labs and imaging studies  CBC:  Recent Labs Lab 11/19/16 1158 11/21/16 0806 11/21/16 1345 11/22/16 0350 11/23/16 0408  WBC 10.5 11.9* 10.5 8.3 7.3  NEUTROABS 7.6 9.7*  --  6.2  --   HGB 12.5* 12.2* 10.9* 10.5* 9.7*  HCT 36.4* 35.6* 31.4* 31.0* 29.3*  MCV 93.8 92.7 92.4 93.1 93.6  PLT 443* 438* 403* 384 646*   Basic Metabolic Panel:  Recent Labs Lab 11/19/16 1158 11/21/16 0806 11/21/16 1345 11/22/16 0350 11/23/16 0408  NA 133* 134*  --  133* 137  K 3.6 3.4*  --  3.5 4.4  CL 102 103  --  104 106  CO2 24 20*  --  21* 22  GLUCOSE 104* 129*  --  127* 95  BUN 7 8  --  11 10  CREATININE 1.14 1.02 0.93 1.33* 1.27*  CALCIUM 9.4 9.3  --  8.4* 8.5*  MG  --   --   --  1.7  --    GFR: Estimated Creatinine  Clearance: 62.3 mL/min (A) (by C-G formula based on SCr of 1.27 mg/dL (H)). Liver Function Tests:  Recent Labs Lab 11/21/16 0806  AST 14*  ALT 9*  ALKPHOS 49  BILITOT 1.1  PROT 7.4  ALBUMIN 3.6   No results for input(s): LIPASE, AMYLASE in the last 168 hours. No results for input(s): AMMONIA in the last 168 hours. Coagulation Profile: No results for input(s): INR, PROTIME in the last 168 hours. Cardiac Enzymes: No results for input(s): CKTOTAL, CKMB, CKMBINDEX, TROPONINI in the last 168 hours. BNP (last 3 results) No results for input(s): PROBNP in the last 8760 hours. HbA1C: No results for input(s): HGBA1C in the last 72 hours. CBG: No results for input(s): GLUCAP in the last 168 hours. Lipid Profile: No results for input(s): CHOL, HDL, LDLCALC, TRIG, CHOLHDL, LDLDIRECT in the last 72 hours. Thyroid Function Tests: No results for input(s): TSH, T4TOTAL, FREET4, T3FREE, THYROIDAB in the last 72 hours. Anemia Panel: No results for input(s): VITAMINB12, FOLATE, FERRITIN, TIBC, IRON, RETICCTPCT in the last 72 hours. Urine analysis: No results found for: COLORURINE, APPEARANCEUR, LABSPEC, [place], GLUCOSEU, HGBUR, BILIRUBINUR, KETONESUR, PROTEINUR, UROBILINOGEN, NITRITE, LEUKOCYTESUR   )No results found for this or any previous visit (from the past 240 hour(s)).    Anti-infectives    Start     Dose/Rate Route Frequency Ordered Stop   11/23/16 0000  vancomycin (VANCOCIN) 500 mg in sodium chloride 0.9 % 100 mL IVPB     500 mg 100 mL/hr over 60 Minutes Intravenous Every 12 hours 11/22/16 2320     11/21/16 2300  vancomycin (VANCOCIN) IVPB 750 mg/150 ml premix  Status:  Discontinued     750 mg 150 mL/hr over 60 Minutes Intravenous Every 12 hours 11/21/16 1301 11/22/16 1349   11/21/16 2200  piperacillin-tazobactam (ZOSYN) IVPB 3.375 g     3.375 g 12.5 mL/hr over 240 Minutes Intravenous Every 8 hours 11/21/16 1301     11/21/16 1315  vancomycin (VANCOCIN) 500 mg in sodium  chloride 0.9 % 100 mL IVPB     500 mg 100 mL/hr over 60 Minutes Intravenous  Once 11/21/16 1301 11/21/16 1824   11/21/16 1245  piperacillin-tazobactam (ZOSYN) IVPB 3.375 g     3.375 g 100 mL/hr over 30 Minutes Intravenous  Once 11/21/16 1238 11/21/16 1525   11/21/16 1245  vancomycin (VANCOCIN) IVPB 1000 mg/200 mL premix  Status:  Discontinued     1,000 mg 200 mL/hr over 60 Minutes Intravenous  Once 11/21/16 1238 11/21/16 1246   11/21/16 0745  vancomycin (VANCOCIN) IVPB 1000 mg/200 mL premix     1,000 mg 200 mL/hr over 60 Minutes Intravenous  Once 11/21/16 0738 11/21/16 3291       Radiology Studies: Ct Hand Left W Contrast  Result Date: 11/21/2016 CLINICAL DATA:  Redness, pain and swelling of the left hand since a spider bite 11/17/2016. EXAM: CT OF THE UPPER LEFT EXTREMITY WITH CONTRAST TECHNIQUE: Multidetector CT imaging of the upper left extremity was performed according to the standard protocol following intravenous contrast administration. COMPARISON:  Plain films left hand and wrist 11/19/2016 P CONTRAST:  75 ml ISOVUE-300 IOPAMIDOL (ISOVUE-300) INJECTION 61% FINDINGS: Bones/Joint/Cartilage No bony destructive change or periosteal reaction is identified. 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 Possible ankylosis of the DIP joint of the index finger described on report of prior exam was due to projection. The joint is patent. There is no fracture or dislocation. Ligaments Suboptimally assessed by CT. Muscles and Tendons Intact. No intramuscular fluid collection is identified. No gas dissecting its soft tissue planes is seen. Soft tissues Soft tissues are diffusely swollen. No abscess is identified. No soft tissue gas is seen. A radiopaque foreign body in the subcutaneous tissues measuring 0.4 cm in diameter at the lateral metaphysis of the first metacarpal is presumably chronic. There is no surrounding stranding or  fluid collection. The patient has extensive calcification of the triangular fibrocartilage. Calcifications are also identified about the second, third and fifth MCP joints and scattered PIP and DIP joints, most notable at the PIP joints of the ring and long fingers and DIP joint of the index finger. Multifocal calcifications are also identified tendons. IMPRESSION: Soft tissue swelling about the hand is consistent with cellulitis. Negative for abscess, septic joint or evidence of osteomyelitis. 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 Electronically Signed   By: Inge Rise M.D.   On: 11/21/2016 11:39        Scheduled Meds: . enoxaparin (LOVENOX) injection  40 mg Subcutaneous Q24H  . lisinopril  20 mg Oral Daily   Continuous Infusions: . piperacillin-tazobactam (ZOSYN)  IV 3.375 g (11/23/16 0620)  . vancomycin Stopped (11/23/16 0142)     LOS: 1 day    Time spent: 25 min    [place], DO [facility] Pager [phone]  If 7PM-7AM, please contact night-coverage [email or web address] Password [email or web address] 11/23/2016, 8:21 AM

## 2016-11-23 NOTE — Plan of Care (Signed)
Problem: Safety: Goal: Ability to remain free from injury will improve Outcome: Progressing No incidence of falls during this admission. Call bell within reach. Bed in low and locked position. Patient alert and oriented. 3/4 siderails in place. Clean and clear environment maintained. Patient verbalized understanding of safety instruction.

## 2016-11-24 DIAGNOSIS — N179 Acute kidney failure, unspecified: Secondary | ICD-10-CM

## 2016-11-24 LAB — CBC
HCT: 30.1 % — ABNORMAL LOW (ref 39.0–52.0)
Hemoglobin: 10.2 g/dL — ABNORMAL LOW (ref 13.0–17.0)
MCH: 31.5 pg (ref 26.0–34.0)
MCHC: 33.9 g/dL (ref 30.0–36.0)
MCV: 92.9 fL (ref 78.0–100.0)
Platelets: 489 10*3/uL — ABNORMAL HIGH (ref 150–400)
RBC: 3.24 MIL/uL — ABNORMAL LOW (ref 4.22–5.81)
RDW: 13.1 % (ref 11.5–15.5)
WBC: 7.7 10*3/uL (ref 4.0–10.5)

## 2016-11-24 LAB — URIC ACID: Uric Acid, Serum: 6.2 mg/dL (ref 4.4–7.6)

## 2016-11-24 LAB — BASIC METABOLIC PANEL
Anion gap: 12 (ref 5–15)
BUN: 6 mg/dL (ref 6–20)
CO2: 22 mmol/L (ref 22–32)
Calcium: 9.1 mg/dL (ref 8.9–10.3)
Chloride: 102 mmol/L (ref 101–111)
Creatinine, Ser: 1.28 mg/dL — ABNORMAL HIGH (ref 0.61–1.24)
GFR calc Af Amer: >60 mL/min (ref >60)
GFR calc non Af Amer: 60 mL/min — ABNORMAL LOW (ref >60)
Glucose, Bld: 76 mg/dL (ref 65–99)
Potassium: 4.2 mmol/L (ref 3.5–5.1)
Sodium: 136 mmol/L (ref 135–145)

## 2016-11-24 MED ORDER — CLINDAMYCIN PHOSPHATE 600 MG/50ML IV SOLN
600.0000 mg | Freq: Three times a day (TID) | INTRAVENOUS | Status: DC
  Administered 2016-11-24 – 2016-11-25 (×4): 600 mg via INTRAVENOUS
  Filled 2016-11-24 (×5): qty 50

## 2016-11-24 MED ORDER — AMLODIPINE BESYLATE 5 MG PO TABS
5.0000 mg | ORAL_TABLET | Freq: Every day | ORAL | Status: DC
  Administered 2016-11-24 – 2016-11-25 (×2): 5 mg via ORAL
  Filled 2016-11-24 (×2): qty 1

## 2016-11-24 MED ORDER — OXYCODONE-ACETAMINOPHEN 7.5-325 MG PO TABS
1.0000 | ORAL_TABLET | ORAL | Status: DC | PRN
  Administered 2016-11-24: 2 via ORAL
  Administered 2016-11-24 (×2): 1 via ORAL
  Administered 2016-11-25: 2 via ORAL
  Filled 2016-11-24: qty 1
  Filled 2016-11-24: qty 2
  Filled 2016-11-24: qty 1
  Filled 2016-11-24: qty 2

## 2016-11-24 MED ORDER — PREDNISONE 20 MG PO TABS
40.0000 mg | ORAL_TABLET | Freq: Every day | ORAL | Status: DC
  Administered 2016-11-24 – 2016-11-25 (×2): 40 mg via ORAL
  Filled 2016-11-24 (×2): qty 2

## 2016-11-24 MED ORDER — HYDRALAZINE HCL 20 MG/ML IJ SOLN
5.0000 mg | Freq: Once | INTRAMUSCULAR | Status: AC
  Administered 2016-11-24: 5 mg via INTRAVENOUS
  Filled 2016-11-24: qty 1

## 2016-11-24 NOTE — Progress Notes (Addendum)
PROGRESS NOTE    Brad [name]  LTJ:[id number] DOB: 1956/04/30 DOA: 11/21/2016 PCP: [name], MD   Outpatient Specialists:     Brief Narrative:  Brad [name] is a 61 y.o. male with medical history significant of gout and hypertension who presented to the emergency department from home complaining of a spider bite which occurred on Wednesday. He was seen yesterday in the emergency department had x-rays done which were unremarkable and was prescribed Keflex. He has taken 4 doses however his hand has continued to swell. He has difficulty moving his fingers and he has pain above the area of his wrist. The area is swollen and red and hot pain medications at home have not made it any better. It is worsened with movement. Pain is constant and sharp. Pain has been present and worsening since Wednesday. He has no associated fevers chills nausea or vomiting. CT scan was obtained in the emergency department which did not show any abscess. Due to poor response to oral Keflex patient was given a dose of vancomycin IV in the emergency department. Seen by Dr. Lenon Curt.  Swelling is starting to go down with elevation.   Assessment & Plan:   Active Problems:   Cellulitis of hand, left   Gout   Essential hypertension   AKI (acute kidney injury) ([place])   Left hand cellulitis:  -switched from Keflex to vancomycin plus Zosyn and now over to clindamycin -elevate the patient's hand  -seen by ortho-- no abcess collection currently- Dr. 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-- have ordered sling in order to be able to elevate hand while working   DVT  prophylaxis:  Lovenox   Code Status: Full Code   Family Communication:   Disposition Plan:     Consultants:   OrthoLenon Curt    Subjective: Swelling has lessened, pain still bad at times  Objective: Vitals:   11/23/16 1510 11/23/16 2115 11/24/16 0500 11/24/16 1449  BP: (!) 159/85 (!) 177/96 (!) 164/90 (!) 147/88  Pulse: 65 73 70 86  Resp: 18 18 18 18   Temp: 97.7 F (36.5 C) 98.8 F (37.1 C) 98.2 F (36.8 C) 98.1 F (36.7 C)  TempSrc: Oral Oral Oral Oral  SpO2: 100% 100% 100% 100%  Weight:      Height:        Intake/Output Summary (Last 24 hours) at 11/24/16 1608 Last data filed at 11/24/16 1450  Gross per 24 hour  Intake             2040 ml  Output              800 ml  Net             1240 ml   Filed Weights   11/21/16 1200 11/21/16 1700  Weight: 70.3 kg (155 lb) 70.3 kg (155 lb)    Examination:  General exam: in bed, NAD Respiratory system: Clear, no wheezing Cardiovascular system: rrr Gastrointestinal system: +BS, soft Central nervous system: Alert and oriented. No focal neurological deficits. Skin: ruptured area of gout on right hand, left hand swollen to wrist but some improvement since yesterday .  Data Reviewed: I have personally reviewed following labs and imaging studies  CBC:  Recent Labs Lab 11/19/16 1158 11/21/16 0806 11/21/16 1345 11/22/16 0350 11/23/16 0408 11/24/16 0809  WBC 10.5 11.9* 10.5 8.3 7.3 7.7  NEUTROABS 7.6 9.7*  --  6.2  --   --   HGB 12.5* 12.2* 10.9* 10.5* 9.7* 10.2*  HCT 36.4* 35.6* 31.4* 31.0* 29.3* 30.1*  MCV 93.8 92.7 92.4 93.1 93.6 92.9  PLT 443* 438* 403* 384 414* 585*   Basic Metabolic Panel:  Recent Labs Lab 11/19/16 1158 11/21/16 0806 11/21/16 1345 11/22/16 0350 11/23/16 0408 11/24/16 0809  NA 133* 134*  --  133* 137 136  K 3.6 3.4*  --  3.5 4.4 4.2  CL 102 103  --  104 106 102  CO2 24 20*  --  21* 22 22  GLUCOSE 104* 129*  --  127* 95 76  BUN 7 8  --  11 10 6   CREATININE 1.14 1.02  0.93 1.33* 1.27* 1.28*  CALCIUM 9.4 9.3  --  8.4* 8.5* 9.1  MG  --   --   --  1.7  --   --    GFR: Estimated Creatinine Clearance: 61.8 mL/min (A) (by C-G formula based on SCr of 1.28 mg/dL (H)). Liver Function Tests:  Recent Labs Lab 11/21/16 0806  AST 14*  ALT 9*  ALKPHOS 49  BILITOT 1.1  PROT 7.4  ALBUMIN 3.6   No results for input(s): LIPASE, AMYLASE in the last 168 hours. No results for input(s): AMMONIA in the last 168 hours. Coagulation Profile: No results for input(s): INR, PROTIME in the last 168 hours. Cardiac Enzymes: No results for input(s): CKTOTAL, CKMB, CKMBINDEX, TROPONINI in the last 168 hours. BNP (last 3 results) No results for input(s): PROBNP in the last 8760 hours. HbA1C: No results for input(s): HGBA1C in the last 72 hours. CBG: No results for input(s): GLUCAP in the last 168 hours. Lipid Profile: No results for input(s): CHOL, HDL, LDLCALC, TRIG, CHOLHDL, LDLDIRECT in the last 72 hours. Thyroid Function Tests: No results for input(s): TSH, T4TOTAL, FREET4, T3FREE, THYROIDAB in the last 72 hours. Anemia Panel: No results for input(s): VITAMINB12, FOLATE, FERRITIN, TIBC, IRON, RETICCTPCT in the last 72 hours. Urine analysis: No results found for: COLORURINE, APPEARANCEUR, LABSPEC, [place], GLUCOSEU, HGBUR, BILIRUBINUR, KETONESUR, PROTEINUR, UROBILINOGEN, NITRITE, LEUKOCYTESUR   )No results found for this or any previous visit (from the past 240 hour(s)).    Anti-infectives    Start     Dose/Rate Route Frequency Ordered Stop   11/24/16 1000  clindamycin (CLEOCIN) IVPB 600 mg     600 mg 100 mL/hr over 30 Minutes Intravenous Every 8 hours 11/24/16 0844     11/23/16 0000  vancomycin (VANCOCIN) 500 mg in sodium chloride 0.9 % 100 mL IVPB  Status:  Discontinued     500 mg 100 mL/hr over 60 Minutes Intravenous Every 12 hours 11/22/16 2320 11/24/16 0844   11/21/16 2300  vancomycin (VANCOCIN) IVPB 750 mg/150 ml premix  Status:  Discontinued     750  mg 150 mL/hr over 60 Minutes Intravenous Every 12 hours 11/21/16 1301 11/22/16 1349   11/21/16 2200  piperacillin-tazobactam (ZOSYN) IVPB 3.375 g  Status:  Discontinued     3.375 g 12.5 mL/hr over 240 Minutes Intravenous Every 8 hours 11/21/16 1301 11/24/16 0844   11/21/16 1315  vancomycin (VANCOCIN) 500 mg in sodium chloride 0.9 % 100 mL IVPB     500 mg 100 mL/hr over  60 Minutes Intravenous  Once 11/21/16 1301 11/21/16 1824   11/21/16 1245  piperacillin-tazobactam (ZOSYN) IVPB 3.375 g     3.375 g 100 mL/hr over 30 Minutes Intravenous  Once 11/21/16 1238 11/21/16 1525   11/21/16 1245  vancomycin (VANCOCIN) IVPB 1000 mg/200 mL premix  Status:  Discontinued     1,000 mg 200 mL/hr over 60 Minutes Intravenous  Once 11/21/16 1238 11/21/16 1246   11/21/16 0745  vancomycin (VANCOCIN) IVPB 1000 mg/200 mL premix     1,000 mg 200 mL/hr over 60 Minutes Intravenous  Once 11/21/16 0738 11/21/16 0943       Radiology Studies: No results found.      Scheduled Meds: . amLODipine  5 mg Oral Daily  . enoxaparin (LOVENOX) injection  40 mg Subcutaneous Q24H  . lisinopril  20 mg Oral Daily   Continuous Infusions: . clindamycin (CLEOCIN) IV Stopped (11/24/16 1025)     LOS: 2 days    Time spent: 25 min    [place], DO [facility] Pager [phone]  If 7PM-7AM, please contact night-coverage [email or web address] Password [email or web address] 11/24/2016, 4:08 PM

## 2016-11-24 NOTE — Progress Notes (Signed)
Orthopedic Tech Progress Note Patient Details:  Brad [name] [birth year] [id number]  Ortho Devices Type of Ortho Device:  (sling and swathe) Ortho Device/Splint Location: lue Ortho Device/Splint Interventions: Application   [name] 11/24/2016, 9:57 AM

## 2016-11-24 NOTE — Progress Notes (Signed)
S:  L hand significantly better, but still sore to touch; pt on clindamycin now  O:Blood pressure (!) 147/88, pulse 86, temperature 98.1 F (36.7 C), temperature source Oral, resp. rate 18, height 6' (1.829 m), weight 70.3 kg (155 lb), SpO2 100 %.  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  Still no significant abscess that I could drain; ? Joint infection of SF mcpj; would continue antibiotics, if not any better tomorrow I will aspirate joint, local wound care for R hand, keep clean and dry.

## 2016-11-25 DIAGNOSIS — M1A0421 Idiopathic chronic gout, left hand, with tophus (tophi): Secondary | ICD-10-CM

## 2016-11-25 LAB — BASIC METABOLIC PANEL
Anion gap: 9 (ref 5–15)
BUN: 10 mg/dL (ref 6–20)
CO2: 22 mmol/L (ref 22–32)
Calcium: 9 mg/dL (ref 8.9–10.3)
Chloride: 101 mmol/L (ref 101–111)
Creatinine, Ser: 1.27 mg/dL — ABNORMAL HIGH (ref 0.61–1.24)
GFR calc Af Amer: >60 mL/min (ref >60)
GFR calc non Af Amer: >60 mL/min (ref >60)
Glucose, Bld: 205 mg/dL — ABNORMAL HIGH (ref 65–99)
Potassium: 4.5 mmol/L (ref 3.5–5.1)
Sodium: 132 mmol/L — ABNORMAL LOW (ref 135–145)

## 2016-11-25 LAB — CBC
HCT: 33.6 % — ABNORMAL LOW (ref 39.0–52.0)
Hemoglobin: 11.5 g/dL — ABNORMAL LOW (ref 13.0–17.0)
MCH: 31.3 pg (ref 26.0–34.0)
MCHC: 34.2 g/dL (ref 30.0–36.0)
MCV: 91.3 fL (ref 78.0–100.0)
Platelets: 532 10*3/uL — ABNORMAL HIGH (ref 150–400)
RBC: 3.68 MIL/uL — ABNORMAL LOW (ref 4.22–5.81)
RDW: 12.8 % (ref 11.5–15.5)
WBC: 8.6 10*3/uL (ref 4.0–10.5)

## 2016-11-25 MED ORDER — LISINOPRIL 20 MG PO TABS: 20.0000 mg | ORAL_TABLET | Freq: Every day | ORAL | 0 refills | Status: DC

## 2016-11-25 MED ORDER — AMLODIPINE BESYLATE 5 MG PO TABS: 5.0000 mg | ORAL_TABLET | Freq: Every day | ORAL | 0 refills | Status: DC

## 2016-11-25 MED ORDER — DOXYCYCLINE HYCLATE 100 MG PO CAPS: 100.0000 mg | ORAL_CAPSULE | Freq: Two times a day (BID) | ORAL | 0 refills | Status: AC

## 2016-11-25 MED ORDER — PREDNISONE 20 MG PO TABS: 40.0000 mg | ORAL_TABLET | Freq: Every day | ORAL | 0 refills | Status: AC

## 2016-11-25 NOTE — Progress Notes (Signed)
[name] to be D/C'd  per MD order. Discussed with the patient and all questions fully answered.  VSS, Skin clean, dry and intact without evidence of skin break down, no evidence of skin tears noted.  IV catheter discontinued intact. Site without signs and symptoms of complications. Dressing and pressure applied.  An After Visit Summary was printed and given to the patient. Patient received prescription.  D/c education completed with patient/family including follow up instructions, medication list, d/c activities limitations if indicated, with other d/c instructions as indicated by MD - patient able to verbalize understanding, all questions fully answered.   Patient instructed to return to ED, call 911, or call MD for any changes in condition.   Patient wanted to walk out, decline WC transport, and D/C home via private auto.

## 2016-11-25 NOTE — Discharge Summary (Signed)
Physician Discharge Summary  Brad [name] RJJ:[id number] DOB: January 31, 1956 DOA: 11/21/2016  PCP: [name], MD  Admit date: 11/21/2016 Discharge date: 11/25/2016  Admitted From:home Disposition:home  Recommendations for Outpatient Follow-up:  1. Follow up with PCP in 1-2 weeks 2. Please obtain BMP/CBC in one week  Home Health:no Equipment/Devices:no Discharge Condition:stable CODE STATUS:full Diet recommendation:heart healthy  Brief/Interim Summary: 61 year old male with history of gout and hypertension presented to ER after a spider bite when he was discharged with oral Keflex. Patient took 4 doses of antibiotics but the left hand is continues to swell associated with worsening pain and difficulty moving. CT scan of hand done in ER showed no abscess. Patient was not improving with outside oral medications therefore admitted for IV antibiotics. Seen by hand surgeon as well.  Left hand cellulitis versus acute gout flare: Patient was treated with both IV clindamycin and prednisone with clinical improvement. Patient reported that he is able to move his hand and pain is significantly better. Discussed with Dr.Coley who recommended that patient can be discharged with current medications and outpatient follow-up. Patient is discharged with oral doxycycline and oral prednisone. I recommended patient to follow-up with PCP.  Acute kidney injury improved. Patient was advised to avoid NSAIDs.  Essential hypertension: on Norvasc and lisinopril. Recommended to monitor blood pressure and follow-up with PCP.  Patient is clinically improved. Able to move his left hand. He verbalized understanding of follow-up instruction. Discharging with medications.  Discharge Diagnoses:  Active Problems:   Cellulitis of hand, left   Gout   Essential hypertension   AKI (acute kidney injury) [facility])    Discharge Instructions  Discharge Instructions    Call MD for:  difficulty breathing, headache or visual  disturbances    Complete by:  As directed    Call MD for:  extreme fatigue    Complete by:  As directed    Call MD for:  hives    Complete by:  As directed    Call MD for:  persistant dizziness or light-headedness    Complete by:  As directed    Call MD for:  persistant nausea and vomiting    Complete by:  As directed    Call MD for:  severe uncontrolled pain    Complete by:  As directed    Call MD for:  temperature >100.4    Complete by:  As directed    Diet - low sodium heart healthy    Complete by:  As directed    Discharge instructions    Complete by:  As directed    Please take probiotics or yogurt while on antibiotics. Please follow up with your PCP and hand surgeon.   Increase activity slowly    Complete by:  As directed      Allergies as of 11/25/2016   No Known Allergies     Medication List    STOP taking these medications   cephALEXin 500 MG capsule Commonly known as:  KEFLEX   ibuprofen 200 MG tablet Commonly known as:  ADVIL,MOTRIN     TAKE these medications   acetaminophen 500 MG tablet Commonly known as:  TYLENOL Take 500 mg by mouth every 6 (six) hours as needed for moderate pain.   amLODipine 5 MG tablet Commonly known as:  NORVASC Take 1 tablet (5 mg total) by mouth daily. Start taking on:  11/26/2016   doxycycline 100 MG capsule Commonly known as:  VIBRAMYCIN Take 1 capsule (100 mg total) by mouth 2 (two)  times daily.   lisinopril 20 MG tablet Commonly known as:  PRINIVIL,ZESTRIL Take 1 tablet (20 mg total) by mouth daily. Start taking on:  11/26/2016   predniSONE 20 MG tablet Commonly known as:  DELTASONE Take 2 tablets (40 mg total) by mouth daily with breakfast. Start taking on:  11/26/2016 What changed:  when to take this   PRESCRIPTION MEDICATION Take 1 tablet by mouth daily. Blood pressure medication. Unknown name, pt has not filled at his pharmacy      Follow-up [place], MD. Schedule an appointment as soon as  possible for a visit in 1 week(s).   Specialty:  Family Medicine Contact information: [address] [place] [address] [phone]        [name], MD. Schedule an appointment as soon as possible for a visit in 2 week(s).   Specialty:  General Surgery Contact information: [address] [place] [address] [phone]          No Known Allergies  Consultations: Hand surgeon  Procedures/Studies: None  Subjective: Seen and examined at bedside. Reported feeling 100% better today and ready to go home. Denied fever, chills, nausea, vomiting, chest pain or shortness of breath. Able to move his left hand.  Discharge Exam: Vitals:   11/25/16 0958 11/25/16 1038  BP: (!) 156/106 (!) 157/89  Pulse:    Resp:    Temp:     Vitals:   11/25/16 0552 11/25/16 0853 11/25/16 0958 11/25/16 1038  BP: (!) 160/88 (!) 170/95 (!) 156/106 (!) 157/89  Pulse: 85     Resp: 18     Temp: 98.2 F (36.8 C)     TempSrc: Oral     SpO2: 100%     Weight:      Height:        General: Pt is alert, awake, not in acute distress Cardiovascular: RRR, S1/S2 +, no rubs, no gallops Respiratory: CTA bilaterally, no wheezing, no rhonchi Abdominal: Soft, NT, ND, bowel sounds + Extremities: no edema, no cyanosis. Left wrist joint: Able to move, minimal swelling. dark skin therefore unable to evaluate erythema.   The results of significant diagnostics from this hospitalization (including imaging, microbiology, ancillary and laboratory) are listed below for reference.     Microbiology: No results found for this or any previous visit (from the past 240 hour(s)).   Labs: BNP (last 3 results) No results for input(s): BNP in the last 8760 hours. Basic Metabolic Panel:  Recent Labs Lab 11/21/16 0806 11/21/16 1345 11/22/16 0350 11/23/16 0408 11/24/16 0809 11/25/16 0356  NA 134*  --  133* 137 136 132*  K 3.4*  --  3.5 4.4 4.2 4.5  CL 103  --  104 106 102 101  CO2  20*  --  21* 22 22 22   GLUCOSE 129*  --  127* 95 76 205*  BUN 8  --  11 10 6 10   CREATININE 1.02 0.93 1.33* 1.27* 1.28* 1.27*  CALCIUM 9.3  --  8.4* 8.5* 9.1 9.0  MG  --   --  1.7  --   --   --    Liver Function Tests:  Recent Labs Lab 11/21/16 0806  AST 14*  ALT 9*  ALKPHOS 49  BILITOT 1.1  PROT 7.4  ALBUMIN 3.6   No results for input(s): LIPASE, AMYLASE in the last 168 hours. No results for input(s): AMMONIA in the last 168 hours. CBC:  Recent  Labs Lab 11/19/16 1158 11/21/16 0806 11/21/16 1345 11/22/16 0350 11/23/16 0408 11/24/16 0809 11/25/16 0356  WBC 10.5 11.9* 10.5 8.3 7.3 7.7 8.6  NEUTROABS 7.6 9.7*  --  6.2  --   --   --   HGB 12.5* 12.2* 10.9* 10.5* 9.7* 10.2* 11.5*  HCT 36.4* 35.6* 31.4* 31.0* 29.3* 30.1* 33.6*  MCV 93.8 92.7 92.4 93.1 93.6 92.9 91.3  PLT 443* 438* 403* 384 414* 489* 532*   Cardiac Enzymes: No results for input(s): CKTOTAL, CKMB, CKMBINDEX, TROPONINI in the last 168 hours. BNP: Invalid input(s): POCBNP CBG: No results for input(s): GLUCAP in the last 168 hours. D-Dimer No results for input(s): DDIMER in the last 72 hours. Hgb A1c No results for input(s): HGBA1C in the last 72 hours. Lipid Profile No results for input(s): CHOL, HDL, LDLCALC, TRIG, CHOLHDL, LDLDIRECT in the last 72 hours. Thyroid function studies No results for input(s): TSH, T4TOTAL, T3FREE, THYROIDAB in the last 72 hours.  Invalid input(s): FREET3 Anemia work up No results for input(s): VITAMINB12, FOLATE, FERRITIN, TIBC, IRON, RETICCTPCT in the last 72 hours. Urinalysis No results found for: COLORURINE, APPEARANCEUR, LABSPEC, [place], GLUCOSEU, HGBUR, BILIRUBINUR, KETONESUR, PROTEINUR, UROBILINOGEN, NITRITE, LEUKOCYTESUR Sepsis Labs Invalid input(s): PROCALCITONIN,  WBC,  LACTICIDVEN Microbiology No results found for this or any previous visit (from the past 240 hour(s)).   Time coordinating discharge: 33 minutes  SIGNED:   Rosita Fire,  MD  [facility] 11/25/2016, 12:59 PM  If 7PM-7AM, please contact night-coverage [email or web address] Password [email or web address]

## 2016-11-26 ENCOUNTER — Encounter (HOSPITAL_COMMUNITY): Payer: Self-pay

## 2016-11-26 ENCOUNTER — Emergency Department (HOSPITAL_COMMUNITY)
Admission: EM | Admit: 2016-11-26 | Discharge: 2016-11-26 | Disposition: A | Discharge status: Home / Self Care | Payer: No Typology Code available for payment source | Attending: Emergency Medicine | Admitting: Emergency Medicine

## 2016-11-26 DIAGNOSIS — I10 Essential (primary) hypertension: Secondary | ICD-10-CM | POA: Insufficient documentation

## 2016-11-26 DIAGNOSIS — F17210 Nicotine dependence, cigarettes, uncomplicated: Secondary | ICD-10-CM | POA: Insufficient documentation

## 2016-11-26 DIAGNOSIS — Z79899 Other long term (current) drug therapy: Secondary | ICD-10-CM | POA: Diagnosis not present

## 2016-11-26 DIAGNOSIS — M79642 Pain in left hand: Secondary | ICD-10-CM | POA: Diagnosis not present

## 2016-11-26 MED ORDER — HYDROCODONE-ACETAMINOPHEN 5-325 MG PO TABS: 2.0000 | ORAL_TABLET | Freq: Four times a day (QID) | ORAL | 0 refills | Status: DC | PRN

## 2016-11-26 NOTE — ED Triage Notes (Signed)
Patient states he was bitten by a spider 9 days ago. Patient has swelling and pain to the left hand. Patient was recently hospitalized for the same and is concerned because he still has pain and swelling.

## 2016-11-26 NOTE — Discharge Instructions (Signed)
Take pain medication as needed. Continue doxycycline, prednisone and other home medications as previously prescribed. Follow-up with Specialist list below for further evaluation. Return to ED for worsening swelling, additional injury, fevers, temperature change of hand, inability to feel or move hand.

## 2016-11-26 NOTE — ED Notes (Signed)
Bed: WTR6 Expected date:  Expected time:  Means of arrival:  Comments: 

## 2016-11-26 NOTE — ED Notes (Signed)
Bed: WTR7 Expected date:  Expected time:  Means of arrival:  Comments: 

## 2016-11-26 NOTE — ED Provider Notes (Signed)
[facility] DEPT Provider Note   CSN: [id number] Arrival date & time: 11/26/16  1030  By signing my name below, I, Reola Mosher, attest that this documentation has been prepared under the direction and in the presence of Humberto Addo, PA-C.  Electronically Signed: Reola Mosher, ED Scribe. 11/26/16. 11:47 AM.  History   Chief Complaint Chief Complaint  Patient presents with  . Hand Pain   The history is provided by the patient and medical records. No language interpreter was used.    HPI Comments: Brad [name] is a 61 y.o. male  who presents to the Emergency Department complaining of persistent, worsening area of pain to the left dorsal hand beginning approximately two weeks ago After a spider bite.  Per pt, two weeks ago he visualized a black spider bite to the left dorsal hand. He was seen at [facility] following this and was placed on an outpatient trial of Keflex. He begin taking this, but his symptoms did not improve and was seen again in the ED on 11/22/06 (~5 days ago) and subsequently admitted. He underwent CT of the hand at that time which was overall unremarkable and did not show signs of an abscess. He was given IV antibiotics and pain medications during his stay which acutely assisted with his symptoms; he was d/c'd yesterday w/ courses of Doxycycline and Prednisone which he states he has taken one dose of Each. His pain has worsened since being discharged despite taking these medications. His pain is worse with movement of the digits although he is able to move his digits. No h/o similar reactions to insect/arachnid bites. No recent new arachnid or insect bites. He denies fever, nausea, vomiting, anorexia, numbness, weakness, or any other associated symptoms.   Past Medical History:  Diagnosis Date  . Gout   . Hypertension    Patient Active Problem List   Diagnosis Date Noted  . AKI (acute kidney injury) ([place]) 11/23/2016  . Cellulitis of hand, left 11/21/2016  .  Gout 11/21/2016  . Essential hypertension 11/21/2016   History reviewed. No pertinent surgical history.  Home Medications    Prior to Admission medications   Medication Sig Start Date End Date Taking? Authorizing Provider  acetaminophen (TYLENOL) 500 MG tablet Take 500 mg by mouth every 6 (six) hours as needed for moderate pain.    [provider]  amLODipine (NORVASC) 5 MG tablet Take 1 tablet (5 mg total) by mouth daily. 11/26/16   Rosita Fire, MD  doxycycline (VIBRAMYCIN) 100 MG capsule Take 1 capsule (100 mg total) by mouth 2 (two) times daily. 11/25/16 11/30/16  Rosita Fire, MD  HYDROcodone-acetaminophen (NORCO/VICODIN) 5-325 MG tablet Take 2 tablets by mouth every 6 (six) hours as needed. 11/26/16   Wendy Mikles, PA-C  lisinopril (PRINIVIL,ZESTRIL) 20 MG tablet Take 1 tablet (20 mg total) by mouth daily. 11/26/16   Rosita Fire, MD  predniSONE (DELTASONE) 20 MG tablet Take 2 tablets (40 mg total) by mouth daily with breakfast. 11/26/16 11/30/16  Rosita Fire, MD  PRESCRIPTION MEDICATION Take 1 tablet by mouth daily. Blood pressure medication. Unknown name, pt has not filled at his pharmacy    [provider]   Family History Family History  Problem Relation Age of Onset  . Diabetes Mother    Social History Social History  Substance Use Topics  . Smoking status: Current Every Day Smoker    Packs/day: 0.15    Types: Cigarettes  . Smokeless tobacco: Never Used  .  Alcohol use Yes     Comment: 16 ounce beer a day   Allergies   Patient has no known allergies.  Review of Systems Review of Systems  Constitutional: Negative for appetite change and fever.  HENT: Negative for facial swelling.   Respiratory: Negative for shortness of breath.   Cardiovascular: Negative for chest pain.  Musculoskeletal: Positive for joint swelling and myalgias.  Skin: Positive for color change.  Neurological: Negative for weakness, light-headedness and  numbness.   Physical Exam Updated Vital Signs BP (!) 148/114 (BP Location: Right Arm) Comment: Pt did not take BP medication this morning  Pulse (!) 103   Temp 98 F (36.7 C) (Oral)   Resp 18   Ht 6' (1.829 m)   Wt 70.3 kg (155 lb)   SpO2 100%   BMI 21.02 kg/m   Physical Exam  Constitutional: He appears well-developed and well-nourished. No distress.  HENT:  Head: Normocephalic and atraumatic.  Eyes: Conjunctivae and EOM are normal. No scleral icterus.  Neck: Normal range of motion.  Cardiovascular: Normal rate, regular rhythm and intact distal pulses.   Pulmonary/Chest: Effort normal and breath sounds normal. No respiratory distress.  Musculoskeletal: Normal range of motion. He exhibits edema and tenderness.  Neurological: He is alert. No sensory deficit.  No sensory deficit noted of the left hand. No objective signs of numbness present. Good distal pulses.  Skin: Skin is warm and dry. No rash noted. He is not diaphoretic. No erythema.  TTP of the fourth and fifth MCP joints. No abscess or temperature changes noted. No visible blisters or bites noted. Pain with ROM of the fourth and fifth digits but able to move.  Psychiatric: He has a normal mood and affect.  Nursing note and vitals reviewed.     ED Treatments / Results  DIAGNOSTIC STUDIES: Oxygen Saturation is 100% on RA, normal by my interpretation.   COORDINATION OF CARE: 11:47 AM-Discussed next steps with pt. Pt verbalized understanding and is agreeable with the plan.   Labs (all labs ordered are listed, but only abnormal results are displayed) Labs Reviewed - No data to display  EKG  EKG Interpretation None      Radiology No results found.  Procedures Procedures   Medications Ordered in ED Medications - No data to display  Initial Impression / Assessment and Plan / ED Course  I have reviewed the triage vital signs and the nursing notes.  Pertinent labs & imaging results that were available  during my care of the patient were reviewed by me and considered in my medical decision making (see chart for details).     Patient presents to the emergency room after being discharged from inpatient hospitalization yesterday for IV antibiotics for spider bite that occurred approximately 2 weeks ago. Patient states that he is here today because he is still experiencing pain and swelling of the area. He was given doxycycline and prednisone which he states he has taken 1 dose each. Based on image inserted from initial ED visit, it appears that the swelling has improved slightly. There is still no temperature change or palpable abscess noted, and no color change. Imaging done previously was negative for abscess. Patient is afebrile with no history of fever. He is not in respiratory distress and does not appear systemically ill. There are no objective signs of numbness present in although he has pain with movement of his fingers he is able to move. No focal deficits found on neurovascular exam of hand. He  states that he would like something to help him with the pain because he is unable to sleep at night. Patient has a history of gout in the same hand which she states is also causing him pain. I advised him to take anti-inflammatories but will give short course of narcotic pain medication to help. I advised him to follow up with Dr. Lenon Curt who consulted while he was in the hospital. Strict return precautions given.  Final Clinical Impressions(s) / ED Diagnoses   Final diagnoses:  Left hand pain   New Prescriptions Discharge Medication List as of 11/26/2016 12:18 PM    START taking these medications   Details  HYDROcodone-acetaminophen (NORCO/VICODIN) 5-325 MG tablet Take 2 tablets by mouth every 6 (six) hours as needed., Starting Fri 11/26/2016, Print       I personally performed the services described in this documentation, which was scribed in my presence. The recorded information has been reviewed and  is accurate.     Delia Heady, PA-C 11/26/16 1348    Little, Wenda Overland, MD 11/30/16 2025

## 2020-05-26 ENCOUNTER — Encounter (HOSPITAL_COMMUNITY): Payer: Self-pay | Admitting: Family Medicine

## 2020-05-26 ENCOUNTER — Other Ambulatory Visit: Payer: Self-pay

## 2020-05-26 ENCOUNTER — Ambulatory Visit (HOSPITAL_COMMUNITY)
Admission: RE | Admit: 2020-05-26 | Discharge: 2020-05-26 | Disposition: A | Discharge status: Still In Hospital | Payer: Self-pay | Source: Ambulatory Visit

## 2020-05-26 ENCOUNTER — Ambulatory Visit (HOSPITAL_COMMUNITY)
Admission: EM | Admit: 2020-05-26 | Discharge: 2020-05-26 | Disposition: A | Discharge status: Home / Self Care | Payer: No Typology Code available for payment source | Attending: Family Medicine | Admitting: Family Medicine

## 2020-05-26 DIAGNOSIS — M1A0420 Idiopathic chronic gout, left hand, without tophus (tophi): Secondary | ICD-10-CM

## 2020-05-26 DIAGNOSIS — I10 Essential (primary) hypertension: Secondary | ICD-10-CM

## 2020-05-26 MED ORDER — COLCHICINE 0.6 MG PO TABS: 0.6000 mg | ORAL_TABLET | Freq: Every day | ORAL | 1 refills | Status: DC

## 2020-05-26 MED ORDER — DOXYCYCLINE HYCLATE 100 MG PO TABS: 100.0000 mg | ORAL_TABLET | Freq: Two times a day (BID) | ORAL | 0 refills | Status: DC

## 2020-05-26 MED ORDER — AMLODIPINE BESYLATE 5 MG PO TABS: 5.0000 mg | ORAL_TABLET | Freq: Every day | ORAL | 3 refills | Status: DC

## 2020-05-26 MED ORDER — LISINOPRIL 20 MG PO TABS: 20.0000 mg | ORAL_TABLET | Freq: Every day | ORAL | 3 refills | Status: DC

## 2020-05-26 NOTE — ED Triage Notes (Signed)
Pt reports he had a bump in the left finger ring x 1 week. States yesterday he woke up and the bump was open with yellow drainage. Denies pain, fever, chills.

## 2020-05-26 NOTE — ED Provider Notes (Addendum)
[facility]    CSN: [id number] Arrival date & time: 05/26/20  1850      History   Chief Complaint Chief Complaint  Patient presents with  . Appointment    1900  . Abscess    HPI Aleksandr Pellow is a 64 y.o. male.   This is the initial visit for this 64 year old man who reports having a left hand boil.  He was seen in the emergency room 5 months ago. Patient works in a Human resources officer.  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  He has been having a couple weeks of pain in that left hand, at the base of the fourth finger, volar aspect.  It opened up 2 days ago and started to drain.  Patient also ran out of blood pressure medicine 3 weeks ago.  He is not complaining of headaches or chest pain.  Note from emergency room: Antoinette Haskett is a 64 y.o. male  who presents to the Emergency Department complaining of persistent, worsening area of pain to the left dorsal hand beginning approximately two weeks ago After a spider bite.  Per pt, two weeks ago he visualized a black spider bite to the left dorsal hand. He was seen at [facility] following this and was placed on an outpatient trial of Keflex. He begin taking this, but his symptoms did not improve and was seen again in the ED on 11/22/06 (~5 days ago) and subsequently admitted. He underwent CT of the hand at that time which was overall unremarkable and did not show signs of an abscess. He was given IV antibiotics and pain medications during his stay which acutely assisted with his symptoms; he was d/c'd yesterday w/ courses of Doxycycline and Prednisone which he states he has taken one dose of Each. His pain has worsened since being discharged despite taking these medications. His pain is worse with movement of the digits although he is able to move his digits. No h/o similar reactions to insect/arachnid bites. No recent new arachnid or insect bites. He denies fever, nausea, vomiting, anorexia, numbness, weakness,  or any other associated symptoms.       Past Medical History:  Diagnosis Date  . Gout   . Hypertension     Patient Active Problem List   Diagnosis Date Noted  . AKI (acute kidney injury) ([place]) 11/23/2016  . Cellulitis of hand, left 11/21/2016  . Gout 11/21/2016  . Essential hypertension 11/21/2016    History reviewed. No pertinent surgical history.     Home Medications    Prior to Admission medications   Medication Sig Start Date End Date Taking? Authorizing Provider  acetaminophen (TYLENOL) 500 MG tablet Take 500 mg by mouth every 6 (six) hours as needed for moderate pain.    [provider]  amLODipine (NORVASC) 5 MG tablet Take 1 tablet (5 mg total) by mouth daily. 05/26/20   Robyn Haber, MD  colchicine 0.6 MG tablet Take 1 tablet (0.6 mg total) by mouth daily. 05/26/20   Robyn Haber, MD  doxycycline (VIBRA-TABS) 100 MG tablet Take 1 tablet (100 mg total) by mouth 2 (two) times daily. 05/26/20   Robyn Haber, MD  HYDROcodone-acetaminophen (NORCO/VICODIN) 5-325 MG tablet Take 2 tablets by mouth every 6 (six) hours as needed. 11/26/16   Khatri, Hina, PA-C  lisinopril (ZESTRIL) 20 MG tablet Take 1 tablet (20 mg total) by mouth daily. 05/26/20   Robyn Haber, MD  PRESCRIPTION MEDICATION Take 1 tablet by mouth  daily. Blood pressure medication. Unknown name, pt has not filled at his pharmacy    [provider]    Family History Family History  Problem Relation Age of Onset  . Diabetes Mother     Social History Social History   Tobacco Use  . Smoking status: Current Every Day Smoker    Packs/day: 0.15    Types: Cigarettes  . Smokeless tobacco: Never Used  Vaping Use  . Vaping Use: Never used  Substance Use Topics  . Alcohol use: Yes    Comment: 16 ounce beer a day  . Drug use: No     Allergies   Patient has no known allergies.   Review of Systems Review of Systems  Constitutional: Negative for fever.  Musculoskeletal:  Positive for joint swelling.     Physical Exam Triage Vital Signs ED Triage Vitals  Enc Vitals Group     BP      Pulse      Resp      Temp      Temp src      SpO2      Weight      Height      Head Circumference      Peak Flow      Pain Score      Pain Loc      Pain Edu?      Excl. in [place]?    No data found.  Updated Vital Signs BP (!) 213/118 (BP Location: Right Arm)   Pulse 71   Temp 98.3 F (36.8 C) (Oral)   Resp 18   SpO2 96%    Physical Exam Vitals and nursing note reviewed.  Constitutional:      Appearance: Normal appearance. He is normal weight.  Pulmonary:     Effort: Pulmonary effort is normal.  Musculoskeletal:        General: Swelling, tenderness and deformity present. No signs of injury.     Cervical back: Normal range of motion and neck supple.  Skin:    General: Skin is warm.     Findings: Erythema and lesion present.  Neurological:     Mental Status: He is alert.        UC Treatments / Results  Labs (all labs ordered are listed, but only abnormal results are displayed) Labs Reviewed - No data to display  EKG   Radiology No results found.  Procedures Procedures (including critical care time)  Medications Ordered in UC Medications - No data to display  Initial Impression / Assessment and Plan / UC Course  I have reviewed the triage vital signs and the nursing notes.  Pertinent labs & imaging results that were available during my care of the patient were reviewed by me and considered in my medical decision making (see chart for details).    Final Clinical Impressions(s) / UC Diagnoses   Final diagnoses:  Chronic gout of left hand, unspecified cause  Essential hypertension     Discharge Instructions     Soak the hand in warm soapy water at least every 2 hours and keep chemicals out of the wound.  Please come back in 2 days so we can look at this again.    ED Prescriptions    Medication Sig Dispense Auth. Provider    colchicine 0.6 MG tablet Take 1 tablet (0.6 mg total) by mouth daily. 20 tablet Robyn Haber, MD   doxycycline (VIBRA-TABS) 100 MG tablet Take 1 tablet (100 mg total) by  mouth 2 (two) times daily. 20 tablet Robyn Haber, MD   amLODipine (NORVASC) 5 MG tablet Take 1 tablet (5 mg total) by mouth daily. 90 tablet Robyn Haber, MD   lisinopril (ZESTRIL) 20 MG tablet Take 1 tablet (20 mg total) by mouth daily. 90 tablet Robyn Haber, MD     I have reviewed the PDMP during this encounter.   Robyn Haber, MD 05/26/20 1918    Robyn Haber, MD 05/26/20 [place]

## 2020-05-26 NOTE — ED Notes (Signed)
Blood pressure  Reported to Dr. Joseph Art.

## 2020-05-26 NOTE — Discharge Instructions (Signed)
Soak the hand in warm soapy water at least every 2 hours and keep chemicals out of the wound.  Please come back in 2 days so we can look at this again.

## 2020-05-29 ENCOUNTER — Ambulatory Visit (HOSPITAL_COMMUNITY)
Admission: EM | Admit: 2020-05-29 | Discharge: 2020-05-29 | Disposition: A | Discharge status: Home / Self Care | Payer: No Typology Code available for payment source

## 2020-05-29 ENCOUNTER — Other Ambulatory Visit: Payer: Self-pay

## 2020-05-29 ENCOUNTER — Encounter (HOSPITAL_COMMUNITY): Payer: Self-pay

## 2020-05-29 DIAGNOSIS — M1A0420 Idiopathic chronic gout, left hand, without tophus (tophi): Secondary | ICD-10-CM

## 2020-05-29 DIAGNOSIS — I10 Essential (primary) hypertension: Secondary | ICD-10-CM

## 2020-05-29 NOTE — ED Provider Notes (Addendum)
[facility]    CSN: [id number] Arrival date & time: 05/29/20  1416       History   Chief Complaint Chief Complaint  Patient presents with  . Abscess    Left hand    HPI Brad [name] is a 64 y.o. male presenting for wound recheck. He was last seen here 2 days ago. Has been taking the doxycycline for 2 days and is also applying topical antibiotic. States the wound appears smaller and less red to him. Denies fevers/chills; he is feeling well otherwise. Has not started the gout medication due to this being backordered but he will start this soon.  States he filled the prescription for blood pressure medication and has been taking this for 1 day. Denies chest pain, headache, urinary symptoms.    Per note from 2 days ago (05/26/2020)--  64 year old man who reports having a left hand boil.  He was seen in the emergency room 5 months ago. Patient works in a Human resources officer.  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  He has been having a couple weeks of pain in that left hand, at the base of the fourth finger, volar aspect.  It opened up 2 days ago and started to drain.  Patient also ran out of blood pressure medicine 3 weeks ago.  He is not complaining of headaches or chest pain.  Note from emergency room (2018?): Brad [name] is a 64 y.o. male  who presents to the Emergency Department complaining of persistent, worsening area of pain to the left dorsal hand beginning approximately two weeks ago After a spider bite.  Per pt, two weeks ago he visualized a black spider bite to the left dorsal hand. He was [facility] following this and was placed on an outpatient trial of Keflex. He begin taking this, but his symptoms did not improve and was seen again in the ED on 11/22/06 (~5 days ago) and subsequently admitted. He underwent CT of the hand at that time which was overall unremarkable and did not show signs of an abscess. He was given IV antibiotics and  pain medications during his stay which acutely assisted with his symptoms; he was d/c'd yesterday w/ courses of Doxycycline and Prednisone which he states he has taken one dose of Each. His pain has worsened since being discharged despite taking these medications. His pain is worse with movement of the digits although he is able to move his digits. No h/o similar reactions to insect/arachnid bites. No recent new arachnid or insect bites. He denies fever, nausea, vomiting, anorexia, numbness, weakness, or any other associated symptoms  HPI  Past Medical History:  Diagnosis Date  . Gout   . Hypertension     Patient Active Problem List   Diagnosis Date Noted  . AKI (acute kidney injury) ([place]) 11/23/2016  . Cellulitis of hand, left 11/21/2016  . Gout 11/21/2016  . Essential hypertension 11/21/2016    History reviewed. No pertinent surgical history.     Home Medications    Prior to Admission medications   Medication Sig Start Date End Date Taking? Authorizing Provider  colchicine 0.6 MG tablet Take 1 tablet (0.6 mg total) by mouth daily. 05/26/20  Yes Robyn Haber, MD  doxycycline (VIBRA-TABS) 100 MG tablet Take 1 tablet (100 mg total) by mouth 2 (two) times daily. 05/26/20  Yes Robyn Haber, MD  acetaminophen (TYLENOL) 500 MG tablet Take 500 mg by mouth every 6 (six) hours as  needed for moderate pain.    [provider]  amLODipine (NORVASC) 5 MG tablet Take 1 tablet (5 mg total) by mouth daily. 05/26/20   Robyn Haber, MD  HYDROcodone-acetaminophen (NORCO/VICODIN) 5-325 MG tablet Take 2 tablets by mouth every 6 (six) hours as needed. 11/26/16   Khatri, Hina, PA-C  lisinopril (ZESTRIL) 20 MG tablet Take 1 tablet (20 mg total) by mouth daily. 05/26/20   Robyn Haber, MD  PRESCRIPTION MEDICATION Take 1 tablet by mouth daily. Blood pressure medication. Unknown name, pt has not filled at his pharmacy    [provider]    Family History Family History   Problem Relation Age of Onset  . Diabetes Mother     Social History Social History   Tobacco Use  . Smoking status: Current Every Day Smoker    Packs/day: 0.15    Types: Cigarettes  . Smokeless tobacco: Never Used  Vaping Use  . Vaping Use: Never used  Substance Use Topics  . Alcohol use: Yes    Comment: 16 ounce beer a day  . Drug use: No     Allergies   Patient has no known allergies.   Review of Systems Review of Systems  Constitutional: Negative for chills, diaphoresis and fever.  HENT:       Negative for headache  Respiratory: Negative for shortness of breath.   Cardiovascular: Negative for chest pain.  All other systems reviewed and are negative.    Physical Exam Triage Vital Signs ED Triage Vitals  Enc Vitals Group     BP 05/29/20 1537 (!) 174/111     Pulse Rate 05/29/20 1537 69     Resp 05/29/20 1537 18     Temp 05/29/20 1537 98.4 F (36.9 C)     Temp Source 05/29/20 1537 Oral     SpO2 05/29/20 1537 100 %     Weight --      Height --      Head Circumference --      Peak Flow --      Pain Score 05/29/20 1535 3     Pain Loc --      Pain Edu? --      Excl. in [place]? --    No data found.  Updated Vital Signs BP (!) 174/111 (BP Location: Left Arm)   Pulse 69   Temp 98.4 F (36.9 C) (Oral)   Resp 18   SpO2 100%   Visual Acuity Right Eye Distance:   Left Eye Distance:   Bilateral Distance:    Right Eye Near:   Left Eye Near:    Bilateral Near:     Physical Exam Vitals reviewed.  Constitutional:      Appearance: Normal appearance.  HENT:     Head: Normocephalic and atraumatic.  Cardiovascular:     Rate and Rhythm: Normal rate and regular rhythm.     Heart sounds: Normal heart sounds.  Pulmonary:     Effort: Pulmonary effort is normal.     Breath sounds: Normal breath sounds.  Skin:    Comments: Left ring finger: 1cm x1.5cm area of erythema, swelling, tenderness and deformity present. No discharge or active bleeding.   Neurological:     Mental Status: He is alert.      UC Treatments / Results  Labs (all labs ordered are listed, but only abnormal results are displayed) Labs Reviewed - No data to display  EKG   Radiology No results found.  Procedures Procedures (including critical  care time)  Medications Ordered in UC Medications - No data to display  Initial Impression / Assessment and Plan / UC Course  I have reviewed the triage vital signs and the nursing notes.  Pertinent labs & imaging results that were available during my care of the patient were reviewed by me and considered in my medical decision making (see chart for details).     Continue doxycycline as prescribed. Also start gout medication prescribed 2 days ago.  Continue lisinopril and amlodipine as directed.  Please return in 3 days for wound and blood pressure recheck. Return precautions- fevers, worsening of wound, chest pain, shortness of breath, etc.  Final Clinical Impressions(s) / UC Diagnoses   Final diagnoses:  Chronic gout of left hand, unspecified cause  Essential hypertension     Discharge Instructions     Continue to take your antibiotic as prescribed, and keep wound clean. Continue to take your hypertension medication as prescribed. Start taking your gout medication once this prescription is ready. Follow-up with this clinic in 3 days for wound and blood pressure recheck.     ED Prescriptions    None     PDMP not reviewed this encounter.   Hazel Sams, PA-C 05/29/20 1714    Hazel Sams, PA-C 05/29/20 1715

## 2020-05-29 NOTE — Discharge Instructions (Addendum)
Continue to take your antibiotic as prescribed, and keep wound clean. Continue to take your hypertension medication as prescribed. Start taking your gout medication once this prescription is ready. Follow-up with this clinic in 3 days for wound and blood pressure recheck.

## 2020-05-29 NOTE — ED Triage Notes (Signed)
Pt presents with an abscess on left hand, fourth finger X 5 days. Pt denies fever and chills.

## 2020-06-08 ENCOUNTER — Other Ambulatory Visit: Payer: Self-pay

## 2020-06-08 ENCOUNTER — Encounter (HOSPITAL_COMMUNITY): Payer: Self-pay | Admitting: *Deleted

## 2020-06-08 ENCOUNTER — Ambulatory Visit (HOSPITAL_COMMUNITY)
Admission: EM | Admit: 2020-06-08 | Discharge: 2020-06-08 | Disposition: A | Discharge status: Home / Self Care | Payer: No Typology Code available for payment source

## 2020-06-08 DIAGNOSIS — J069 Acute upper respiratory infection, unspecified: Secondary | ICD-10-CM | POA: Diagnosis not present

## 2020-06-08 NOTE — ED Triage Notes (Addendum)
Pt reports being treated for gout -- states has been taking med as prescribed with complete improvement; pt concerned he is having side effects of chills, body aches, congestion, poor appetite and loss of taste from med over past 6 days.  Informed pt that he has Covid sxs.

## 2020-06-08 NOTE — Discharge Instructions (Addendum)
Come back for covid test tom0rrow 06/09/2020.

## 2020-06-08 NOTE — ED Provider Notes (Addendum)
[place]    CSN: [id number] Arrival date & time: 06/08/20  1058      History   Chief Complaint Chief Complaint  Patient presents with  . Chills  . Loss of Taste    HPI Brad [name] is a 64 y.o. male presenting with chills and loss of taste. Also endorses decreased appetite but no n/v/d. Last presented to our clinic on 05/29/2020 for gout and was treated with colchicine and doxycycline at that time. Pt attributes his chills and loss of taste to gout medication. States he's feeling a lot better today. Denies , n/v/d, shortness of breath, chest pain, cough, congestion, facial pain, teeth pain, headaches, sore throat, loss of smell, swollen lymph nodes, ear pain. Denies chest pain, shortnss of breath. He is not vaccinated for covid-19.   HPI  Past Medical History:  Diagnosis Date  . Gout   . Hypertension     Patient Active Problem List   Diagnosis Date Noted  . AKI (acute kidney injury) ([place]) 11/23/2016  . Cellulitis of hand, left 11/21/2016  . Gout 11/21/2016  . Essential hypertension 11/21/2016    History reviewed. No pertinent surgical history.     Home Medications    Prior to Admission medications   Medication Sig Start Date End Date Taking? Authorizing Provider  amLODipine (NORVASC) 5 MG tablet Take 1 tablet (5 mg total) by mouth daily. 05/26/20  Yes Robyn Haber, MD  lisinopril (ZESTRIL) 20 MG tablet Take 1 tablet (20 mg total) by mouth daily. 05/26/20  Yes Robyn Haber, MD  acetaminophen (TYLENOL) 500 MG tablet Take 500 mg by mouth every 6 (six) hours as needed for moderate pain.    [provider]  colchicine 0.6 MG tablet Take 1 tablet (0.6 mg total) by mouth daily. 05/26/20   Robyn Haber, MD  doxycycline (VIBRA-TABS) 100 MG tablet Take 1 tablet (100 mg total) by mouth 2 (two) times daily. 05/26/20   Robyn Haber, MD  HYDROcodone-acetaminophen (NORCO/VICODIN) 5-325 MG tablet Take 2 tablets by mouth every 6 (six) hours as  needed. 11/26/16   Khatri, Hina, PA-C  PRESCRIPTION MEDICATION Take 1 tablet by mouth daily. Blood pressure medication. Unknown name, pt has not filled at his pharmacy    [provider]    Family History Family History  Problem Relation Age of Onset  . Diabetes Mother     Social History Social History   Tobacco Use  . Smoking status: Current Every Day Smoker    Packs/day: 0.15    Types: Cigarettes  . Smokeless tobacco: Never Used  Vaping Use  . Vaping Use: Never used  Substance Use Topics  . Alcohol use: Not Currently    Comment: none x 2 wks  . Drug use: No     Allergies   Patient has no known allergies.   Review of Systems Review of Systems  Constitutional: Positive for appetite change and chills.  HENT:       Loss of taste  All other systems reviewed and are negative.    Physical Exam Triage Vital Signs ED Triage Vitals  Enc Vitals Group     BP 06/08/20 1232 111/76     Pulse Rate 06/08/20 1232 88     Resp 06/08/20 1232 20     Temp 06/08/20 1232 98.2 F (36.8 C)     Temp Source 06/08/20 1232 Temporal     SpO2 06/08/20 1232 99 %     Weight --  Height --      Head Circumference --      Peak Flow --      Pain Score 06/08/20 1233 9     Pain Loc --      Pain Edu? --      Excl. in [place]? --    No data found.  Updated Vital Signs BP 111/76   Pulse 88   Temp 98.2 F (36.8 C) (Temporal)   Resp 20   SpO2 99%   Visual Acuity Right Eye Distance:   Left Eye Distance:   Bilateral Distance:    Right Eye Near:   Left Eye Near:    Bilateral Near:     Physical Exam Vitals reviewed.  Constitutional:      General: He is not in acute distress.    Appearance: Normal appearance. He is not ill-appearing.  HENT:     Head: Normocephalic and atraumatic.     Right Ear: Hearing, tympanic membrane, ear canal and external ear normal. No swelling or tenderness. There is no impacted cerumen. No mastoid tenderness. Tympanic membrane is not perforated,  erythematous, retracted or bulging.     Left Ear: Hearing, tympanic membrane, ear canal and external ear normal. No swelling or tenderness. There is no impacted cerumen. No mastoid tenderness. Tympanic membrane is not perforated, erythematous, retracted or bulging.     Nose:     Right Sinus: No maxillary sinus tenderness or frontal sinus tenderness.     Left Sinus: No maxillary sinus tenderness or frontal sinus tenderness.     Mouth/Throat:     Mouth: Mucous membranes are moist.     Pharynx: Uvula midline. No oropharyngeal exudate or posterior oropharyngeal erythema.     Tonsils: No tonsillar exudate.  Cardiovascular:     Rate and Rhythm: Normal rate and regular rhythm.     Heart sounds: Normal heart sounds.  Pulmonary:     Breath sounds: Normal breath sounds and air entry.  Lymphadenopathy:     Cervical: No cervical adenopathy.  Skin:    Comments: Right ring finger: healing well, small area of erythema present but otherwise appears well healed.   Neurological:     General: No focal deficit present.     Mental Status: He is alert and oriented to person, place, and time.  Psychiatric:        Attention and Perception: Attention and perception normal.        Mood and Affect: Mood and affect normal.        Behavior: Behavior is cooperative.      UC Treatments / Results  Labs (all labs ordered are listed, but only abnormal results are displayed) Labs Reviewed - No data to display  EKG   Radiology No results found.  Procedures Procedures (including critical care time)  Medications Ordered in UC Medications - No data to display  Initial Impression / Assessment and Plan / UC Course  I have reviewed the triage vital signs and the nursing notes.  Pertinent labs & imaging results that were available during my care of the patient were reviewed by me and considered in my medical decision making (see chart for details).     Symptoms are consistent with covid-19 infection.  Symptoms have improved on their own and pt declines prescription management. He is not vaccinated for covid19. Declines covid19 test today but states he wishes to return tomorrow for this. Isolation precautions until negative test. Return precautions- new/worsening symptoms including: fevers/chills, shortness of breath, chest  pain, abd pain, etc. Patient verbalizes understanding and agreement. R finger is healing well, pt finished abx.   Final Clinical Impressions(s) / UC Diagnoses   Final diagnoses:  Viral upper respiratory tract infection     Discharge Instructions     Come back for covid test tom0rrow 06/09/2020.    ED Prescriptions    None     PDMP not reviewed this encounter.   Hazel Sams, PA-C 06/08/20 1432    Hazel Sams, PA-C 06/08/20 1432

## 2020-06-09 ENCOUNTER — Ambulatory Visit (HOSPITAL_COMMUNITY)
Admission: EM | Admit: 2020-06-09 | Discharge: 2020-06-09 | Disposition: A | Discharge status: Home / Self Care | Payer: No Typology Code available for payment source | Attending: Family Medicine | Admitting: Family Medicine

## 2020-06-09 ENCOUNTER — Other Ambulatory Visit: Payer: Self-pay

## 2020-06-09 ENCOUNTER — Encounter (HOSPITAL_COMMUNITY): Payer: Self-pay | Admitting: Emergency Medicine

## 2020-06-09 DIAGNOSIS — U071 COVID-19: Secondary | ICD-10-CM | POA: Insufficient documentation

## 2020-06-09 DIAGNOSIS — J069 Acute upper respiratory infection, unspecified: Secondary | ICD-10-CM | POA: Diagnosis present

## 2020-06-09 LAB — SARS CORONAVIRUS 2 (TAT 6-24 HRS): SARS Coronavirus 2: POSITIVE — AB

## 2020-06-09 NOTE — ED Triage Notes (Signed)
Patient c/o chills and loss of taste x 4 days.   Patient denies fever at home.   Patient hasn't taken any medications at home for symptoms.   Patient was seen in clinic yesterday and stated "I was told to come back for a COVID-19 test".

## 2020-06-09 NOTE — Discharge Instructions (Addendum)
You have been tested for COVID-19 today. °If your test returns positive, you will receive a phone call from [facility] regarding your results. °Negative test results are not called. °Both positive and negative results area always visible on MyChart. °If you do not have a MyChart account, sign up instructions are provided in your discharge papers. °Please do not hesitate to contact us should you have questions or concerns. ° °

## 2020-06-10 ENCOUNTER — Telehealth: Payer: Self-pay | Admitting: Physician Assistant

## 2020-06-10 NOTE — Telephone Encounter (Signed)
Called to discuss with patient about Covid symptoms and the use of sotrovimab, bamlanivimab/etesevimab or casirivimab/imdevimab, a monoclonal antibody infusion for those with mild to moderate Covid symptoms and at a high risk of hospitalization.  Pt is qualified for this infusion at the [facility] due to; Specific high risk criteria : Cardiovascular disease or hypertension and Other high risk medical condition per CDC:  high SVI, unvaccintaed   Unable to reach pt. Phone number listed does not work due to "restrictions on this line."  Angelena Form PA-C

## 2020-06-11 NOTE — ED Provider Notes (Signed)
  [place]   [id number] 06/09/20 Arrival Time: 1059  ASSESSMENT & PLAN:  1. Viral upper respiratory tract infection     COVID-19 testing sent. OTC symptom care as needed.    Follow-up Information    [name], MD.   Specialty: Family Medicine Why: As needed. Contact information: [address] [place] [address] [phone]               Reviewed expectations re: course of current medical issues. Questions answered. Outlined signs and symptoms indicating need for more acute intervention. Understanding verbalized. After Visit Summary given.   SUBJECTIVE: History from: patient. Brad [name] is a 64 y.o. male who presents with worries regarding COVID-19. Known COVID-19 contact: none. Recent travel: none. Reports: cold symptoms for 3-4 days; decreased taste/smell; chills. Denies: fever and difficulty breathing. Normal PO intake without n/v/d.    OBJECTIVE:  Vitals:   06/09/20 1210 06/09/20 1212  BP:  (!) 109/56  Pulse:  82  Resp:  16  Temp:  (!) 97.3 F (36.3 C)  TempSrc:  Oral  SpO2:  99%  Weight: 72.6 kg   Height: 6' (1.829 m)     General appearance: alert; no distress Eyes: PERRLA; EOMI; conjunctiva normal HENT: [employer]; AT; with nasal congestion Neck: supple  Lungs: speaks full sentences without difficulty; unlabored Extremities: no edema Skin: warm and dry Neurologic: normal gait Psychological: alert and cooperative; normal mood and affect   No Known Allergies  Past Medical History:  Diagnosis Date  . Gout   . Hypertension    Social History   Socioeconomic History  . Marital status: Single    Spouse name: Not on file  . Number of children: Not on file  . Years of education: Not on file  . Highest education level: Not on file  Occupational History  . Not on file  Tobacco Use  . Smoking status: Current Every Day Smoker    Packs/day: 0.15    Types: Cigarettes  . Smokeless tobacco: Never Used  Vaping Use  .  Vaping Use: Never used  Substance and Sexual Activity  . Alcohol use: Not Currently    Comment: none x 2 wks  . Drug use: No  . Sexual activity: Not on file  Other Topics Concern  . Not on file  Social History Narrative  . Not on file   Social Determinants of Health   Financial Resource Strain: Not on file  Food Insecurity: Not on file  Transportation Needs: Not on file  Physical Activity: Not on file  Stress: Not on file  Social Connections: Not on file  Intimate Partner Violence: Not on file   Family History  Problem Relation Age of Onset  . Diabetes Mother    History reviewed. No pertinent surgical history.   Vanessa Kick, MD 06/11/20 0930

## 2021-01-27 ENCOUNTER — Other Ambulatory Visit: Payer: Self-pay

## 2021-01-27 ENCOUNTER — Encounter: Payer: Self-pay | Admitting: Internal Medicine

## 2021-01-27 ENCOUNTER — Ambulatory Visit: Payer: No Typology Code available for payment source | Attending: Internal Medicine | Admitting: Internal Medicine

## 2021-01-27 VITALS — BP 157/88 | HR 73 | Resp 16 | Ht 72.0 in | Wt 155.6 lb

## 2021-01-27 DIAGNOSIS — Z716 Tobacco abuse counseling: Secondary | ICD-10-CM | POA: Insufficient documentation

## 2021-01-27 DIAGNOSIS — M1A9XX1 Chronic gout, unspecified, with tophus (tophi): Secondary | ICD-10-CM | POA: Insufficient documentation

## 2021-01-27 DIAGNOSIS — I499 Cardiac arrhythmia, unspecified: Secondary | ICD-10-CM | POA: Insufficient documentation

## 2021-01-27 DIAGNOSIS — I10 Essential (primary) hypertension: Secondary | ICD-10-CM | POA: Insufficient documentation

## 2021-01-27 DIAGNOSIS — Z7689 Persons encountering health services in other specified circumstances: Secondary | ICD-10-CM

## 2021-01-27 DIAGNOSIS — I4891 Unspecified atrial fibrillation: Secondary | ICD-10-CM | POA: Insufficient documentation

## 2021-01-27 DIAGNOSIS — D638 Anemia in other chronic diseases classified elsewhere: Secondary | ICD-10-CM | POA: Insufficient documentation

## 2021-01-27 DIAGNOSIS — F17210 Nicotine dependence, cigarettes, uncomplicated: Secondary | ICD-10-CM | POA: Diagnosis not present

## 2021-01-27 DIAGNOSIS — D649 Anemia, unspecified: Secondary | ICD-10-CM | POA: Insufficient documentation

## 2021-01-27 DIAGNOSIS — F17200 Nicotine dependence, unspecified, uncomplicated: Secondary | ICD-10-CM

## 2021-01-27 MED ORDER — COLCHICINE 0.6 MG PO TABS
0.6000 mg | ORAL_TABLET | Freq: Every day | ORAL | 1 refills | Status: DC
Start: 2021-01-27 — End: 2021-01-29

## 2021-01-27 MED ORDER — PREDNISONE 20 MG PO TABS: ORAL_TABLET | ORAL | 0 refills | Status: DC

## 2021-01-27 MED ORDER — AMLODIPINE BESYLATE 5 MG PO TABS: 5.0000 mg | ORAL_TABLET | Freq: Every day | ORAL | 3 refills | Status: DC

## 2021-01-27 NOTE — Progress Notes (Signed)
Pt states his pain is coming from his gout. Pt states he has gout in his b/l hands and he hasn't taken any medications

## 2021-01-27 NOTE — Patient Instructions (Signed)
We have started you on a medication called prednisone to treat the acute gout attack.  After you have finished with the prednisone you should start the colchicine.  I have referred you to the rheumatologist.  Your heart is in an abnormal rhythm called atrial fibrillation.  I have referred you to the cardiologist. Atrial Fibrillation  Atrial fibrillation is a type of heartbeat that is irregular or fast. If you have this condition, your heart beats without any order. This makes it hard foryour heart to pump blood in a normal way. Atrial fibrillation may come and go, or it may become a long-lasting problem. If this condition is not treated, it can put you at higher risk for stroke,heart failure, and other heart problems. What are the causes? This condition may be caused by diseases that damage the heart. They include: High blood pressure. Heart failure. Heart valve disease. Heart surgery. Other causes include: Diabetes. Thyroid disease. Being overweight. Kidney disease. Sometimes the cause is not known. What increases the risk? You are more likely to develop this condition if: You are older. You smoke. You exercise often and very hard. You have a family history of this condition. You are a man. You use drugs. You drink a lot of alcohol. You have lung conditions, such as emphysema, pneumonia, or COPD. You have sleep apnea. What are the signs or symptoms? Common symptoms of this condition include: A feeling that your heart is beating very fast. Chest pain or discomfort. Feeling short of breath. Suddenly feeling light-headed or weak. Getting tired easily during activity. Fainting. Sweating. In some cases, there are no symptoms. How is this treated? Treatment for this condition depends on underlying conditions and how you feel when you have atrial fibrillation. They include: Medicines to: Prevent blood clots. Treat heart rate or heart rhythm problems. Using devices, such as a  pacemaker, to correct heart rhythm problems. Doing surgery to remove the part of the heart that sends bad signals. Closing an area where clots can form in the heart (left atrial appendage). In some cases, your doctor will treat other underlying conditions. Follow these instructions at home: Medicines Take over-the-counter and prescription medicines only as told by your doctor. Do not take any new medicines without first talking to your doctor. If you are taking blood thinners: Talk with your doctor before you take any medicines that have aspirin or NSAIDs, such as ibuprofen, in them. Take your medicine exactly as told by your doctor. Take it at the same time each day. Avoid activities that could hurt or bruise you. Follow instructions about how to prevent falls. Wear a bracelet that says you are taking blood thinners. Or, carry a card that lists what medicines you take. Lifestyle     Do not use any products that have nicotine or tobacco in them. These include cigarettes, e-cigarettes, and chewing tobacco. If you need help quitting, ask your doctor. Eat heart-healthy foods. Talk with your doctor about the right eating plan for you. Exercise regularly as told by your doctor. Do not drink alcohol. Lose weight if you are overweight. Do not use drugs, including cannabis. General instructions If you have a condition that causes breathing to stop for a short period of time (apnea), treat it as told by your doctor. Keep a healthy weight. Do not use diet pills unless your doctor says they are safe for you. Diet pills may make heart problems worse. Keep all follow-up visits as told by your doctor. This is important. Contact a  doctor if: You notice a change in the speed, rhythm, or strength of your heartbeat. You are taking a blood-thinning medicine and you get more bruising. You get tired more easily when you move or exercise. You have a sudden change in weight. Get help right away if:  You  have pain in your chest or your belly (abdomen). You have trouble breathing. You have side effects of blood thinners, such as blood in your vomit, poop (stool), or pee (urine), or bleeding that cannot stop. You have any signs of a stroke. "BE FAST" is an easy way to remember the main warning signs: B - Balance. Signs are dizziness, sudden trouble walking, or loss of balance. E - Eyes. Signs are trouble seeing or a change in how you see. F - Face. Signs are sudden weakness or loss of feeling in the face, or the face or eyelid drooping on one side. A - Arms. Signs are weakness or loss of feeling in an arm. This happens suddenly and usually on one side of the body. S - Speech. Signs are sudden trouble speaking, slurred speech, or trouble understanding what people say. T - Time. Time to call emergency services. Write down what time symptoms started. You have other signs of a stroke, such as: A sudden, very bad headache with no known cause. Feeling like you may vomit (nausea). Vomiting. A seizure. These symptoms may be an emergency. Do not wait to see if the symptoms will go away. Get medical help right away. Call your local emergency services (911 in the [place] Do not drive yourself to the hospital. Summary Atrial fibrillation is a type of heartbeat that is irregular or fast. You are at higher risk of this condition if you smoke, are older, have diabetes, or are overweight. Follow your doctor's instructions about medicines, diet, exercise, and follow-up visits. Get help right away if you have signs or symptoms of a stroke. Get help right away if you cannot catch your breath, or you have chest pain or discomfort. This information is not intended to replace advice given to you by your health care provider. Make sure you discuss any questions you have with your healthcare provider. Document Revised: 11/29/2018 Document Reviewed: 11/29/2018 Elsevier Patient Education  [place].

## 2021-01-27 NOTE — Progress Notes (Addendum)
Patient ID: Brad [name], male    DOB: [birth year]  MRN: [id number]  CC: New Patient (Initial Visit), Gout, and Hypertension   Subjective: Brad [name] is a 65 y.o. male who presents for new pt visit His concerns today include:  Pt with hx of HTN, gout, tob dep, chronic anemia  Previous PCP was Dr. [name] with [facility].  Last seen 2 yrs ago.  Tried to get back in recently but had missed to many  appts. Hx of gout and HTN. Off BP meds since April when he ran out.  He was on Lisinopril and Norvasc. No device to check BP.  Tries to limit salt. No CP/SOB.  +LE edema in RT leg which he attributes to gout.   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 Jts affected includes hands, knees (RT>LT), feet/ankles.   Currently having a flare since June 2022. Joint that are most painful at this time is the RT knee  Hands deformed x 3 mths.  Taking Tylenol and Ibuprofen in a.m for past 1 mth.  He was on Colchicine in past.  Thinks he was on Allopurinol also but not sure. 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  Drinks wine cooler occasionally.  Tries to limit red meat.   Tob dep: smoke 1/2 pk/day; was up to 1 pk a day until 4-5 yrs ago.  Smoked since age 70.  Never quit. Wants to quit but not now.    Chronic anemia noted on chart: no blood in stools.  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   No current facility-administered medications on file prior to visit.    No Known Allergies  Social History   Socioeconomic History   Marital status: Single     Spouse name: Not on file   Number of children: Not on file   Years of education: Not on file   Highest education level: Not on file  Occupational History   Not on file  Tobacco Use   Smoking status: Every Day    Packs/day: 0.15    Types: Cigarettes   Smokeless tobacco: Never  Vaping Use   Vaping Use: Never used  Substance and Sexual Activity   Alcohol use: Not Currently    Comment: none x 2 wks   Drug use: No   Sexual activity: Not on file  Other Topics Concern   Not on file  Social History Narrative   Not on file   Social Determinants of Health   Financial Resource Strain: Not on file  Food Insecurity: Not on file  Transportation Needs: Not on file  Physical Activity: Not on file  Stress: Not on file  Social Connections: Not on file  Intimate Partner Violence: Not on file    Family History  Problem Relation Age of Onset   Diabetes Mother     No past surgical history on file.  ROS: Review of Systems Negative except as stated above  PHYSICAL EXAM: BP (!) 157/88 (BP Location: Right Arm, Patient Position: Sitting, Cuff Size: Small)  Pulse 73   Resp 16   Ht 6' (1.829 m)   Wt 155 lb 9.6 oz (70.6 kg)   SpO2 98%   BMI 21.10 kg/m   Physical Exam   General appearance - alert, well appearing, older AAM and in no distress Mental status - normal mood, behavior, speech, dress, motor activity, and thought processes Eyes - slightly pale conjunctiva, Mouth - mucous membranes moist, pharynx normal without lesions.  He has only several teeth in his mouth.  Some broken off in the gum Neck - supple, no significant adenopathy.  No thyroid enlargement or thyroid thyroid nodules palpated. Lymphatics - no palpable lymphadenopathy.  He has a large sebaceous cyst the right axilla Chest - clear to auscultation, no wheezes, rales or rhonchi, symmetric air entry Heart - irregularly irregular heart rate that is increased at times.  No Murmurs Musculoskeletal -noted enlargement of  hand and knee joints with tophus.  Ankle joints are also enlarged RT>LT. extremities - nonpitting edema RT lower leg   Depression screen [facility] 2/9 01/27/2021  Decreased Interest 0  Down, Depressed, Hopeless 0  PHQ - 2 Score 0   EKG: Atrial fibrillation with rate of 96 and LVH  CMP Latest Ref Rng & Units 01/27/2021 11/25/2016 11/24/2016  Glucose 65 - 99 mg/dL 96 205(H) 76  BUN 8 - 27 mg/dL '26 10 6  '$ Creatinine 0.76 - 1.27 mg/dL 2.32(H) 1.27(H) 1.28(H)  Sodium 134 - 144 mmol/L 137 132(L) 136  Potassium 3.5 - 5.2 mmol/L 4.3 4.5 4.2  Chloride 96 - 106 mmol/L 106 101 102  CO2 20 - 29 mmol/L 14(L) 22 22  Calcium 8.6 - 10.2 mg/dL 8.9 9.0 9.1  Total Protein 6.0 - 8.5 g/dL 6.5 - -  Total Bilirubin 0.0 - 1.2 mg/dL <0.2 - -  Alkaline Phos 44 - 121 IU/L 43(L) - -  AST 0 - 40 IU/L 10 - -  ALT 0 - 44 IU/L 5 - -   Lipid Panel     Component Value Date/Time   CHOL 94 (L) 01/27/2021 1010   TRIG 65 01/27/2021 1010   HDL 43 01/27/2021 1010   CHOLHDL 2.2 01/27/2021 1010   LDLCALC 37 01/27/2021 1010    CBC    Component Value Date/Time   WBC 13.7 (H) 01/27/2021 1010   WBC 8.6 11/25/2016 0356   RBC 2.72 (LL) 01/27/2021 1010   RBC 3.68 (L) 11/25/2016 0356   HGB 8.1 (L) 01/27/2021 1010   HCT 23.9 (L) 01/27/2021 1010   PLT 707 (H) 01/27/2021 1010   MCV 88 01/27/2021 1010   MCH 29.8 01/27/2021 1010   MCH 31.3 11/25/2016 0356   MCHC 33.9 01/27/2021 1010   MCHC 34.2 11/25/2016 0356   RDW 14.5 01/27/2021 1010   LYMPHSABS 1.3 11/22/2016 0350   MONOABS 0.6 11/22/2016 0350   EOSABS 0.2 11/22/2016 0350   BASOSABS 0.0 11/22/2016 0350    ASSESSMENT AND PLAN: 1. Establishing care with new doctor, encounter for   2. Essential hypertension Not at goal.  Start amlodipine - CBC - Comprehensive metabolic panel - Lipid panel  3. Chronic tophaceous gout Patient with significant deformity of joints due to tophaceous gout. Currently having a flare.  Prednisone taper prescribed.  After he is done with the  prednisone he will start colchicine 0.6 mg daily.  We discussed starting allopurinol once he is done with prednisone.  I would like to see what his kidney function looks like first on chemistry ordered today.  Advised that  allopurinol very rarely can cause a very significant rash Remo Lipps Lalaine Overstreet's syndrome).  If we do start him on Allopurinol, pt advised to stop medication if he develops any rash and come in for an urgent care visit.  We will get him in with a rheumatologist. - Uric Acid - Ambulatory referral to Rheumatology  4. Tobacco dependence Advised to quit.  Patient states that he wants to quit but is not ready to do so at this time.  We discussed health risks associated with smoking.  5. Cardiac arrhythmia, unspecified cardiac arrhythmia type Patient in atrial fibrillation.  He denies any previous history of stroke or heart attack.  His CHA2DS2-VASc score is 1 which puts him at an annual risk of stroke of 0.6%.  I discussed diagnosis of atrial fibrillation with him and the significance of this diagnosis that it increases risk for stroke.  We discussed aspirin versus anticoagulation.  I think I will go with aspirin for now and refer him to cardiology. - TSH+T4F+T3Free - EKG 12-Lead - Ambulatory referral to Cardiology  6. Chronic anemia Check CBC today when we do his baseline blood test.  He is currently asymptomatic.   Patient was given the opportunity to ask questions.  Patient verbalized understanding of the plan and was able to repeat key elements of the plan.   Addendum 8/10/222:  pt with worsening anemia.  Will add iron studies, Vit B12 and Folate levels. Orders Placed This Encounter  Procedures   CBC   Comprehensive metabolic panel   Lipid panel   Uric Acid   TSH+T4F+T3Free   Ambulatory referral to Rheumatology   Ambulatory referral to Cardiology   EKG 12-Lead     Requested Prescriptions   Signed Prescriptions Disp Refills   predniSONE (DELTASONE) 20 MG tablet 20  tablet 0    Sig: 2 tabs PO daily x 4 days then 1.5 daily x 4 days then 1 x 4 days then 1/2 x 4 days   colchicine 0.6 MG tablet 30 tablet 1    Sig: Take 1 tablet (0.6 mg total) by mouth daily. Start after completing Prednisone   amLODipine (NORVASC) 5 MG tablet 90 tablet 3    Sig: Take 1 tablet (5 mg total) by mouth daily.    Return in about 2 months (around 03/29/2021).  Karle Plumber, MD, FACP

## 2021-01-28 ENCOUNTER — Telehealth: Payer: Self-pay | Admitting: Internal Medicine

## 2021-01-28 LAB — COMPREHENSIVE METABOLIC PANEL
ALT: 5 IU/L (ref 0–44)
AST: 10 IU/L (ref 0–40)
Albumin/Globulin Ratio: 1.2 (ref 1.2–2.2)
Albumin: 3.5 g/dL — ABNORMAL LOW (ref 3.8–4.8)
Alkaline Phosphatase: 43 IU/L — ABNORMAL LOW (ref 44–121)
BUN/Creatinine Ratio: 11 (ref 10–24)
BUN: 26 mg/dL (ref 8–27)
Bilirubin Total: <0.2 mg/dL (ref 0.0–1.2)
CO2: 14 mmol/L — ABNORMAL LOW (ref 20–29)
Calcium: 8.9 mg/dL (ref 8.6–10.2)
Chloride: 106 mmol/L (ref 96–106)
Creatinine, Ser: 2.32 mg/dL — ABNORMAL HIGH (ref 0.76–1.27)
Globulin, Total: 3 g/dL (ref 1.5–4.5)
Glucose: 96 mg/dL (ref 65–99)
Potassium: 4.3 mmol/L (ref 3.5–5.2)
Sodium: 137 mmol/L (ref 134–144)
Total Protein: 6.5 g/dL (ref 6.0–8.5)
eGFR: 31 mL/min/{1.73_m2} — ABNORMAL LOW (ref >59)

## 2021-01-28 LAB — CBC
Hematocrit: 23.9 % — ABNORMAL LOW (ref 37.5–51.0)
Hemoglobin: 8.1 g/dL — ABNORMAL LOW (ref 13.0–17.7)
MCH: 29.8 pg (ref 26.6–33.0)
MCHC: 33.9 g/dL (ref 31.5–35.7)
MCV: 88 fL (ref 79–97)
Platelets: 707 10*3/uL — ABNORMAL HIGH (ref 150–450)
RBC: 2.72 x10E6/uL — CL (ref 4.14–5.80)
RDW: 14.5 % (ref 11.6–15.4)
WBC: 13.7 10*3/uL — ABNORMAL HIGH (ref 3.4–10.8)

## 2021-01-28 LAB — LIPID PANEL
Chol/HDL Ratio: 2.2 ratio (ref 0.0–5.0)
Cholesterol, Total: 94 mg/dL — ABNORMAL LOW (ref 100–199)
HDL: 43 mg/dL (ref >39)
LDL Chol Calc (NIH): 37 mg/dL (ref 0–99)
Triglycerides: 65 mg/dL (ref 0–149)
VLDL Cholesterol Cal: 14 mg/dL (ref 5–40)

## 2021-01-28 LAB — URIC ACID: Uric Acid: 8.7 mg/dL — ABNORMAL HIGH (ref 3.8–8.4)

## 2021-01-28 LAB — TSH+T4F+T3FREE
Free T4: 0.99 ng/dL (ref 0.82–1.77)
T3, Free: 1.8 pg/mL — ABNORMAL LOW (ref 2.0–4.4)
TSH: 4.77 u[IU]/mL — ABNORMAL HIGH (ref 0.450–4.500)

## 2021-01-28 NOTE — Telephone Encounter (Signed)
Phone call placed to patient today.  I left a message on his voicemail informing him that I was calling to go over his lab results.  Informed that he is anemic and I will have the lab add additional studies including iron level.  His other blood cell counts are also abnormal.  Kidney function is poor and worse compared to when it was last checked in 2018.  Thyroid level is off.  Advised that I will try to reach him again later.

## 2021-01-28 NOTE — Telephone Encounter (Signed)
Pt returned Dr. Durenda Age call/ advised she will try to reach him again later

## 2021-01-28 NOTE — Addendum Note (Signed)
Addended by: Karle Plumber B on: 01/28/2021 12:21 PM   Modules accepted: Orders

## 2021-01-29 ENCOUNTER — Telehealth: Payer: Self-pay | Admitting: Internal Medicine

## 2021-01-29 DIAGNOSIS — N1832 Chronic kidney disease, stage 3b: Secondary | ICD-10-CM

## 2021-01-29 DIAGNOSIS — D649 Anemia, unspecified: Secondary | ICD-10-CM

## 2021-01-29 DIAGNOSIS — R7989 Other specified abnormal findings of blood chemistry: Secondary | ICD-10-CM

## 2021-01-29 NOTE — Telephone Encounter (Signed)
Patient aware of message from Dr. [name].   He has question about medication- Prednisone and Colchicine He is unable to afford the Colchicine. It is $100 at the pharmacy.  Phamacy recommends a generic or another medication in place of it.

## 2021-01-29 NOTE — Telephone Encounter (Signed)
Phone call placed to patient this evening to go over lab results. 1.  Abnormal CBC: Patient informed that his blood cell counts are off.  Anemia is worse compared to 4 years ago.   -I tried to add iron, vitamin B12 and folate to blood that was drawn 2 days ago.  Told by lab that they did not have sufficient blood to run the extra test.  Patient agrees to come to the lab tomorrow to have this done. -I recommend referral to hematology.  Patient agrees.  2.  Abnormal kidney function.  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  Worsened from 4 years ago. 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 -Advised to stop ibuprofen. -Advised to hold off on starting colchicine until we are able to repeat kidney function in several weeks to see if it is improved with stopping NSAID. -Referred to nephrology  3.  Uric acid level elevated as expected.  Patient already has appointment with rheumatologist scheduled for 03/20/2021.  He will complete prednisone taper.  4.  TSH level mildly elevated.  We will hold off on starting levothyroxine and just recheck level in 2 months.  Patient expressed understanding of the plan.

## 2021-01-29 NOTE — Telephone Encounter (Signed)
Copied from [place] [id number]. Topic: General - Other >> Jan 28, 2021 12:04 PM Bayard Beaver wrote: Reason for [id number] called says received call, not sure what this was about. Please call back

## 2021-01-30 ENCOUNTER — Other Ambulatory Visit: Payer: Self-pay

## 2021-01-30 DIAGNOSIS — D649 Anemia, unspecified: Secondary | ICD-10-CM

## 2021-01-30 DIAGNOSIS — N1832 Chronic kidney disease, stage 3b: Secondary | ICD-10-CM

## 2021-01-30 NOTE — Telephone Encounter (Signed)
Called pt made aware also gave pr contact numbers of the offices were referrals were placed.

## 2021-01-31 ENCOUNTER — Encounter: Payer: Self-pay | Admitting: Internal Medicine

## 2021-01-31 DIAGNOSIS — E538 Deficiency of other specified B group vitamins: Secondary | ICD-10-CM | POA: Insufficient documentation

## 2021-01-31 LAB — IRON,TIBC AND FERRITIN PANEL
Ferritin: 357 ng/mL (ref 30–400)
Iron Saturation: 29 % (ref 15–55)
Iron: 67 ug/dL (ref 38–169)
Total Iron Binding Capacity: 230 ug/dL — ABNORMAL LOW (ref 250–450)
UIBC: 163 ug/dL (ref 111–343)

## 2021-01-31 LAB — MICROALBUMIN / CREATININE URINE RATIO
Creatinine, Urine: 100.4 mg/dL
Microalb/Creat Ratio: 105 mg/g creat — ABNORMAL HIGH (ref 0–29)
Microalbumin, Urine: 105.1 ug/mL

## 2021-01-31 LAB — FOLATE: Folate: 4.2 ng/mL (ref >3.0)

## 2021-01-31 LAB — VITAMIN B12: Vitamin B-12: 263 pg/mL (ref 232–1245)

## 2021-01-31 NOTE — Progress Notes (Signed)
Let patient know that his vitamin B12 level is low.  Folic acid level is in the low normal range.  Both of these vitamins help to keep the nervous system functioning well.  I recommend purchasing vitamin B12 1000 mcg and takie1 tablet daily.  Purchase folic acid 1 mg and take 1 tablet daily.  Please make sure patient writes down these names and dosages so that he can purchase over-the-counter.

## 2021-02-04 ENCOUNTER — Telehealth: Payer: Self-pay | Admitting: Physician Assistant

## 2021-02-04 NOTE — Telephone Encounter (Signed)
Received a new hem referral from Dr. [name] for abnl cbc. [name] has been cld and scheduled to see Murray Hodgkins on 8/26 at 1pm. Pt aware to arrive 20 minutes early.

## 2021-02-12 NOTE — Progress Notes (Deleted)
[place] Telephone:([phone]   [phone]  INITIAL CONSULT NOTE  Patient Care Team: Ladell Pier, MD as PCP - General (Internal Medicine)  Hematological/Oncological History # ***  CHIEF COMPLAINTS/PURPOSE OF CONSULTATION:  "*** "  HISTORY OF PRESENTING ILLNESS:  Brad [name] 65 y.o. male with medical history significant for ***  On review of the previous records ***  On exam today ***  MEDICAL HISTORY:  Past Medical History:  Diagnosis Date   Gout    Hypertension     SURGICAL HISTORY: No past surgical history on file.  SOCIAL HISTORY: Social History   Socioeconomic History   Marital status: Single    Spouse name: Not on file   Number of children: Not on file   Years of education: Not on file   Highest education level: Not on file  Occupational History   Not on file  Tobacco Use   Smoking status: Every Day    Packs/day: 0.15    Types: Cigarettes   Smokeless tobacco: Never  Vaping Use   Vaping Use: Never used  Substance and Sexual Activity   Alcohol use: Not Currently    Comment: none x 2 wks   Drug use: No   Sexual activity: Not on file  Other Topics Concern   Not on file  Social History Narrative   Not on file   Social Determinants of Health   Financial Resource Strain: Not on file  Food Insecurity: Not on file  Transportation Needs: Not on file  Physical Activity: Not on file  Stress: Not on file  Social Connections: Not on file  Intimate Partner Violence: Not on file    FAMILY HISTORY: Family History  Problem Relation Age of Onset   Diabetes Mother     ALLERGIES:  has No Known Allergies.  MEDICATIONS:  Current Outpatient Medications  Medication Sig Dispense Refill   amLODipine (NORVASC) 5 MG tablet Take 1 tablet (5 mg total) by mouth daily. 90 tablet 3   predniSONE (DELTASONE) 20 MG tablet 2 tabs PO daily x 4 days then 1.5 daily x 4 days then 1 x 4 days then 1/2 x 4 days 20 tablet 0   No  current facility-administered medications for this visit.    REVIEW OF SYSTEMS:   Constitutional: ( - ) fevers, ( - )  chills , ( - ) night sweats Eyes: ( - ) blurriness of vision, ( - ) double vision, ( - ) watery eyes Ears, nose, mouth, throat, and face: ( - ) mucositis, ( - ) sore throat Respiratory: ( - ) cough, ( - ) dyspnea, ( - ) wheezes Cardiovascular: ( - ) palpitation, ( - ) chest discomfort, ( - ) lower extremity swelling Gastrointestinal:  ( - ) nausea, ( - ) heartburn, ( - ) change in bowel habits Skin: ( - ) abnormal skin rashes Lymphatics: ( - ) new lymphadenopathy, ( - ) easy bruising Neurological: ( - ) numbness, ( - ) tingling, ( - ) new weaknesses Behavioral/Psych: ( - ) mood change, ( - ) new changes  All other systems were reviewed with the patient and are negative.  PHYSICAL EXAMINATION: ECOG PERFORMANCE STATUS: {CHL ONC ECOG [id number]}  There were no vitals filed for this visit. There were no vitals filed for this visit.  GENERAL: well appearing *** in NAD  SKIN: skin color, texture, turgor are normal, no rashes or significant lesions EYES: conjunctiva are pink and non-injected, sclera clear OROPHARYNX: no  exudate, no erythema; lips, buccal mucosa, and tongue normal  NECK: supple, non-tender LYMPH:  no palpable lymphadenopathy in the cervical, axillary or supraclavicular lymph nodes.  LUNGS: clear to auscultation and percussion with normal breathing effort HEART: regular rate & rhythm and no murmurs and no lower extremity edema ABDOMEN: soft, non-tender, non-distended, normal bowel sounds Musculoskeletal: no cyanosis of digits and no clubbing  PSYCH: alert & oriented x 3, fluent speech NEURO: no focal motor/sensory deficits  LABORATORY DATA:  I have reviewed the data as listed CBC Latest Ref Rng & Units 01/27/2021 11/25/2016 11/24/2016  WBC 3.4 - 10.8 x10E3/uL 13.7(H) 8.6 7.7  Hemoglobin 13.0 - 17.7 g/dL 8.1(L) 11.5(L) 10.2(L)  Hematocrit 37.5 - 51.0 %  23.9(L) 33.6(L) 30.1(L)  Platelets 150 - 450 x10E3/uL 707(H) 532(H) 489(H)    CMP Latest Ref Rng & Units 01/27/2021 11/25/2016 11/24/2016  Glucose 65 - 99 mg/dL 96 205(H) 76  BUN 8 - 27 mg/dL '26 10 6  '$ Creatinine 0.76 - 1.27 mg/dL 2.32(H) 1.27(H) 1.28(H)  Sodium 134 - 144 mmol/L 137 132(L) 136  Potassium 3.5 - 5.2 mmol/L 4.3 4.5 4.2  Chloride 96 - 106 mmol/L 106 101 102  CO2 20 - 29 mmol/L 14(L) 22 22  Calcium 8.6 - 10.2 mg/dL 8.9 9.0 9.1  Total Protein 6.0 - 8.5 g/dL 6.5 - -  Total Bilirubin 0.0 - 1.2 mg/dL <0.2 - -  Alkaline Phos 44 - 121 IU/L 43(L) - -  AST 0 - 40 IU/L 10 - -  ALT 0 - 44 IU/L 5 - -     PATHOLOGY: ***  BLOOD FILM: *** Review of the peripheral blood smear showed normal appearing white cells with neutrophils that were appropriately lobated and granulated. There was no predominance of bi-lobed or hyper-segmented neutrophils appreciated. No Dohle bodies were noted. There was no left shifting, immature forms or blasts noted. Lymphocytes remain normal in size without any predominance of large granular lymphocytes. Red cells show no anisopoikilocytosis, macrocytes , microcytes or polychromasia. There were no schistocytes, target cells, echinocytes, acanthocytes, dacrocytes, or stomatocytes.There was no rouleaux formation, nucleated red cells, or intra-cellular inclusions noted. The platelets are normal in size, shape, and color without any clumping evident.  RADIOGRAPHIC STUDIES: I have personally reviewed the radiological images as listed and agreed with the findings in the report. No results found.  ASSESSMENT & PLAN ***  No orders of the defined types were placed in this encounter.   All questions were answered. The patient knows to call the clinic with any problems, questions or concerns.  I have spent a total of {CHL ONC TIME VISIT - [id number] minutes of face-to-face and non-face-to-face time, preparing to see the patient, obtaining and/or reviewing separately  obtained history, performing a medically appropriate examination, counseling and educating the patient, ordering medications/tests/procedures, referring and communicating with other health care professionals, documenting clinical information in the electronic health record, independently interpreting results and communicating results to the patient, and care coordination.   Dede Query, PA-C Department of Hematology/Oncology [place] [facility] Phone: [phone]

## 2021-02-13 ENCOUNTER — Inpatient Hospital Stay: Payer: No Typology Code available for payment source

## 2021-02-13 ENCOUNTER — Encounter: Payer: No Typology Code available for payment source | Admitting: Physician Assistant

## 2021-02-13 ENCOUNTER — Telehealth: Payer: Self-pay | Admitting: Physician Assistant

## 2021-02-13 ENCOUNTER — Other Ambulatory Visit: Payer: No Typology Code available for payment source

## 2021-02-13 ENCOUNTER — Inpatient Hospital Stay
Payer: No Typology Code available for payment source | Attending: Physician Assistant | Admitting: Hematology and Oncology

## 2021-02-13 ENCOUNTER — Other Ambulatory Visit: Payer: Self-pay

## 2021-02-13 ENCOUNTER — Telehealth: Payer: Self-pay | Admitting: *Deleted

## 2021-02-13 VITALS — BP 162/85 | HR 89 | Temp 98.6°F | Resp 19 | Ht 72.0 in | Wt 152.3 lb

## 2021-02-13 DIAGNOSIS — F17210 Nicotine dependence, cigarettes, uncomplicated: Secondary | ICD-10-CM | POA: Diagnosis not present

## 2021-02-13 DIAGNOSIS — I10 Essential (primary) hypertension: Secondary | ICD-10-CM | POA: Insufficient documentation

## 2021-02-13 DIAGNOSIS — D649 Anemia, unspecified: Secondary | ICD-10-CM | POA: Diagnosis not present

## 2021-02-13 DIAGNOSIS — D638 Anemia in other chronic diseases classified elsewhere: Secondary | ICD-10-CM

## 2021-02-13 DIAGNOSIS — E119 Type 2 diabetes mellitus without complications: Secondary | ICD-10-CM | POA: Insufficient documentation

## 2021-02-13 DIAGNOSIS — D75839 Thrombocytosis, unspecified: Secondary | ICD-10-CM | POA: Insufficient documentation

## 2021-02-13 DIAGNOSIS — M109 Gout, unspecified: Secondary | ICD-10-CM | POA: Insufficient documentation

## 2021-02-13 DIAGNOSIS — Z79899 Other long term (current) drug therapy: Secondary | ICD-10-CM | POA: Diagnosis not present

## 2021-02-13 DIAGNOSIS — D72825 Bandemia: Secondary | ICD-10-CM | POA: Diagnosis not present

## 2021-02-13 LAB — CMP (CANCER CENTER ONLY)
ALT: 17 U/L (ref 0–44)
AST: 16 U/L (ref 15–41)
Albumin: 2.9 g/dL — ABNORMAL LOW (ref 3.5–5.0)
Alkaline Phosphatase: 60 U/L (ref 38–126)
Anion gap: 14 (ref 5–15)
BUN: 29 mg/dL — ABNORMAL HIGH (ref 8–23)
CO2: 19 mmol/L — ABNORMAL LOW (ref 22–32)
Calcium: 8.6 mg/dL — ABNORMAL LOW (ref 8.9–10.3)
Chloride: 99 mmol/L (ref 98–111)
Creatinine: 2.5 mg/dL — ABNORMAL HIGH (ref 0.61–1.24)
GFR, Estimated: 28 mL/min — ABNORMAL LOW (ref >60)
Glucose, Bld: 114 mg/dL — ABNORMAL HIGH (ref 70–99)
Potassium: 3.9 mmol/L (ref 3.5–5.1)
Sodium: 132 mmol/L — ABNORMAL LOW (ref 135–145)
Total Bilirubin: 0.5 mg/dL (ref 0.3–1.2)
Total Protein: 6.8 g/dL (ref 6.5–8.1)

## 2021-02-13 LAB — RETIC PANEL
Immature Retic Fract: 18 % — ABNORMAL HIGH (ref 2.3–15.9)
RBC.: 2.85 MIL/uL — ABNORMAL LOW (ref 4.22–5.81)
Retic Count, Absolute: 61 10*3/uL (ref 19.0–186.0)
Retic Ct Pct: 2.1 % (ref 0.4–3.1)
Reticulocyte Hemoglobin: 35.7 pg (ref >27.9)

## 2021-02-13 LAB — CBC WITH DIFFERENTIAL (CANCER CENTER ONLY)
Abs Immature Granulocytes: 0.1 10*3/uL — ABNORMAL HIGH (ref 0.00–0.07)
Basophils Absolute: 0 10*3/uL (ref 0.0–0.1)
Basophils Relative: 0 %
Eosinophils Absolute: 0.1 10*3/uL (ref 0.0–0.5)
Eosinophils Relative: 1 %
HCT: 25 % — ABNORMAL LOW (ref 39.0–52.0)
Hemoglobin: 8.7 g/dL — ABNORMAL LOW (ref 13.0–17.0)
Immature Granulocytes: 1 %
Lymphocytes Relative: 11 %
Lymphs Abs: 1.8 10*3/uL (ref 0.7–4.0)
MCH: 29.8 pg (ref 26.0–34.0)
MCHC: 34.8 g/dL (ref 30.0–36.0)
MCV: 85.6 fL (ref 80.0–100.0)
Monocytes Absolute: 0.4 10*3/uL (ref 0.1–1.0)
Monocytes Relative: 2 %
Neutro Abs: 14.2 10*3/uL — ABNORMAL HIGH (ref 1.7–7.7)
Neutrophils Relative %: 85 %
Platelet Count: 288 10*3/uL (ref 150–400)
RBC: 2.92 MIL/uL — ABNORMAL LOW (ref 4.22–5.81)
RDW: 16.8 % — ABNORMAL HIGH (ref 11.5–15.5)
WBC Count: 16.7 10*3/uL — ABNORMAL HIGH (ref 4.0–10.5)
nRBC: 0 % (ref 0.0–0.2)

## 2021-02-13 LAB — LACTATE DEHYDROGENASE: LDH: 215 U/L — ABNORMAL HIGH (ref 98–192)

## 2021-02-13 LAB — FOLATE: Folate: 8.3 ng/mL (ref >5.9)

## 2021-02-13 LAB — VITAMIN B12: Vitamin B-12: 95 pg/mL — ABNORMAL LOW (ref 180–914)

## 2021-02-13 LAB — C-REACTIVE PROTEIN: CRP: 9.3 mg/dL — ABNORMAL HIGH (ref <1.0)

## 2021-02-13 LAB — SEDIMENTATION RATE: Sed Rate: 69 mm/hr — ABNORMAL HIGH (ref 0–16)

## 2021-02-13 NOTE — Telephone Encounter (Signed)
R/s new patient appt with different provider due to original provider being out of office today. Called and spoke with patient. Confirmed new time

## 2021-02-13 NOTE — Telephone Encounter (Signed)
Patient states he was unable to afford the colchicine. This maybe be cheaper at [facility] pharmacy. Please advise, send to pharmacy.

## 2021-02-14 LAB — ERYTHROPOIETIN: Erythropoietin: 20 m[IU]/mL — ABNORMAL HIGH (ref 2.6–18.5)

## 2021-02-16 NOTE — Telephone Encounter (Signed)
Contacted pt to go over provider response pt is aware and doesn't have any questions or concerns  

## 2021-02-19 ENCOUNTER — Telehealth: Payer: Self-pay | Admitting: *Deleted

## 2021-02-19 ENCOUNTER — Other Ambulatory Visit: Payer: Self-pay | Admitting: *Deleted

## 2021-02-19 MED ORDER — VITAMIN B-12 1000 MCG PO TABS: 1000.0000 ug | ORAL_TABLET | Freq: Every day | ORAL | 3 refills | Status: DC

## 2021-02-19 NOTE — Telephone Encounter (Signed)
-----   Message from Orson Slick, MD sent at 02/18/2021  8:07 AM EDT ----- Please let Brad [name] know that his labs show marked inflammation (likely from his gout) and vitamin b12 deficiency. Would recommend vitamin b12 1028mg PO daily called into a pharmacy of his choosing. We will see him back in 3 months time to see how it is working.  ----- Message ----- From: [place] 02/13/2021   3:51 PM EDT To: JOrson Slick MD

## 2021-02-19 NOTE — Telephone Encounter (Signed)
TCT patient regarding recent lab results. Spoke with him and advised that his labs show inflammation likely from his gout. He also has B12 deficiency. Advised that [name] recommends B12 supplement. He is agreeable to this. Will call this to his pharamcy

## 2021-02-24 ENCOUNTER — Telehealth: Payer: Self-pay | Admitting: Hematology and Oncology

## 2021-02-24 ENCOUNTER — Encounter: Payer: Self-pay | Admitting: Hematology and Oncology

## 2021-02-24 NOTE — Progress Notes (Signed)
[place] [phone]   Fax:([phone] [place] NOTE  Patient Care Team: Ladell Pier, MD as PCP - General (Internal Medicine)  Hematological/Oncological History # Leukocytosis/Thrombocytosis # Normocytic Anemia 11/25/2016: WBC 8.6, Hgb 11.5, MCV 91.3, Plt 532 01/27/2021: WBC 13.7, Hgb 8.1, MCV 88, Plt 707 02/13/2021: establish care with [name]  CHIEF COMPLAINTS/PURPOSE OF CONSULTATION:  "Leukocytosis/Thrombocytosis "  HISTORY OF PRESENTING ILLNESS:  Brad [name] 64 y.o. male with medical history significant for gout and hypertension who presents for evaluation of leukocytosis/thrombocytosis.   On review of the previous records Brad [name] has a history of anemia and thrombocytosis dating back to at least 11/25/2016.  At that time he has not hemoglobin 11.5, and a platelet count of 532.  His white blood cell count at 8.6.  More recently on 01/27/2021 patient is under white blood cell count 13.7, hemoglobin 8.1, MCV of 88, platelet count of 707.  Due to concern for these findings the patient was referred to hematology for further evaluation management.  On exam today Brad [name] is accompanied by his son.  He notes that he has severe gout in his hands but has not sought treatment for this ever.  He is currently scheduled to see rheumatology.  He notes that he recently established with a PCP who started him on prednisone for these findings.  He notes that he finished a steroid pulse on Sunday.  He has not taken any other medications.  He does endorse being a smoker smoking approximately 1/2 pack/day.  He does drink alcohol approximately 1 to 1 L per night.  He currently works in Engineer, production.  His family history is remarkable for a unknown blood issue in his mother, and type 2 diabetes in his sister.  He has no other remarkable family medical history.  On further discussion Brad [name] endorses having some shortness of  breath when walking up steps.  He notes he is not having any issues with bleeding, bruising, or dark stools.  He has never had a colonoscopy scheduled.  He also has not established care with a kidney doctor.  He otherwise denies any fevers, chills, sweats, nausea vomiting or diarrhea.  Full 10 point ROS is listed below.  MEDICAL HISTORY:  Past Medical History:  Diagnosis Date   Gout    Hypertension     SURGICAL HISTORY: No past surgical history on file.  SOCIAL HISTORY: Social History   Socioeconomic History   Marital status: Single    Spouse name: Not on file   Number of children: Not on file   Years of education: Not on file   Highest education level: Not on file  Occupational History   Not on file  Tobacco Use   Smoking status: Every Day    Packs/day: 0.15    Types: Cigarettes   Smokeless tobacco: Never  Vaping Use   Vaping Use: Never used  Substance and Sexual Activity   Alcohol use: Not Currently    Comment: none x 2 wks   Drug use: No   Sexual activity: Not on file  Other Topics Concern   Not on file  Social History Narrative   Not on file   Social Determinants of Health   Financial Resource Strain: Not on file  Food Insecurity: Not on file  Transportation Needs: Not on file  Physical Activity: Not on file  Stress: Not on file  Social Connections: Not on file  Intimate Partner Violence:  Not on file    FAMILY HISTORY: Family History  Problem Relation Age of Onset   Diabetes Mother     ALLERGIES:  has No Known Allergies.  MEDICATIONS:  Current Outpatient Medications  Medication Sig Dispense Refill   amLODipine (NORVASC) 5 MG tablet Take 1 tablet (5 mg total) by mouth daily. 90 tablet 3   vitamin B-12 (CYANOCOBALAMIN) 1000 MCG tablet Take 1 tablet (1,000 mcg total) by mouth daily. 30 tablet 3   No current facility-administered medications for this visit.    REVIEW OF SYSTEMS:   Constitutional: ( - ) fevers, ( - )  chills , ( - ) night  sweats Eyes: ( - ) blurriness of vision, ( - ) double vision, ( - ) watery eyes Ears, nose, mouth, throat, and face: ( - ) mucositis, ( - ) sore throat Respiratory: ( - ) cough, ( - ) dyspnea, ( - ) wheezes Cardiovascular: ( - ) palpitation, ( - ) chest discomfort, ( - ) lower extremity swelling Gastrointestinal:  ( - ) nausea, ( - ) heartburn, ( - ) change in bowel habits Skin: ( - ) abnormal skin rashes Lymphatics: ( - ) new lymphadenopathy, ( - ) easy bruising Neurological: ( - ) numbness, ( - ) tingling, ( - ) new weaknesses Behavioral/Psych: ( - ) mood change, ( - ) new changes  All other systems were reviewed with the patient and are negative.  PHYSICAL EXAMINATION: ECOG PERFORMANCE STATUS: 1 - Symptomatic but completely ambulatory  Vitals:   02/13/21 1446  BP: (!) 162/85  Pulse: 89  Resp: 19  Temp: 98.6 F (37 C)  SpO2: 100%   Filed Weights   02/13/21 1446  Weight: 152 lb 4.8 oz (69.1 kg)    GENERAL: well appearing elderly African-American male in NAD  SKIN: skin color, texture, turgor are normal, no rashes or significant lesions EYES: conjunctiva are pink and non-injected, sclera clear LUNGS: clear to auscultation and percussion with normal breathing effort HEART: regular rate & rhythm and no murmurs and no lower extremity edema Musculoskeletal: marked deformity of hands bilaterally, consistent with poorly controlled gout.  PSYCH: alert & oriented x 3, fluent speech NEURO: no focal motor/sensory deficits  LABORATORY DATA:  I have reviewed the data as listed CBC Latest Ref Rng & Units 02/13/2021 01/27/2021 11/25/2016  WBC 4.0 - 10.5 K/uL 16.7(H) 13.7(H) 8.6  Hemoglobin 13.0 - 17.0 g/dL 8.7(L) 8.1(L) 11.5(L)  Hematocrit 39.0 - 52.0 % 25.0(L) 23.9(L) 33.6(L)  Platelets 150 - 400 K/uL 288 707(H) 532(H)    CMP Latest Ref Rng & Units 02/13/2021 01/27/2021 11/25/2016  Glucose 70 - 99 mg/dL 114(H) 96 205(H)  BUN 8 - 23 mg/dL 29(H) 26 10  Creatinine 0.61 - 1.24 mg/dL 2.50(H)  2.32(H) 1.27(H)  Sodium 135 - 145 mmol/L 132(L) 137 132(L)  Potassium 3.5 - 5.1 mmol/L 3.9 4.3 4.5  Chloride 98 - 111 mmol/L 99 106 101  CO2 22 - 32 mmol/L 19(L) 14(L) 22  Calcium 8.9 - 10.3 mg/dL 8.6(L) 8.9 9.0  Total Protein 6.5 - 8.1 g/dL 6.8 6.5 -  Total Bilirubin 0.3 - 1.2 mg/dL 0.5 <0.2 -  Alkaline Phos 38 - 126 U/L 60 43(L) -  AST 15 - 41 U/L 16 10 -  ALT 0 - 44 U/L 17 5 -    RADIOGRAPHIC STUDIES: I have personally reviewed the radiological images as listed and agreed with the findings in the report. No results found.  ASSESSMENT & PLAN Brad [name]  65 y.o. male with medical history significant for gout and hypertension who presents for evaluation of leukocytosis/thrombocytosis.   After review of the labs, review of the records, and discussion with the patient the patients findings are most consistent with leukocytosis, thrombocytosis, and anemia secondary to inflammation.  The patient has very poorly controlled gout of the hands with marked swelling and deformity of his fingers.  Fortunately he does have upcoming appointment with rheumatology in order to address this further.  We will rule out other possible causes by checking for nutritional deficiency and assessing his erythropoietin levels.  No indication for an MPN work-up at this time as there is a clear underlying inflammatory cause for his current findings.  # Leukocytosis/Thrombocytosis # Normocytic Anemia -- We will order nutritional studies with iron, total iron-binding capacity, ferritin, vitamin B12, and folate. --We will order ESR, CRP, and erythropoietin levels. --No clear indication for an MPN workup at this time as there is a clear causal explanation for the patient's blood findings --Return to clinic in 3 months time to reassess or sooner if there is another abnormality noted on blood work.  Orders Placed This Encounter  Procedures   CBC with Differential ([facility] Only)    Standing Status:   Future     Number of Occurrences:   1    Standing Expiration Date:   02/13/2022   CMP ([facility] only)    Standing Status:   Future    Number of Occurrences:   1    Standing Expiration Date:   02/13/2022   Lactate dehydrogenase (LDH)    Standing Status:   Future    Number of Occurrences:   1    Standing Expiration Date:   02/13/2022   Sedimentation rate    Standing Status:   Future    Number of Occurrences:   1    Standing Expiration Date:   02/13/2022   C-reactive protein    Standing Status:   Future    Number of Occurrences:   1    Standing Expiration Date:   02/13/2022   Vitamin B12    Standing Status:   Future    Number of Occurrences:   1    Standing Expiration Date:   02/13/2022   Folate, Serum    Standing Status:   Future    Number of Occurrences:   1    Standing Expiration Date:   02/13/2022   Retic Panel    Standing Status:   Future    Number of Occurrences:   1    Standing Expiration Date:   02/13/2022   Erythropoietin    Standing Status:   Future    Number of Occurrences:   1    Standing Expiration Date:   02/13/2022    All questions were answered. The patient knows to call the clinic with any problems, questions or concerns.  A total of more than 60 minutes were spent on this encounter with face-to-face time and non-face-to-face time, including preparing to see the patient, ordering tests and/or medications, counseling the patient and coordination of care as outlined above.   Ledell Peoples, MD Department of Hematology/Oncology [place] [facility] Phone: [phone] Pager: [phone] Email: [email or web address]  02/24/2021 10:43 AM

## 2021-02-24 NOTE — Telephone Encounter (Signed)
Scheduled per sch msg. Called and left msg  

## 2021-02-25 ENCOUNTER — Ambulatory Visit: Payer: Self-pay

## 2021-02-25 NOTE — Telephone Encounter (Signed)
Message from Celene Kras sent at 02/25/2021 12:05 PM EDT  Pt called stating that he had a boil pop on his elbow x1 week ago. He states that it is still going down and that the bandages are being filled with pus 2-3 times a day. Please advise .     Call History   Type Contact Phone/Fax User  02/25/2021 12:04 PM EDT Phone (Incoming) Md, Gory (Self) [phone] Lemmie Evens) Celene Kras

## 2021-02-25 NOTE — Telephone Encounter (Signed)
Patient returned call and reports boil to left elbow is red and has started draining pus x 1 week and requires changing dressings 2-3 times a day. Denies fever, severe pain . No available appt with PCP until 04/24/21. Please advise if patient needs appt or can get medication to help treat boil. Care advise given. Patient verbalized understanding of care advise and to call back or go to [facility] or ED if symptoms worsen.    Reason for Disposition  Boil overlying a joint  Answer Assessment - Initial Assessment Questions 1. APPEARANCE of BOIL: "What does the boil look like?"      red 2. LOCATION: "Where is the boil located?"      Left elbow  3. NUMBER: "How many boils are there?"      One large boil  4. SIZE: "How big is the boil?" (e.g., inches, cm; compare to size of a coin or other object)     Dime size  5. ONSET: "When did the boil start?"     A while ago but drainage started 1 week ago  6. PAIN: "Is there any pain?" If Yes, ask: "How bad is the pain?"   (Scale 1-10; or mild, moderate, severe)     Mild  7. FEVER: "Do you have a fever?" If Yes, ask: "What is it, how was it measured, and when did it start?"      no 8. SOURCE: "Have you been around anyone with boils or other Staph infections?" "Have you ever had boils before?"     Yes under armpits and groin  9. OTHER SYMPTOMS: "Do you have any other symptoms?" (e.g., shaking chills, weakness, rash elsewhere on body)     No  10. PREGNANCY: "Is there any chance you are pregnant?" "When was your last menstrual period?"       na  Protocols used: Boil (Skin Abscess)-A-AH

## 2021-02-25 NOTE — Telephone Encounter (Signed)
Pt has an appt with Ane[facility]a on 9/15

## 2021-02-26 NOTE — Telephone Encounter (Signed)
Contacted pt to schedule with provider for tomorrow at 950am. Pt states he is unable to come tomorrow only in the afternoon. Pt states he will like to keep appt on 9/15

## 2021-03-05 ENCOUNTER — Ambulatory Visit: Payer: No Typology Code available for payment source | Admitting: Physician Assistant

## 2021-03-06 NOTE — Progress Notes (Signed)
Office Visit Note  Patient: Brad [name]             Date of Birth: Nov 22, 1955           MRN: [id number]             PCP: Ladell Pier, MD Referring: Ladell Pier, MD Visit Date: 03/20/2021 Occupation: _0 @  Subjective:  Gout.   History of Present Illness: Brad [name] is a 65 y.o. male with a history of tophaceous gout.  He has been seen in consultation per request of his PCP.  According to the patient his symptoms a started at least 15 years ago with joint pain and swelling.  He states he used to go to urgent care where he was told that he was gout and was given anti-inflammatories.  Few years later he establish with a new PCP who prescribed colchicine on as needed basis.  He states he used to eat a lot of red meat and drink beer.  He was having flares 2-3 times a month and gradually the flares became less often.  He states in the last year he has had only 2 flares.  He started seeing Dr. [name] about 2 months ago at that time she did blood work and advised dietary modifications.  He has been drinking wine now occasionally and has quit drinking beer.  He is also avoiding red meat.  He has not had a flare in the last 2 months.  He does not recall being on allopurinol or Uloric in the past.  He is not taken colchicine in a long time.  He does not recall any recent prednisone intake.  He was also found to have elevated creatinine and was referred to a nephrologist.  His appointment is pending.  He denies any family history of gout.  Activities of Daily Living:  Patient reports morning stiffness for 5 minutes.   Patient Denies nocturnal pain.  Difficulty dressing/grooming: Denies Difficulty climbing stairs: Denies Difficulty getting out of chair: Denies Difficulty using hands for taps, buttons, cutlery, and/or writing: Denies  Review of Systems  Constitutional:  Negative for fatigue and night sweats.  HENT:  Negative for mouth sores, mouth dryness and nose dryness.    Eyes:  Negative for redness and dryness.  Respiratory:  Negative for shortness of breath and difficulty breathing.   Cardiovascular:  Positive for palpitations. Negative for chest pain, hypertension, irregular heartbeat and swelling in legs/feet.  Gastrointestinal:  Negative for constipation and diarrhea.  Endocrine: Negative for increased urination.  Musculoskeletal:  Negative for joint pain, joint pain, joint swelling, myalgias, muscle weakness, morning stiffness, muscle tenderness and myalgias.  Skin:  Negative for color change, rash, hair loss, nodules/bumps, skin tightness, ulcers and sensitivity to sunlight.  Allergic/Immunologic: Negative for susceptible to infections.  Neurological:  Negative for dizziness, fainting, memory loss, night sweats and weakness ( ).  Hematological:  Negative for swollen glands.  Psychiatric/Behavioral:  Negative for depressed mood and sleep disturbance. The patient is not nervous/anxious.    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 No pertinent surgical history. Social History   Social History Narrative   Not on file    There is no immunization history on file for this patient.   Objective: Vital Signs: BP (!) 156/88 (BP Location: Left Arm, Patient Position: Sitting, Cuff Size: Small)   Pulse 86   Resp 13   Ht 6' (1.829 m)   Wt 153 lb 12.8 oz (69.8 kg)   BMI 20.86 kg/m    Physical Exam Vitals and nursing note reviewed.  Constitutional:      Appearance: He is well-developed.  HENT:      Head: Normocephalic and atraumatic.  Eyes:     Conjunctiva/sclera: Conjunctivae normal.     Pupils: Pupils are equal, round, and reactive to light.  Cardiovascular:     Rate and Rhythm: Normal rate and regular rhythm.     Heart sounds: Normal heart sounds.  Pulmonary:     Effort: Pulmonary effort is normal.     Breath sounds: Normal breath sounds.  Abdominal:     General: Bowel sounds are normal.     Palpations: Abdomen is soft.  Musculoskeletal:     Cervical back: Normal range of motion and neck supple.  Skin:    General: Skin is warm and dry.     Capillary Refill: Capillary refill takes less than 2 seconds.  Neurological:     Mental Status: He is alert and oriented to person, place, and time.  Psychiatric:        Behavior: Behavior normal.     Musculoskeletal Exam: C-spine was in good range of motion.  Shoulder joints and elbow joints with good range of motion.  He had decreased range of motion of bilateral wrist joints MCPs PIPs and DIPs.  He has tophi almost 2 were all of his MCPs and some of the PIPs and DIPs.  Hip joints and knee joints with good range of motion.  He had large tophi over bilateral knee joints.  He had tenderness across MTPs but no swelling was noted.  CDAI Exam: CDAI Score: -- Patient Global: --; Provider Global: -- Swollen: --; Tender: -- Joint Exam 03/20/2021   No joint exam has been documented for this visit   There is currently no information documented on the homunculus. Go to the Rheumatology activity and complete the homunculus joint exam.  Investigation: No additional findings.  Imaging: No results found.  Recent Labs: Lab Results  Component Value Date   WBC 16.7 (H) 02/13/2021   HGB 8.7 (L) 02/13/2021   PLT 288 02/13/2021   NA 132 (L) 02/13/2021   K 3.9 02/13/2021   CL 99 02/13/2021   CO2 19 (L) 02/13/2021   GLUCOSE 114 (H) 02/13/2021   BUN 29 (H) 02/13/2021   CREATININE 2.50 (H) 02/13/2021   BILITOT 0.5 02/13/2021   ALKPHOS  60 02/13/2021   AST 16 02/13/2021   ALT 17 02/13/2021   PROT 6.8 02/13/2021   ALBUMIN 2.9 (L) 02/13/2021   CALCIUM 8.6 (L) 02/13/2021   GFRAA >60 11/25/2016    Speciality Comments: No specialty comments available.  Procedures:  No procedures performed Allergies: Patient has no known allergies.   Assessment / Plan:     Visit Diagnoses: Chronic tophaceous gout -patient gives history of gout for at least 15 years.  He has had recurrent flares for many years.  He states the flares used to be several times a month and  recently they have become less frequent.  He is to drink beer on a regular basis but he switched to wine hand he drinks wine occasionally now.  He is also cut back on the red meat intake.  His last gout flare was at least 2 months ago.  01/27/21: Uric acid 8.7. 02/13/21: ESR 69, CRP 9.3.  Detailed counsel regarding gout was provided.  Low purine diet was discussed.  Abstinence from alcohol was discussed.  I also discussed that the oral medications like allopurinol and Uloric will not be as efficacious in severe tophaceous gout.  I discussed the option of starting on Krystexxa.  I gave him information to review.  He will discuss this further with [name] at the follow-up visit.  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  Plan: XR Hand 2 View Right, XR Hand 2 View Left.  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  Chronic pain of both knees -he had discomfort in his bilateral knee joints and large tophi in bilateral knee joints.  Plan: XR KNEE 3 VIEW RIGHT, XR KNEE 3 VIEW LEFT.  X-ray showed moderate osteoarthritis, moderate chondromalacia patella and tophaceous deposits.  Pain in both feet -he has some discomfort in his feet and tophaceous changes noted on his feet as well.  Plan: XR Foot 2 Views Right, XR Foot 2 Views Left.  X-rays are consistent with osteoarthritis  and tophaceous arthropathy.  High risk medication use -I will place him on colchicine 0.3 mg p.o. daily.  I will hold off allopurinol and Uloric at this point  in anticipation to start him on Krystexxa.  I gave him information on Krystexxa to review.  I will have him follow-up with [name].  Plan: Glucose 6 phosphate dehydrogenase  Essential hypertension-blood pressure is elevated today.  Cardiac arrhythmia, unspecified cardiac arrhythmia type-he is followed by cardiology.  AKI (acute kidney injury) ([facility])-his creatinine was 2.5 on February 13, 2021.  Has an appointment coming up with nephrology.  Vitamin B 12 deficiency  Anemia, chronic disease  Tobacco dependence - 1/2 PPDx 15 yrs. smoking cessation was discussed.  Orders: Orders Placed This Encounter  Procedures   XR Hand 2 View Right   XR Hand 2 View Left   XR Foot 2 Views Right   XR Foot 2 Views Left   XR KNEE 3 VIEW RIGHT   XR KNEE 3 VIEW LEFT   Glucose 6 phosphate dehydrogenase    Meds ordered this encounter  Medications   colchicine 0.6 MG tablet    Sig: 1/2 tablet p.o. daily    Dispense:  15 tablet    Refill:  2      Follow-Up Instructions: Return for Gout.   Bo Merino, MD  Note - This record has been created using Editor, commissioning.  Chart creation errors have been sought, but may not always  have been located. Such creation errors do not reflect on  the standard of medical care.

## 2021-03-20 ENCOUNTER — Ambulatory Visit: Payer: Self-pay

## 2021-03-20 ENCOUNTER — Other Ambulatory Visit: Payer: Self-pay

## 2021-03-20 ENCOUNTER — Ambulatory Visit (INDEPENDENT_AMBULATORY_CARE_PROVIDER_SITE_OTHER): Payer: No Typology Code available for payment source | Admitting: Rheumatology

## 2021-03-20 ENCOUNTER — Encounter: Payer: Self-pay | Admitting: Rheumatology

## 2021-03-20 VITALS — BP 156/88 | HR 86 | Resp 13 | Ht 72.0 in | Wt 153.8 lb

## 2021-03-20 DIAGNOSIS — M25562 Pain in left knee: Secondary | ICD-10-CM

## 2021-03-20 DIAGNOSIS — N179 Acute kidney failure, unspecified: Secondary | ICD-10-CM

## 2021-03-20 DIAGNOSIS — I10 Essential (primary) hypertension: Secondary | ICD-10-CM

## 2021-03-20 DIAGNOSIS — M79672 Pain in left foot: Secondary | ICD-10-CM | POA: Diagnosis not present

## 2021-03-20 DIAGNOSIS — F17200 Nicotine dependence, unspecified, uncomplicated: Secondary | ICD-10-CM

## 2021-03-20 DIAGNOSIS — Z79899 Other long term (current) drug therapy: Secondary | ICD-10-CM

## 2021-03-20 DIAGNOSIS — M79642 Pain in left hand: Secondary | ICD-10-CM | POA: Diagnosis not present

## 2021-03-20 DIAGNOSIS — M79671 Pain in right foot: Secondary | ICD-10-CM

## 2021-03-20 DIAGNOSIS — M25561 Pain in right knee: Secondary | ICD-10-CM

## 2021-03-20 DIAGNOSIS — M79641 Pain in right hand: Secondary | ICD-10-CM

## 2021-03-20 DIAGNOSIS — I499 Cardiac arrhythmia, unspecified: Secondary | ICD-10-CM

## 2021-03-20 DIAGNOSIS — E538 Deficiency of other specified B group vitamins: Secondary | ICD-10-CM

## 2021-03-20 DIAGNOSIS — G8929 Other chronic pain: Secondary | ICD-10-CM | POA: Diagnosis not present

## 2021-03-20 DIAGNOSIS — M1A9XX1 Chronic gout, unspecified, with tophus (tophi): Secondary | ICD-10-CM

## 2021-03-20 DIAGNOSIS — D638 Anemia in other chronic diseases classified elsewhere: Secondary | ICD-10-CM

## 2021-03-20 MED ORDER — COLCHICINE 0.6 MG PO TABS: ORAL_TABLET | ORAL | 2 refills | Status: DC

## 2021-03-20 NOTE — Patient Instructions (Signed)
Pegloticase solution for injection What is this medication? PEGLOTICASE (peg LOE ti kase) is an enzyme used to treat chronic gout. This medicine may be used for other purposes; ask your health care provider or pharmacist if you have questions. COMMON BRAND NAME(S): Krystexxa What should I tell my care team before I take this medication? They need to know if you have any of these conditions: G6PD deficiency heart disease an unusual or allergic reaction to pegloticase, other medicines, foods, dyes, or preservatives pregnant or trying to get pregnant breast-feeding How should I use this medication? This medicine is for infusion into a vein. It is given by a health care professional in a hospital or clinic setting. A special MedGuide will be given to you before each treatment. Be sure to read this information carefully each time. Talk to your pediatrician regarding the use of this medicine in children. Special care may be needed. Overdosage: If you think you have taken too much of this medicine contact a poison control center or emergency room at once. NOTE: This medicine is only for you. Do not share this medicine with others. What if I miss a dose? It is important not to miss your dose. Call your doctor or health care professional if you are unable to keep an appointment. What may interact with this medication? certain medicines for gout like allopurinol, febuxostat, probenecid, sulfinpyrazone This list may not describe all possible interactions. Give your health care provider a list of all the medicines, herbs, non-prescription drugs, or dietary supplements you use. Also tell them if you smoke, drink alcohol, or use illegal drugs. Some items may interact with your medicine. What should I watch for while using this medication? This medicine can cause serious allergic reactions. To reduce your risk you may need to take medicine before treatment with this medicine. Take your medicine as  directed. Your condition will be monitored carefully while you are receiving this medicine. Tell your doctor or healthcare professional if your symptoms do not start to get better or if they get worse. You will need simple blood tests. What side effects may I notice from receiving this medication? Side effects that you should report to your doctor or health care professional as soon as possible: allergic reactions like skin rash, itching or hives, swelling of the face, lips, or tongue bluish coloring of the skin breathing problems chest pain or chest tightness fast or irregular heartbeat feeling faint or lightheaded, falls pain when urinating swelling of the ankles, feet, hands symptoms of gout unusual bleeding or bruising unusually weak or tired Side effects that usually do not require medical attention (report to your doctor or health care professional if they continue or are bothersome): muscle pain nausea, vomiting upset stomach This list may not describe all possible side effects. Call your doctor for medical advice about side effects. You may report side effects to FDA at 1-800-FDA-1088. Where should I keep my medication? This drug is given in a hospital or clinic and will not be stored at home. NOTE: This sheet is a summary. It may not cover all possible information. If you have questions about this medicine, talk to your doctor, pharmacist, or health care provider.  2022 Elsevier/Gold Standard (2015-07-10 11:17:08)

## 2021-03-24 LAB — GLUCOSE 6 PHOSPHATE DEHYDROGENASE: G-6PDH: 17.8 U/g Hgb (ref 7.0–20.5)

## 2021-03-24 NOTE — Progress Notes (Signed)
G6PD is normal.

## 2021-04-02 ENCOUNTER — Encounter: Payer: Self-pay | Admitting: *Deleted

## 2021-04-02 ENCOUNTER — Ambulatory Visit (INDEPENDENT_AMBULATORY_CARE_PROVIDER_SITE_OTHER): Payer: No Typology Code available for payment source | Admitting: Cardiology

## 2021-04-02 ENCOUNTER — Other Ambulatory Visit: Payer: Self-pay

## 2021-04-02 ENCOUNTER — Encounter: Payer: Self-pay | Admitting: Cardiology

## 2021-04-02 DIAGNOSIS — F17200 Nicotine dependence, unspecified, uncomplicated: Secondary | ICD-10-CM | POA: Diagnosis not present

## 2021-04-02 DIAGNOSIS — I10 Essential (primary) hypertension: Secondary | ICD-10-CM

## 2021-04-02 DIAGNOSIS — N184 Chronic kidney disease, stage 4 (severe): Secondary | ICD-10-CM | POA: Diagnosis not present

## 2021-04-02 DIAGNOSIS — D638 Anemia in other chronic diseases classified elsewhere: Secondary | ICD-10-CM

## 2021-04-02 DIAGNOSIS — I4819 Other persistent atrial fibrillation: Secondary | ICD-10-CM | POA: Diagnosis not present

## 2021-04-02 DIAGNOSIS — I480 Paroxysmal atrial fibrillation: Secondary | ICD-10-CM

## 2021-04-02 MED ORDER — APIXABAN 5 MG PO TABS: 5.0000 mg | ORAL_TABLET | Freq: Two times a day (BID) | ORAL | 6 refills | Status: DC

## 2021-04-02 NOTE — Progress Notes (Signed)
Cardiology Office Note:    Date:  04/02/2021   ID:  Brad [name], DOB Nov 10, 1955, MRN [id number]  PCP:  Ladell Pier, MD   [facility] Providers Cardiologist:  None     Referring MD: Ladell Pier, MD   History of Present Illness:    Brad [name] is a 65 y.o. male here for the evaluation of arrhythmia at the request of Dr. [name].  Has osteoarthritis, tophaceous gout, degeneration of hand and wrist joints, anemia with leukocytosis chronic followed by oncology hematology with hemoglobin usually between 8 and 9.,  Chronic kidney disease stage IV with creatinine of 2.3-2.5.  He was last seen in the ED 06/09/2020 with a viral upper respiratory tract infection.  He established care with Dr. [name] 01/27/2021. At that visit he was noted to be in atrial fibrillation. He was started on aspirin and referred to cardiology.  Today: Overall he appears well. He reports having no idea he was in Afib, and generally does not feel palpitations.  Current smoker. He smokes about 1/2 ppd, and is currently trying to quit.  He endorses hypertension that is managed with amlodipine.  Previously he has followed up with oncology regarding his anemia, as well as rheumatology for rheumatoid arthritis.  He denies any chest pain, or shortness of breath. No lightheadedness, headaches, syncope, orthopnea, or PND. Also has no lower extremity edema or exertional symptoms. No bleeding issues.   Past Medical History:  Diagnosis Date   Gout    Hypertension     No past surgical history on file.  Current Medications: Current Meds  Medication Sig   amLODipine (NORVASC) 5 MG tablet Take 1 tablet (5 mg total) by mouth daily.   apixaban (ELIQUIS) 5 MG TABS tablet Take 1 tablet (5 mg total) by mouth 2 (two) times daily.   vitamin B-12 (CYANOCOBALAMIN) 1000 MCG tablet Take 1 tablet (1,000 mcg total) by mouth daily.     Allergies:   Patient has no known allergies.   Social History    Socioeconomic History   Marital status: Single    Spouse name: Not on file   Number of children: Not on file   Years of education: Not on file   Highest education level: Not on file  Occupational History   Not on file  Tobacco Use   Smoking status: Every Day    Packs/day: 0.15    Types: Cigarettes   Smokeless tobacco: Never  Vaping Use   Vaping Use: Never used  Substance and Sexual Activity   Alcohol use: Not Currently    Comment: none x 2 wks   Drug use: No   Sexual activity: Not on file  Other Topics Concern   Not on file  Social History Narrative   Not on file   Social Determinants of Health   Financial Resource Strain: Not on file  Food Insecurity: Not on file  Transportation Needs: Not on file  Physical Activity: Not on file  Stress: Not on file  Social Connections: Not on file     Family History: The patient's family history includes Diabetes in his mother.  ROS:   Please see the history of present illness.    All other systems reviewed and are negative.  EKGs/Labs/Other Studies Reviewed:    The following studies were reviewed today: No prior cardiovascular studies available.   EKG:  EKG is personally reviewed and interpreted. 04/02/2021: SINUS. Rate 63 bpm. 01/27/2021 (Dr. [name]): Atrial fibrillation with rate of 96 and LVH  Recent Labs: 01/27/2021: TSH 4.770 02/13/2021: ALT 17; BUN 29; Creatinine 2.50; Hemoglobin 8.7; Platelet Count 288; Potassium 3.9; Sodium 132   Recent Lipid Panel    Component Value Date/Time   CHOL 94 (L) 01/27/2021 1010   TRIG 65 01/27/2021 1010   HDL 43 01/27/2021 1010   CHOLHDL 2.2 01/27/2021 1010   LDLCALC 37 01/27/2021 1010     Risk Assessment/Calculations:    CHA2DS2-VASc Score = 1   This indicates a 0.6% annual risk of stroke. The patient's score is based upon: CHF History: 0 HTN History: 1 Diabetes History: 0 Stroke History: 0 Vascular Disease History: 0 Age Score: 0 Gender Score: 0           Physical Exam:    VS:  BP 130/60 (BP Location: Left Arm, Patient Position: Sitting, Cuff Size: Normal)   Pulse 93   Ht 6' (1.829 m)   Wt 146 lb (66.2 kg)   SpO2 98%   BMI 19.80 kg/m     Wt Readings from Last 3 Encounters:  04/02/21 146 lb (66.2 kg)  03/20/21 153 lb 12.8 oz (69.8 kg)  02/13/21 152 lb 4.8 oz (69.1 kg)     GEN: Well nourished, well developed in no acute distress HEENT: Normal NECK: No JVD; No carotid bruits LYMPHATICS: No lymphadenopathy CARDIAC: Irregularly irregular normal rate, no murmurs, rubs, gallops RESPIRATORY:  Clear to auscultation without rales, wheezing or rhonchi  ABDOMEN: Soft, non-tender, non-distended MUSCULOSKELETAL:  No edema; wrist and hand deformity noted SKIN: Warm and dry NEUROLOGIC:  Alert and oriented x 3 PSYCHIATRIC:  Normal affect   ASSESSMENT:    1. Persistent atrial fibrillation ([place])   2. Tobacco dependence   3. Essential hypertension   4. Chronic kidney disease, stage IV (severe) ([place])   5. Anemia, chronic disease   6. Paroxysmal atrial fibrillation (HCC)    PLAN:    In order of problems listed above: Paroxysmal atrial fibrillation ([place]) Newly discovered atrial fibrillation.  Thankfully, his EKG today shows sinus rhythm.  This is paroxysmal.  However I am concerned given his close proximity to age 77 that his CHADSVASc will be too soon and that he would warrant anticoagulation.  We will go ahead and start him on Eliquis 5 mg twice a day.  Discussed with our pharmacy team.  His creatinine is 2.5, he weighs 66 kg.  Age is 67.  Continue to monitor his hemoglobin closely.  He has not had any bleeding issues.  Anemia monitored by hematology team.  We will also check an echocardiogram to ensure proper structure and function.  Obviously no need for cardioversion.  Tobacco dependence Continue to encourage tobacco cessation.  Smokes 1/2 pack/day.  Essential hypertension Currently well controlled on amlodipine 5 mg  daily.  Chronic kidney disease, stage IV (severe) (HCC) Trying to avoid NSAID use.  Continue to monitor per primary team.  Anemia, chronic disease Followed closely by hematology oncology.  Has been in the 8 range.  No signs of bleeding.  Leukocytosis also normal for him.  Hematology notes reviewed.    Follow-up: 6 months.  Medication Adjustments/Labs and Tests Ordered: Current medicines are reviewed at length with the patient today.  Concerns regarding medicines are outlined above.  Orders Placed This Encounter  Procedures   EKG 12-Lead   ECHOCARDIOGRAM COMPLETE    Meds ordered this encounter  Medications   apixaban (ELIQUIS) 5 MG TABS tablet    Sig: Take 1 tablet (5 mg total) by mouth 2 (two) times  daily.    Dispense:  60 tablet    Refill:  6    Patient Instructions  Medication Instructions:  Please start Eliquis 5 mg (1) tablet twice a day. Continue all other medications as listed.  *If you need a refill on your cardiac medications before your next appointment, please call your pharmacy*  Lab Work: none If you have labs (blood work) drawn today and your tests are completely normal, you will receive your results only by: [place] (if you have MyChart) OR A paper copy in the mail If you have any lab test that is abnormal or we need to change your treatment, we will call you to review the results.  Testing/Procedures: Your physician has requested that you have an echocardiogram. Echocardiography is a painless test that uses sound waves to create images of your heart. It provides your doctor with information about the size and shape of your heart and how well your heart's chambers and valves are working. This procedure takes approximately one hour. There are no restrictions for this procedure.  Follow-Up: At [facility], you and your health needs are our priority.  As part of our continuing mission to provide you with exceptional heart care, we have created  designated Provider Care Teams.  These Care Teams include your primary Cardiologist (physician) and Advanced Practice Providers (APPs -  Physician Assistants and Nurse Practitioners) who all work together to provide you with the care you need, when you need it.  We recommend signing up for the patient portal called "MyChart".  Sign up information is provided on this After Visit Summary.  MyChart is used to connect with patients for Virtual Visits (Telemedicine).  Patients are able to view lab/test results, encounter notes, upcoming appointments, etc.  Non-urgent messages can be sent to your provider as well.   To learn more about what you can do with MyChart, go to [email or web address].    Your next appointment:   6 month(s)  The format for your next appointment:   In Person  Provider:   Candee Furbish, MD   Thank you for choosing [facility]!!     I,Mathew Stumpf,acting as a scribe for Candee Furbish, MD.,have documented all relevant documentation on the behalf of Candee Furbish, MD,as directed by  Candee Furbish, MD while in the presence of Candee Furbish, MD.  I, Candee Furbish, MD, have reviewed all documentation for this visit. The documentation on 04/02/21 for the exam, diagnosis, procedures, and orders are all accurate and complete.   Signed, Candee Furbish, MD  04/02/2021 10:57 AM    [facility]

## 2021-04-02 NOTE — Patient Instructions (Addendum)
Medication Instructions:  Please start Eliquis 5 mg (1) tablet twice a day. Continue all other medications as listed.  *If you need a refill on your cardiac medications before your next appointment, please call your pharmacy*  Lab Work: none If you have labs (blood work) drawn today and your tests are completely normal, you will receive your results only by: [place] (if you have MyChart) OR A paper copy in the mail If you have any lab test that is abnormal or we need to change your treatment, we will call you to review the results.  Testing/Procedures: Your physician has requested that you have an echocardiogram. Echocardiography is a painless test that uses sound waves to create images of your heart. It provides your doctor with information about the size and shape of your heart and how well your heart's chambers and valves are working. This procedure takes approximately one hour. There are no restrictions for this procedure.  Follow-Up: At [facility], you and your health needs are our priority.  As part of our continuing mission to provide you with exceptional heart care, we have created designated Provider Care Teams.  These Care Teams include your primary Cardiologist (physician) and Advanced Practice Providers (APPs -  Physician Assistants and Nurse Practitioners) who all work together to provide you with the care you need, when you need it.  We recommend signing up for the patient portal called "MyChart".  Sign up information is provided on this After Visit Summary.  MyChart is used to connect with patients for Virtual Visits (Telemedicine).  Patients are able to view lab/test results, encounter notes, upcoming appointments, etc.  Non-urgent messages can be sent to your provider as well.   To learn more about what you can do with MyChart, go to [email or web address].    Your next appointment:   6 month(s)  The format for your next appointment:   In Person  Provider:    Candee Furbish, MD   Thank you for choosing [facility]!!

## 2021-04-02 NOTE — Assessment & Plan Note (Signed)
Trying to avoid NSAID use.  Continue to monitor per primary team.

## 2021-04-02 NOTE — Assessment & Plan Note (Signed)
Currently well controlled on amlodipine 5 mg daily.

## 2021-04-02 NOTE — Assessment & Plan Note (Signed)
Continue to encourage tobacco cessation.  Smokes 1/2 pack/day.

## 2021-04-02 NOTE — Assessment & Plan Note (Addendum)
Newly discovered atrial fibrillation.  Thankfully, his EKG today shows sinus rhythm.  This is paroxysmal.  However I am concerned given his close proximity to age 65 that his CHADSVASc will be too soon and that he would warrant anticoagulation.  We will go ahead and start him on Eliquis 5 mg twice a day.  Discussed with our pharmacy team.  His creatinine is 2.5, he weighs 66 kg.  Age is 36.  Continue to monitor his hemoglobin closely.  He has not had any bleeding issues.  Anemia monitored by hematology team.  We will also check an echocardiogram to ensure proper structure and function.  Obviously no need for cardioversion.

## 2021-04-02 NOTE — Assessment & Plan Note (Signed)
Followed closely by hematology oncology.  Has been in the 8 range.  No signs of bleeding.  Leukocytosis also normal for him.  Hematology notes reviewed.

## 2021-04-07 NOTE — Progress Notes (Signed)
Office Visit Note  Patient: Brad [name]             Date of Birth: 12-14-1955           MRN: [id number]             PCP: Ladell Pier, MD Referring: Ladell Pier, MD Visit Date: 04/08/2021  Subjective:  Follow-up (Doing great)    History of Present Illness: Brad [name] is a 65 y.o. male here for chronic tophaceous gout.  His disease course has been complicated by chronic kidney disease limiting titration of allopurinol to an effective dose and worsening retention of uric acid.  He has extensive disease with deforming and tophaceous inflammation.  Currently doing well he has been mostly diet managed and most recent gout attack under control after steroid treatment.  Commended to transfer from Dr. Estanislado Pandy for consideration of urate lowering therapy with Krystexxa.    Previous HPI Seen by Dr. Estanislado Pandy 03/20/21 -return pt to clinic-  Brad [name] is a 65 y.o. male with a history of tophaceous gout.  He has been seen in consultation per request of his PCP.  According to the patient his symptoms a started at least 15 years ago with joint pain and swelling.  He states he used to go to urgent care where he was told that he was gout and was given anti-inflammatories.  Few years later he establish with a new PCP who prescribed colchicine on as needed basis.  He states he used to eat a lot of red meat and drink beer.  He was having flares 2-3 times a month and gradually the flares became less often.  He states in the last year he has had only 2 flares.  He started seeing Dr. [name] about 2 months ago at that time she did blood work and advised dietary modifications.  He has been drinking wine now occasionally and has quit drinking beer.  He is also avoiding red meat.  He has not had a flare in the last 2 months.  He does not recall being on allopurinol or Uloric in the past.  He is not taken colchicine in a long time.  He does not recall any recent prednisone intake.  He was also  found to have elevated creatinine and was referred to a nephrologist.  His appointment is pending.  He denies any family history of gout.    Activities of Daily Living:  Patient reports morning stiffness for 0  none .   Patient Denies nocturnal pain.  Difficulty dressing/grooming: Reports Difficulty climbing stairs: Denies Difficulty getting out of chair: Denies Difficulty using hands for taps, buttons, cutlery, and/or writing: Reports  Review of Systems  Constitutional:  Positive for fatigue.  HENT:  Negative for mouth dryness.   Eyes:  Negative for dryness.  Respiratory:  Negative for shortness of breath.   Cardiovascular:  Negative for swelling in legs/feet.  Gastrointestinal:  Negative for constipation.  Endocrine: Positive for cold intolerance.  Genitourinary:  Negative for difficulty urinating.  Musculoskeletal:  Negative for joint pain and joint pain.  Skin:  Positive for nodules/bumps.  Allergic/Immunologic: Negative for susceptible to infections.  Neurological:  Negative for numbness.  Hematological:  Negative for bruising/bleeding tendency.  Psychiatric/Behavioral:  Positive for sleep disturbance.     PMFS History:  Patient Active Problem List   Diagnosis Date Noted   Iron deficiency anemia due to chronic blood loss 12/17/2021   Non-pressure chronic ulcer of other part of left foot  limited to breakdown of skin ([place]) 11/27/2021   Leg pain 11/19/2021   Edema of left lower extremity 11/18/2021   Protein-calorie malnutrition, severe 11/18/2021   Thrombocytosis 11/13/2021   Leukocytosis 11/13/2021   Hypertensive kidney disease with stage 4 chronic kidney disease ([place]) 11/13/2021   Steroid-induced hyperglycemia 11/13/2021   Paroxysmal atrial fibrillation ([place]) 04/02/2021   Chronic kidney disease, stage IV (severe) ([place]) 04/02/2021   Vitamin B 12 deficiency 01/31/2021   Chronic tophaceous gout 01/27/2021   Tobacco dependence 01/27/2021   Anemia, chronic disease  01/27/2021   Essential hypertension 11/21/2016    Past Medical History:  Diagnosis Date   Gout    Hypertension    Kidney failure     Family History  Problem Relation Age of Onset   Diabetes Mother    History reviewed. No pertinent surgical history. Social History   Social History Narrative   ** Merged History Encounter **        There is no immunization history on file for this patient.   Objective: Vital Signs: BP (!) 154/73 (BP Location: Left Arm, Patient Position: Sitting, Cuff Size: Normal)   Pulse 80   Resp 15   Ht 6' (1.829 m)   Wt 151 lb (68.5 kg)   BMI 20.48 kg/m    Physical Exam Cardiovascular:     Rate and Rhythm: Normal rate and regular rhythm.  Pulmonary:     Effort: Pulmonary effort is normal.     Breath sounds: Normal breath sounds.  Musculoskeletal:     Right lower leg: No edema.  Skin:    General: Skin is warm and dry.     Findings: No rash.  Neurological:     Mental Status: He is alert.  Psychiatric:        Mood and Affect: Mood normal.      Musculoskeletal Exam:  Neck full ROM no tenderness Shoulders full ROM no tenderness or swelling Elbows full ROM no tenderness or swelling Decreased range of motion in bilateral wrists and throughout the fingers of both hands without palpable synovitis Extensive tophi overlying majority of MCP PIP and DIP finger joints Knees full ROM no tenderness there are tophi present on prepatellar and or suprapatellar bursa Ankles full ROM no tenderness or swelling Positive MTP squeeze tenderness  Investigation: No additional findings.  Imaging: No results found.  Recent Labs: Lab Results  Component Value Date   WBC 10.8 (H) 02/07/2022   HGB 10.7 (L) 02/07/2022   PLT 441 (H) 02/07/2022   NA 132 (L) 02/07/2022   K 3.9 02/07/2022   CL 106 02/07/2022   CO2 16 (L) 02/07/2022   GLUCOSE 195 (H) 02/07/2022   BUN 24 (H) 02/07/2022   CREATININE 2.11 (H) 02/07/2022   BILITOT 0.4 11/30/2021   ALKPHOS 43  11/30/2021   AST 9 (L) 11/30/2021   ALT 8 11/30/2021   PROT 6.3 (L) 11/30/2021   ALBUMIN 3.4 (L) 11/30/2021   CALCIUM 8.7 (L) 02/07/2022   GFRAA >60 11/25/2016    Speciality Comments: No specialty comments available.  Procedures:  No procedures performed Allergies: Patient has no allergy information on record.   Assessment / Plan:     Visit Diagnoses: Chronic tophaceous gout - Plan: DISCONTINUED: allopurinol (ZYLOPRIM) 100 MG tablet  Recommend to start with allopurinol 50 mg for urate lowering therapy we will have to titrate very gradually we will plan to look into getting alternate therapy approved since I think we are unlikely to get control of his  extensive disease in any reasonable timeframe.  The gout flares as well as chronic progressive damage and tophaceous deposits are severely impairing ability to work and other activities.  Chronic kidney disease, stage IV (severe) (HCC)  Advanced chronic kidney disease is the worst factor exacerbating Mr. Empey gouty arthritis.  Uric acid very elevated due to decreased renal elimination.  Allopurinol titration will require very small incremental increases to follow best practice for minimizing risk of hypersensitivity reaction.  [name] would be safe but cannot take methotrexate as accompaniment to help reduce risk of neutralizing antibodies.  Orders: No orders of the defined types were placed in this encounter.   Meds ordered this encounter  Medications   DISCONTD: allopurinol (ZYLOPRIM) 100 MG tablet    Sig: Take 0.5 tablets (50 mg total) by mouth daily.    Dispense:  30 tablet    Refill:  1     Follow-Up Instructions: Return in about 4 weeks (around 05/06/2021) for Gout allopurinol start f/u 4wks.   Collier Salina, MD  Note - This record has been created using [employer].  Chart creation errors have been sought, but may not always  have been located. Such creation errors do not reflect on  the standard of  medical care.

## 2021-04-08 ENCOUNTER — Ambulatory Visit (INDEPENDENT_AMBULATORY_CARE_PROVIDER_SITE_OTHER): Payer: No Typology Code available for payment source | Admitting: Internal Medicine

## 2021-04-08 ENCOUNTER — Encounter: Payer: Self-pay | Admitting: Internal Medicine

## 2021-04-08 ENCOUNTER — Other Ambulatory Visit: Payer: Self-pay

## 2021-04-08 VITALS — BP 154/73 | HR 80 | Resp 15 | Ht 72.0 in | Wt 151.0 lb

## 2021-04-08 DIAGNOSIS — N184 Chronic kidney disease, stage 4 (severe): Secondary | ICD-10-CM | POA: Diagnosis not present

## 2021-04-08 DIAGNOSIS — M1A9XX1 Chronic gout, unspecified, with tophus (tophi): Secondary | ICD-10-CM | POA: Diagnosis not present

## 2021-04-08 MED ORDER — ALLOPURINOL 100 MG PO TABS: 50.0000 mg | ORAL_TABLET | Freq: Every day | ORAL | 1 refills | Status: DC

## 2021-04-08 NOTE — Patient Instructions (Signed)
Allopurinol Tablets What is this medication? ALLOPURINOL (al oh PURE i nole) treats gout or kidney stones. It may also be used to prevent high uric acid levels after chemotherapy. It works by decreasing uric acid levels in your body. This medicine may be used for other purposes; ask your health care provider or pharmacist if you have questions. COMMON BRAND NAME(S): Zyloprim What should I tell my care team before I take this medication? They need to know if you have any of these conditions: Kidney disease Liver disease An unusual or allergic reaction to allopurinol, other medications, foods, dyes, or preservatives Pregnant or trying to get pregnant Breast-feeding How should I use this medication? Take this medication by mouth with a glass of water. Follow the directions on the prescription label. If this medication upsets your stomach, take it with food or milk. Take your doses at regular intervals. Do not take your medication more often than directed. Talk to your care team regarding the use of this medication in children. Special care may be needed. While this medication may be prescribed for children as young as 6 years for selected conditions, precautions do apply. Overdosage: If you think you have taken too much of this medicine contact a poison control center or emergency room at once. NOTE: This medicine is only for you. Do not share this medicine with others. What if I miss a dose? If you miss a dose, take it as soon as you can. If it is almost time for your next dose, take only that dose. Do not take double or extra doses. What may interact with this medication? Do not take this medication with the following: Didanosine, ddI This medication may also interact with the following: Certain antibiotics like amoxicillin, ampicillin Certain medications for cancer Certain medications for immunosuppression like azathioprine, cyclosporine,  mercaptopurine Chlorpropamide Probenecid Sulfinpyrazone Thiazide diuretics, like hydrochlorothiazide Warfarin This list may not describe all possible interactions. Give your health care provider a list of all the medicines, herbs, non-prescription drugs, or dietary supplements you use. Also tell them if you smoke, drink alcohol, or use illegal drugs. Some items may interact with your medicine. What should I watch for while using this medication? Visit your care team for regular checks on your progress. If you are taking this medication to treat gout, you may not have less frequent attacks at first. Keep taking your medication regularly and the attacks should get better within 2 to 6 weeks. Drink plenty of water (10 to 12 full glasses a day) while you are taking this medication. This will help to reduce stomach upset and reduce the risk of getting gout or kidney stones. Call your care team at once if you get a skin rash together with chills, fever, sore throat, or nausea and vomiting, if you have blood in your urine, or difficulty passing urine. This medication may cause serious skin reactions. They can happen weeks to months after starting the medication. Contact your care team right away if you notice fevers or flu-like symptoms with a rash. The rash may be red or purple and then turn into blisters or peeling of the skin. Or, you might notice a red rash with swelling of the face, lips or lymph nodes in your neck or under your arms. Do not take vitamin C without asking your care team. Too much vitamin C can increase the chance of getting kidney stones. You may get drowsy or dizzy. Do not drive, use machinery, or do anything that needs mental alertness   until you know how this medication affects you. Do not stand or sit up quickly, especially if you are an older patient. This reduces the risk of dizzy or fainting spells. Alcohol can make you more drowsy and dizzy. Alcohol can also increase the chance of  stomach problems and increase the amount of uric acid in your blood. Avoid alcoholic drinks. What side effects may I notice from receiving this medication? Side effects that you should report to your care team as soon as possible: Allergic reactions-skin rash, itching, hives, swelling of the face, lips, tongue, or throat Kidney injury-decrease in the amount of urine, swelling of the ankles, hands, or feet Liver injury-right upper belly pain, loss of appetite, nausea, light-colored stool, dark yellow or brown urine, yellowing skin or eyes, unusual weakness or fatigue Rash, fever, and swollen lymph nodes Redness, blistering, peeling, or loosening of the skin, including inside the mouth Side effects that usually do not require medical attention (report to your care team if they continue or are bothersome): Diarrhea Drowsiness Nausea Vomiting This list may not describe all possible side effects. Call your doctor for medical advice about side effects. You may report side effects to FDA at 1-800-FDA-1088. Where should I keep my medication? Keep out of the reach of children and pets. Store at room temperature between 15 and 25 degrees C (59 and 77 degrees F). Protect from light and moisture. Throw away any unused medication after the expiration date. NOTE: This sheet is a summary. It may not cover all possible information. If you have questions about this medicine, talk to your doctor, pharmacist, or health care provider.  2022 Elsevier/Gold Standard (2020-07-01 15:06:06)

## 2021-04-14 ENCOUNTER — Ambulatory Visit: Payer: No Typology Code available for payment source | Admitting: Rheumatology

## 2021-04-16 ENCOUNTER — Ambulatory Visit (HOSPITAL_COMMUNITY): Payer: No Typology Code available for payment source | Attending: Internal Medicine

## 2021-04-16 ENCOUNTER — Other Ambulatory Visit: Payer: Self-pay

## 2021-04-16 DIAGNOSIS — I4819 Other persistent atrial fibrillation: Secondary | ICD-10-CM | POA: Diagnosis present

## 2021-04-16 LAB — ECHOCARDIOGRAM COMPLETE
Area-P 1/2: 2.69 cm2
S' Lateral: 3 cm

## 2021-04-22 ENCOUNTER — Emergency Department (HOSPITAL_COMMUNITY)
Admission: EM | Admit: 2021-04-22 | Discharge: 2021-04-22 | Disposition: A | Discharge status: Home / Self Care | Payer: No Typology Code available for payment source | Attending: Emergency Medicine | Admitting: Emergency Medicine

## 2021-04-22 ENCOUNTER — Encounter (HOSPITAL_COMMUNITY): Payer: Self-pay | Admitting: Emergency Medicine

## 2021-04-22 ENCOUNTER — Emergency Department (HOSPITAL_COMMUNITY): Payer: No Typology Code available for payment source

## 2021-04-22 DIAGNOSIS — Z79899 Other long term (current) drug therapy: Secondary | ICD-10-CM | POA: Diagnosis not present

## 2021-04-22 DIAGNOSIS — Z7901 Long term (current) use of anticoagulants: Secondary | ICD-10-CM | POA: Insufficient documentation

## 2021-04-22 DIAGNOSIS — F17210 Nicotine dependence, cigarettes, uncomplicated: Secondary | ICD-10-CM | POA: Insufficient documentation

## 2021-04-22 DIAGNOSIS — I129 Hypertensive chronic kidney disease with stage 1 through stage 4 chronic kidney disease, or unspecified chronic kidney disease: Secondary | ICD-10-CM | POA: Diagnosis not present

## 2021-04-22 DIAGNOSIS — M109 Gout, unspecified: Secondary | ICD-10-CM | POA: Diagnosis not present

## 2021-04-22 DIAGNOSIS — M25539 Pain in unspecified wrist: Secondary | ICD-10-CM | POA: Diagnosis present

## 2021-04-22 DIAGNOSIS — N184 Chronic kidney disease, stage 4 (severe): Secondary | ICD-10-CM | POA: Insufficient documentation

## 2021-04-22 DIAGNOSIS — I480 Paroxysmal atrial fibrillation: Secondary | ICD-10-CM | POA: Insufficient documentation

## 2021-04-22 MED ORDER — HYDROCODONE-ACETAMINOPHEN 5-325 MG PO TABS: 2.0000 | ORAL_TABLET | ORAL | 0 refills | Status: DC | PRN

## 2021-04-22 MED ORDER — PREDNISONE 10 MG PO TABS: 30.0000 mg | ORAL_TABLET | Freq: Every day | ORAL | 0 refills | Status: DC

## 2021-04-22 NOTE — ED Notes (Signed)
Pt verbalized understanding of d/c instructions, meds and followup care. Denies questions. VSS, no distress noted. Steady gait to exit with all belongings.  ?

## 2021-04-22 NOTE — ED Triage Notes (Addendum)
Patient here for evaluation of swelling and pain in his right hand, seen at rheumatologist recently and diagnosed with gout and prescribed allpurinol but patient here for reevaluation because he does not believe his problem is gout. Patient states he thinks maybe he was bitten by a spider but has not seen a spider nor has noticed a place that he thinks he was bitten.

## 2021-04-22 NOTE — ED Provider Notes (Signed)
[facility] Provider Note   CSN: [id number] Arrival date & time: 04/22/21  1345     History Chief Complaint  Patient presents with   Hand Pain    Kyaire Gruenewald is a 65 y.o. male with a past medical history of stage I chronic kidney disease and gout on allopurinol presenting today with a complaint of wrist pain and a gout flare that began on Monday.  Has stopped drinking beer and instead drinks wine every other day.  Has cut out meat from his diet however this weekend he had a steak which seemed to cause a flare of his gout.  Most painful areas are not the right wrist and bilateral digits 1, 2 and 3.  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     Family History  Problem Relation Age of Onset   Diabetes Mother     Social History   Tobacco Use   Smoking status: Every Day    Packs/day: 0.50    Years: 15.00    Pack years: 7.50    Types: Cigarettes   Smokeless tobacco: Never  Vaping Use   Vaping Use: Never used  Substance Use Topics   Alcohol use: Yes    Alcohol/week: 1.0 standard drink    Types: 1 Glasses of wine per week   Drug use: No    Home Medications Prior to Admission medications   Medication Sig Start Date End Date Taking? Authorizing Provider  allopurinol (ZYLOPRIM) 100 MG tablet Take 0.5 tablets (50 mg total) by mouth daily. 04/08/21   Rice, Resa Miner, MD  amLODipine (NORVASC) 5 MG tablet Take 1 tablet (5 mg total) by mouth daily. 01/27/21   Ladell Pier, MD   apixaban (ELIQUIS) 5 MG TABS tablet Take 1 tablet (5 mg total) by mouth 2 (two) times daily. 04/02/21   Jerline Pain, MD  vitamin B-12 (CYANOCOBALAMIN) 1000 MCG tablet Take 1 tablet (1,000 mcg total) by mouth daily. 02/19/21   Orson Slick, MD    Allergies    Patient has no known allergies.  Review of Systems   Review of Systems  Constitutional:  Negative for chills and fever.  Skin:  Negative for wound.  Neurological:  Negative for dizziness.  All other systems reviewed and are negative.  Physical Exam Updated Vital Signs BP (!) 162/103 (BP Location: Right Arm)   Pulse 88   Temp 98.5 F (36.9 C)   Resp 18   SpO2 100%   Physical Exam Vitals and nursing note reviewed.  Constitutional:      Appearance: Normal appearance.  HENT:     Head: Normocephalic and atraumatic.  Eyes:     General: No scleral icterus.    Conjunctiva/sclera: Conjunctivae normal.  Pulmonary:     Effort: Pulmonary effort is normal. No respiratory distress.  Musculoskeletal:        General: Swelling and tenderness present. Normal range of motion.     Comments: Tenderness to palpation of the right wrist.  Worsen pain with pronation.   Skin:    General: Skin is warm and dry.     Findings: No bruising or rash.  Neurological:     Mental Status: He is alert.  Psychiatric:        Mood and Affect: Mood normal.    ED Results / Procedures / Treatments   Labs (all labs ordered are listed, but only abnormal results are displayed) Labs Reviewed - No data to display  EKG None  Radiology DG Wrist Complete Right  Result Date: 04/22/2021 CLINICAL DATA:  And wrist pain and swelling for 2 days, history of gout EXAM: RIGHT WRIST - COMPLETE 3+ VIEW; RIGHT HAND - COMPLETE 3+ VIEW COMPARISON:  03/20/2009 FINDINGS: No acute fracture or dislocation of the left hand or wrist. Probable small, chronic triquetral avulsion fracture fragment projects over the dorsum of the wrist on lateral view. Extensive soft tissue  edema about the right hand, particularly about the thumb, second metacarpophalangeal joint, and third proximal interphalangeal joint. There may be bony erosion about the thumb interphalangeal and second metacarpophalangeal joints. IMPRESSION: 1. No acute fracture or dislocation of the left hand or wrist. 2. There may be bony erosion about the thumb interphalangeal and second metacarpophalangeal joints, suggestive of gouty arthropathy. 3. Extensive soft tissue edema about the right hand, particularly about the thumb, second metacarpophalangeal joint, and third proximal interphalangeal joint. Electronically Signed   By: Delanna Ahmadi M.D.   On: 04/22/2021 14:49   DG Hand Complete Right  Result Date: 04/22/2021 CLINICAL DATA:  And wrist pain and swelling for 2 days, history of gout EXAM: RIGHT WRIST - COMPLETE 3+ VIEW; RIGHT HAND - COMPLETE 3+ VIEW COMPARISON:  03/20/2009 FINDINGS: No acute fracture or dislocation of the left hand or wrist. Probable small, chronic triquetral avulsion fracture fragment projects over the dorsum of the wrist on lateral view. Extensive soft tissue edema about the right hand, particularly about the thumb, second metacarpophalangeal joint, and third proximal interphalangeal joint. There may be bony erosion about the thumb interphalangeal and second metacarpophalangeal joints. IMPRESSION: 1. No acute fracture or dislocation of the left hand or wrist. 2. There may be bony erosion about the thumb interphalangeal and second metacarpophalangeal joints, suggestive of gouty arthropathy. 3. Extensive soft tissue edema about the right hand, particularly about the thumb, second metacarpophalangeal joint, and third proximal interphalangeal joint. Electronically Signed   By: Delanna Ahmadi M.D.   On: 04/22/2021 14:49    Procedures Procedures   Medications Ordered in ED Medications - No data to display  ED Course  I have reviewed the triage vital signs and the nursing notes.  Pertinent labs  & imaging results that were available during my care of the patient were reviewed by me and considered in my medical decision making (see chart for details).    MDM Rules/Calculators/A&P 65 year old male with a past medical history of chronic kidney disease, and gout presenting today with a gout flareup.  He has been started on steroids and given pain medication and will follow up with his rheumatologist.  There were areas with fluid, however I do not feel like a arthrocentesis is necessary at this time being that the patient is already diagnosed with gout.  I discussed this case with my attending, Dr. Regenia Skeeter.  Patient understanding of the plan and will follow up with his rheumatologist.  Hemodynamically stable, afebrile and ambulatory for discharge.  Final Clinical Impression(s) / ED Diagnoses Final diagnoses:  Acute gout of multiple sites, unspecified  cause    Rx / DC Orders Results and diagnoses were explained to the patient. Return precautions discussed in full. Patient had no additional questions and expressed complete understanding.     Rhae Hammock, PA-C 04/22/21 1617    Sherwood Gambler, MD 04/22/21 1620

## 2021-04-22 NOTE — Discharge Instructions (Addendum)
I am sending two medications to your pharmacy.  The first of which is an opioid pain medication.  It is important that you do not drive on this medication, and keep it out of the reach of others as it is a controlled substance.  The second medication is called prednisone.  This is a steroid and should help you fight through this flare of your gout.  Please continue to take your daily medications and follow-up with your rheumatologist.  It was a pleasure to meet you and I hope that you feel better.

## 2021-04-22 NOTE — ED Provider Notes (Signed)
Emergency Medicine Provider Triage Evaluation Note  Brad [name] , a 65 y.o. male  was evaluated in triage.  Pt complains of pain in right wrist and swelling in right hand.  He reports that the trophi on his hands are unchanged but the whole hand is swollen since last night.  He reports that over the weekend he ate "half a steak."  Review of Systems  Positive: Right wrist pain, Right hand swelling.  Negative: fevers  Physical Exam  BP (!) 162/103 (BP Location: Right Arm)   Pulse 88   Temp 98.5 F (36.9 C)   Resp 18   SpO2 100%  Gen:   Awake, no distress   Resp:  Normal effort  MSK:   Moves extremities without difficulty, obvious deformity (chronic) of both hands, able to move right wrist with mild pain.  No abnormal erythema around right wrist or wounds visualized over right wrist.  Other:  Sensation intact to light touch to right hand.   Medical Decision Making  Medically screening exam initiated at 2:12 PM.  Appropriate orders placed.  Kaedin Hicklin was informed that the remainder of the evaluation will be completed by another provider, this initial triage assessment does not replace that evaluation, and the importance of remaining in the ED until their evaluation is complete.  Will obtain x-rays.  He did    Ollen Gross 04/22/21 1420    Sherwood Gambler, MD 04/22/21 1620

## 2021-04-23 ENCOUNTER — Encounter: Payer: Self-pay | Admitting: *Deleted

## 2021-04-24 ENCOUNTER — Ambulatory Visit (HOSPITAL_COMMUNITY): Admit: 2021-04-24 | Payer: No Typology Code available for payment source | Admitting: Cardiovascular Disease

## 2021-04-24 ENCOUNTER — Encounter (HOSPITAL_COMMUNITY): Payer: Self-pay

## 2021-04-24 SURGERY — CARDIOVERSION
Anesthesia: General

## 2021-04-27 ENCOUNTER — Telehealth: Payer: Self-pay

## 2021-04-27 NOTE — Telephone Encounter (Signed)
I called patient and patient is now scheduled for 04/28/2021 at 1:20pm with [name].

## 2021-04-27 NOTE — Progress Notes (Signed)
Office Visit Note  Patient: Brad [name]             Date of Birth: 07-06-1955           MRN: [id number]             PCP: Ladell Pier, MD Referring: Ladell Pier, MD Visit Date: 04/28/2021   Subjective:  Gout (Right hand and wrist pain and swelling)   History of Present Illness: Brad [name] is a 65 y.o. 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 He is currently taking allopurinol 50 mg PO daily after recent start and was prescribed colchicine 0.3 mg daily dose.  He did not start the colchicine due to excessive cost. At the ED evaluation he was prescribed prednisone 30 mg daily taken for the past 5 days and the tramadol as needed. He has felt partial improvement so far, back to about the starting amount of hand swelling when this episode began. His right wrist is the most painful area.    Review of Systems  Constitutional:  Positive for fatigue.  HENT:  Negative for mouth dryness.   Eyes:  Negative for dryness.  Respiratory:  Negative for shortness of breath.   Cardiovascular:  Negative for swelling in legs/feet.  Gastrointestinal:  Negative for constipation.  Endocrine: Positive for cold intolerance.  Genitourinary:  Negative for difficulty urinating.  Musculoskeletal:  Positive for joint pain, joint pain and joint swelling.  Skin:  Negative for rash.  Allergic/Immunologic: Negative for susceptible to infections.  Neurological:  Negative for numbness.  Hematological:  Negative for bruising/bleeding tendency.  Psychiatric/Behavioral:  Positive for sleep disturbance.    PMFS History:  Patient Active Problem List   Diagnosis Date Noted   Paroxysmal atrial fibrillation ([place]) 04/02/2021   Chronic kidney disease, stage IV (severe) ([place]) 04/02/2021   Vitamin B 12 deficiency 01/31/2021   Chronic tophaceous gout 01/27/2021   Tobacco dependence 01/27/2021   Anemia, chronic  disease 01/27/2021   AKI (acute kidney injury) ([place]) 11/23/2016   Essential hypertension 11/21/2016    Past Medical History:  Diagnosis Date   Gout    Hypertension     Family History  Problem Relation Age of Onset   Diabetes Mother    History reviewed. No pertinent surgical history. Social History   Social History Narrative   Not on file    There is no immunization history on file for this patient.   Objective: Vital Signs: BP (!) 153/73 (BP Location: Left Arm, Patient Position: Sitting, Cuff Size: Normal)   Pulse 98   Resp 16   Ht 6' (1.829 m)   Wt 152 lb (68.9 kg)   BMI 20.61 kg/m    Physical Exam Musculoskeletal:     Right lower leg: No edema.     Left lower leg: No edema.  Skin:    Comments: Hyperpigmentation overlying swollen joints, some desquamation at right 2nd mcp  Neurological:     Mental Status: He is alert.  Psychiatric:        Mood and Affect: Mood normal.     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  Imaging: DG Wrist Complete Right  Result Date: 04/22/2021 CLINICAL DATA:  And wrist pain and swelling for  2 days, history of gout EXAM: RIGHT WRIST - COMPLETE 3+ VIEW; RIGHT HAND - COMPLETE 3+ VIEW COMPARISON:  03/20/2009 FINDINGS: No acute fracture or dislocation of the left hand or wrist. Probable small, chronic triquetral avulsion fracture fragment projects over the dorsum of the wrist on lateral view. Extensive soft tissue edema about the right hand, particularly about the thumb, second metacarpophalangeal joint, and third proximal interphalangeal joint. There may be bony erosion about the thumb interphalangeal and second metacarpophalangeal joints. IMPRESSION: 1. No acute fracture or dislocation of the left hand or wrist. 2. There may be bony erosion about the thumb  interphalangeal and second metacarpophalangeal joints, suggestive of gouty arthropathy. 3. Extensive soft tissue edema about the right hand, particularly about the thumb, second metacarpophalangeal joint, and third proximal interphalangeal joint. Electronically Signed   By: Delanna Ahmadi M.D.   On: 04/22/2021 14:49   DG Hand Complete Right  Result Date: 04/22/2021 CLINICAL DATA:  And wrist pain and swelling for 2 days, history of gout EXAM: RIGHT WRIST - COMPLETE 3+ VIEW; RIGHT HAND - COMPLETE 3+ VIEW COMPARISON:  03/20/2009 FINDINGS: No acute fracture or dislocation of the left hand or wrist. Probable small, chronic triquetral avulsion fracture fragment projects over the dorsum of the wrist on lateral view. Extensive soft tissue edema about the right hand, particularly about the thumb, second metacarpophalangeal joint, and third proximal interphalangeal joint. There may be bony erosion about the thumb interphalangeal and second metacarpophalangeal joints. IMPRESSION: 1. No acute fracture or dislocation of the left hand or wrist. 2. There may be bony erosion about the thumb interphalangeal and second metacarpophalangeal joints, suggestive of gouty arthropathy. 3. Extensive soft tissue edema about the right hand, particularly about the thumb, second metacarpophalangeal joint, and third proximal interphalangeal joint. Electronically Signed   By: Delanna Ahmadi M.D.   On: 04/22/2021 14:49   ECHOCARDIOGRAM COMPLETE  Result Date: 04/16/2021    ECHOCARDIOGRAM REPORT   Patient Name:   Brad [name] Date of Exam: 04/16/2021 Medical Rec #:  [id number]      Height:       72.0 in Accession #:    [id number]     Weight:       151.0 lb Date of Birth:  04-10-1956     BSA:          1.891 m Patient Age:    68 years       BP:           154/73 mmHg Patient Gender: M              HR:           59 bpm. Exam Location:  [place] Procedure: 2D Echo, 3D Echo, Cardiac Doppler, Color Doppler and Strain Analysis Indications:     I48.19 Atrial fibrillation  History:        Patient has no prior history of Echocardiogram examinations.                 Arrythmias:Atrial Fibrillation; Risk Factors:Hypertension and                 Current Smoker. CKD stage 4. Anemia.  Sonographer:    Basilia Jumbo BS, RDCS Referring Phys: [place]  1. Global longitudinal strain is -20.7%. Left ventricular ejection fraction, by estimation, is 65 to 70%. The left ventricle has normal function. The left ventricle has no regional wall motion abnormalities. Left ventricular diastolic parameters were normal.  2. Right ventricular  systolic function is normal. The right ventricular size is normal.  3. The mitral valve is normal in structure. Trivial mitral valve regurgitation.  4. The aortic valve is normal in structure. Aortic valve regurgitation is not visualized.  5. Aortic dilatation noted. There is mild dilatation of the aortic root, measuring 39 mm.  6. The inferior vena cava is normal in size with greater than 50% respiratory variability, suggesting right atrial pressure of 3 mmHg. FINDINGS  Left Ventricle: Global longitudinal strain is -20.7%. Left ventricular ejection fraction, by estimation, is 65 to 70%. The left ventricle has normal function. The left ventricle has no regional wall motion abnormalities. The left ventricular internal cavity size was normal in size. There is no left ventricular hypertrophy. Left ventricular diastolic parameters were normal. Right Ventricle: The right ventricular size is normal. No increase in right ventricular wall thickness. Right ventricular systolic function is normal. Left Atrium: Left atrial size was normal in size. Right Atrium: Right atrial size was normal in size. Pericardium: There is no evidence of pericardial effusion. Mitral Valve: The mitral valve is normal in structure. Trivial mitral valve regurgitation. Tricuspid Valve: The tricuspid valve is normal in structure. Tricuspid valve regurgitation  is trivial. Aortic Valve: The aortic valve is normal in structure. Aortic valve regurgitation is not visualized. Pulmonic Valve: The pulmonic valve was normal in structure. Pulmonic valve regurgitation is not visualized. Aorta: Aortic dilatation noted. There is mild dilatation of the aortic root, measuring 39 mm. Venous: The inferior vena cava is normal in size with greater than 50% respiratory variability, suggesting right atrial pressure of 3 mmHg. IAS/Shunts: No atrial level shunt detected by color flow Doppler.  LEFT VENTRICLE PLAX 2D LVIDd:         4.30 cm   Diastology LVIDs:         3.00 cm   LV e' medial:    11.70 cm/s LV PW:         1.10 cm   LV E/e' medial:  5.2 LV IVS:        1.10 cm   LV e' lateral:   11.30 cm/s LVOT diam:     2.90 cm   LV E/e' lateral: 5.4 LV SV:         149 LV SV Index:   79        2D Longitudinal Strain LVOT Area:     6.61 cm  2D Strain GLS (A2C):   -20.1 %                          2D Strain GLS (A3C):   -17.6 %                          2D Strain GLS (A4C):   -24.4 %                          2D Strain GLS Avg:     -20.7 %                           3D Volume EF:                          3D EF:        56 %  LV EDV:       155 ml                          LV ESV:       69 ml                          LV SV:        86 ml RIGHT VENTRICLE             IVC RV Basal diam:  3.20 cm     IVC diam: 1.40 cm RV S prime:     16.70 cm/s TAPSE (M-mode): 2.6 cm LEFT ATRIUM             Index        RIGHT ATRIUM           Index LA diam:        3.20 cm 1.69 cm/m   RA Pressure: 3.00 mmHg LA Vol (A2C):   28.3 ml 14.97 ml/m  RA Area:     12.90 cm LA Vol (A4C):   25.7 ml 13.59 ml/m  RA Volume:   27.10 ml  14.33 ml/m LA Biplane Vol: 27.2 ml 14.39 ml/m  AORTIC VALVE LVOT Vmax:   111.00 cm/s LVOT Vmean:  74.800 cm/s LVOT VTI:    0.226 m  AORTA Ao Root diam: 3.90 cm Ao Asc diam:  3.80 cm MITRAL VALVE               TRICUSPID VALVE                            Estimated RAP:  3.00 mmHg MV  Decel Time: 282 msec MV E velocity: 60.90 cm/s  SHUNTS MV A velocity: 74.80 cm/s  Systemic VTI:  0.23 m MV E/A ratio:  0.81        Systemic Diam: 2.90 cm Dorris Carnes MD Electronically signed by Dorris Carnes MD Signature Date/Time: 04/16/2021/2:43:28 PM    Final     Recent Labs: Lab Results  Component Value Date   WBC 16.7 (H) 02/13/2021   HGB 8.7 (L) 02/13/2021   PLT 288 02/13/2021   NA 132 (L) 02/13/2021   K 3.9 02/13/2021   CL 99 02/13/2021   CO2 19 (L) 02/13/2021   GLUCOSE 114 (H) 02/13/2021   BUN 29 (H) 02/13/2021   CREATININE 2.50 (H) 02/13/2021   BILITOT 0.5 02/13/2021   ALKPHOS 60 02/13/2021   AST 16 02/13/2021   ALT 17 02/13/2021   PROT 6.8 02/13/2021   ALBUMIN 2.9 (L) 02/13/2021   CALCIUM 8.6 (L) 02/13/2021   GFRAA >60 11/25/2016    Speciality Comments: No specialty comments available.  Procedures:  No procedures performed Allergies: Patient has no known allergies.   Assessment / Plan:     Visit Diagnoses: Chronic tophaceous gout - Plan: predniSONE (DELTASONE) 10 MG tablet, allopurinol (ZYLOPRIM) 100 MG tablet  Significant inflammation in several joints worst in right hand fingers. I recommended systemic over local steroid treatment due to multiple joint involvement, longer taper than from ED visit for 3 weeks total duration, since he has extensive crystal deposits for relapse. Continuing allopurinol he can increase to 1 tablet daily (100 mg) we can recheck serum level in 1 month.  Orders: No orders of the defined types were placed in this encounter.  Meds ordered this encounter  Medications   predniSONE (DELTASONE) 10 MG tablet    Sig: Take 4 tablets (40 mg total) by mouth daily for 4 days, THEN 3 tablets (30 mg total) daily for 4 days, THEN 2 tablets (20 mg total) daily for 4 days, THEN 1 tablet (10 mg total) daily for 4 days.    Dispense:  40 tablet    Refill:  0   allopurinol (ZYLOPRIM) 100 MG tablet    Sig: Take 1 tablet (100 mg total) by mouth daily.       Follow-Up Instructions: Return in about 5 weeks (around 06/02/2021) for Gout allopurinol/GCs f/u 5wks.   Collier Salina, MD  Note - This record has been created using [employer].  Chart creation errors have been sought, but may not always  have been located. Such creation errors do not reflect on  the standard of medical care.

## 2021-04-27 NOTE — Telephone Encounter (Signed)
Patient called stating he went to the ER last week with a gout flair.  Patient states he was prescribed Hydrocodone and told to follow-up with [name].  Patient states he has appointment scheduled for 05/06/21, but only has 2 pills remaining of his pain medication.  Patient states he will call back tomorrow morning if he needs to reschedule to an earlier date.

## 2021-04-28 ENCOUNTER — Ambulatory Visit (INDEPENDENT_AMBULATORY_CARE_PROVIDER_SITE_OTHER): Payer: No Typology Code available for payment source | Admitting: Internal Medicine

## 2021-04-28 ENCOUNTER — Other Ambulatory Visit: Payer: Self-pay

## 2021-04-28 ENCOUNTER — Encounter: Payer: Self-pay | Admitting: Internal Medicine

## 2021-04-28 VITALS — BP 153/73 | HR 98 | Resp 16 | Ht 72.0 in | Wt 152.0 lb

## 2021-04-28 DIAGNOSIS — M1A9XX1 Chronic gout, unspecified, with tophus (tophi): Secondary | ICD-10-CM | POA: Diagnosis not present

## 2021-04-28 MED ORDER — ALLOPURINOL 100 MG PO TABS
100.0000 mg | ORAL_TABLET | Freq: Every day | ORAL | Status: DC
Start: 2021-04-28 — End: 2021-06-02

## 2021-04-28 MED ORDER — PREDNISONE 10 MG PO TABS: ORAL_TABLET | ORAL | 0 refills | Status: AC

## 2021-04-28 NOTE — Patient Instructions (Addendum)
I recommend continuing prednisone treatment longer to clear up this current inflammation. Increase to 40 mg then decrease by 1 step after every 4 days to 30 mg then 20 mg then 10 mg per day.  You can increase the allopurinol to 100 mg (1 whole tablet) per day for the rest of November then increase to 150 mg (1.5 tablets) daily.  We can recheck blood tests for progress with lowering uric acid and medication monitoring at next visit.

## 2021-05-06 ENCOUNTER — Ambulatory Visit: Payer: No Typology Code available for payment source | Admitting: Internal Medicine

## 2021-05-25 ENCOUNTER — Other Ambulatory Visit: Payer: Self-pay

## 2021-05-25 ENCOUNTER — Emergency Department (HOSPITAL_COMMUNITY)
Admission: EM | Admit: 2021-05-25 | Discharge: 2021-05-25 | Disposition: A | Discharge status: Home / Self Care | Payer: No Typology Code available for payment source | Attending: Emergency Medicine | Admitting: Emergency Medicine

## 2021-05-25 ENCOUNTER — Encounter (HOSPITAL_COMMUNITY): Payer: Self-pay | Admitting: Emergency Medicine

## 2021-05-25 DIAGNOSIS — R21 Rash and other nonspecific skin eruption: Secondary | ICD-10-CM | POA: Diagnosis present

## 2021-05-25 DIAGNOSIS — Z79899 Other long term (current) drug therapy: Secondary | ICD-10-CM | POA: Diagnosis not present

## 2021-05-25 DIAGNOSIS — N184 Chronic kidney disease, stage 4 (severe): Secondary | ICD-10-CM | POA: Diagnosis not present

## 2021-05-25 DIAGNOSIS — F17210 Nicotine dependence, cigarettes, uncomplicated: Secondary | ICD-10-CM | POA: Insufficient documentation

## 2021-05-25 DIAGNOSIS — Z7901 Long term (current) use of anticoagulants: Secondary | ICD-10-CM | POA: Diagnosis not present

## 2021-05-25 DIAGNOSIS — I129 Hypertensive chronic kidney disease with stage 1 through stage 4 chronic kidney disease, or unspecified chronic kidney disease: Secondary | ICD-10-CM | POA: Insufficient documentation

## 2021-05-25 DIAGNOSIS — B029 Zoster without complications: Secondary | ICD-10-CM | POA: Insufficient documentation

## 2021-05-25 LAB — CBC WITH DIFFERENTIAL/PLATELET
Abs Immature Granulocytes: 0.03 10*3/uL (ref 0.00–0.07)
Basophils Absolute: 0 10*3/uL (ref 0.0–0.1)
Basophils Relative: 0 %
Eosinophils Absolute: 0.1 10*3/uL (ref 0.0–0.5)
Eosinophils Relative: 2 %
HCT: 30.9 % — ABNORMAL LOW (ref 39.0–52.0)
Hemoglobin: 10 g/dL — ABNORMAL LOW (ref 13.0–17.0)
Immature Granulocytes: 0 %
Lymphocytes Relative: 19 %
Lymphs Abs: 1.3 10*3/uL (ref 0.7–4.0)
MCH: 29.5 pg (ref 26.0–34.0)
MCHC: 32.4 g/dL (ref 30.0–36.0)
MCV: 91.2 fL (ref 80.0–100.0)
Monocytes Absolute: 0.3 10*3/uL (ref 0.1–1.0)
Monocytes Relative: 4 %
Neutro Abs: 5.2 10*3/uL (ref 1.7–7.7)
Neutrophils Relative %: 75 %
Platelets: 328 10*3/uL (ref 150–400)
RBC: 3.39 MIL/uL — ABNORMAL LOW (ref 4.22–5.81)
RDW: 16.8 % — ABNORMAL HIGH (ref 11.5–15.5)
WBC: 7 10*3/uL (ref 4.0–10.5)
nRBC: 0 % (ref 0.0–0.2)

## 2021-05-25 LAB — COMPREHENSIVE METABOLIC PANEL
ALT: 13 U/L (ref 0–44)
AST: 16 U/L (ref 15–41)
Albumin: 3.4 g/dL — ABNORMAL LOW (ref 3.5–5.0)
Alkaline Phosphatase: 44 U/L (ref 38–126)
Anion gap: 8 (ref 5–15)
BUN: 29 mg/dL — ABNORMAL HIGH (ref 8–23)
CO2: 20 mmol/L — ABNORMAL LOW (ref 22–32)
Calcium: 9.1 mg/dL (ref 8.9–10.3)
Chloride: 107 mmol/L (ref 98–111)
Creatinine, Ser: 2.03 mg/dL — ABNORMAL HIGH (ref 0.61–1.24)
GFR, Estimated: 36 mL/min — ABNORMAL LOW (ref >60)
Glucose, Bld: 178 mg/dL — ABNORMAL HIGH (ref 70–99)
Potassium: 3.4 mmol/L — ABNORMAL LOW (ref 3.5–5.1)
Sodium: 135 mmol/L (ref 135–145)
Total Bilirubin: 0.5 mg/dL (ref 0.3–1.2)
Total Protein: 6.5 g/dL (ref 6.5–8.1)

## 2021-05-25 LAB — LIPASE, BLOOD: Lipase: 99 U/L — ABNORMAL HIGH (ref 11–51)

## 2021-05-25 MED ORDER — HYDROCODONE-ACETAMINOPHEN 5-325 MG PO TABS: 1.0000 | ORAL_TABLET | Freq: Four times a day (QID) | ORAL | 0 refills | Status: DC | PRN

## 2021-05-25 MED ORDER — VALACYCLOVIR HCL 1 G PO TABS: 1000.0000 mg | ORAL_TABLET | Freq: Two times a day (BID) | ORAL | 0 refills | Status: AC

## 2021-05-25 NOTE — ED Triage Notes (Signed)
Pt. Stated, ive had this rash on my stomach area and back with a rash since sat and Ive had some stomach issues of hurting . This all started on Saturday.

## 2021-05-25 NOTE — ED Provider Notes (Signed)
Emergency Medicine Provider Triage Evaluation Note  [name] , a 65 y.o. male  was evaluated in triage.  Pt complains of abdominal pain since Saturday.  He is also had some constipation and only had 1 bowel movements and Saturday as well which is abnormal for him.  He has an associated rash that appeared on Saturday overlying where his pain is.  Rash is painful and not itchy.  He has had no fevers.  He is not up-to-date on shingles vaccine..  Review of Systems  Positive: Rash, abdominal pain, constipation Negative:   Physical Exam  BP (!) 152/90 (BP Location: Left Arm)   Pulse (!) 102   Temp 98.9 F (37.2 C) (Oral)   Resp 14   SpO2 99%  Gen:   Awake, no distress   Resp:  Normal effort  MSK:   Moves extremities without difficulty  Other:  Rash on right abdomen in dermatomal distribution extending to back. Ttp overlying rash.  Medical Decision Making  Medically screening exam initiated at 9:53 AM.  Appropriate orders placed.  [name] was informed that the remainder of the evaluation will be completed by another provider, this initial triage assessment does not replace that evaluation, and the importance of remaining in the ED until their evaluation is complete.  Appears to be shingles like rash.    [name] 05/25/21 [id number]    Davonna Belling, MD 05/26/21 1240

## 2021-05-25 NOTE — ED Notes (Signed)
Pt has clusters of red raised bumps from mid line of lower abdomen to right lower abdomen and right lower back. Pt denies the rash itching, but does say it burns.

## 2021-05-25 NOTE — ED Provider Notes (Signed)
[facility] EMERGENCY DEPARTMENT Provider Note   CSN: [id number] Arrival date & time: 05/25/21  7322     History Chief Complaint  Patient presents with   Rash   Abdominal Pain    Brad [name] is a 65 y.o. male presenting for evaluation of painful abdominal rash.  Patient states in the past 2 days he has had a right side painful abdominal rash.  Pain is constant, a sharp and burning pain.  Nothing makes it better.  He has not taken anything for it.  No fevers or chills.  No nausea, vomiting, diarrhea.  Never had a rash similar.  He reports a history of gout and A. fib, no other medical problems.  No recent sick contacts with similar rash.   HPI     Past Medical History:  Diagnosis Date   Gout    Hypertension     Patient Active Problem List   Diagnosis Date Noted   Paroxysmal atrial fibrillation ([place]) 04/02/2021   Chronic kidney disease, stage IV (severe) ([place]) 04/02/2021   Vitamin B 12 deficiency 01/31/2021   Chronic tophaceous gout 01/27/2021   Tobacco dependence 01/27/2021   Anemia, chronic disease 01/27/2021   AKI (acute kidney injury) ([place]) 11/23/2016   Essential hypertension 11/21/2016    History reviewed. No pertinent surgical history.     Family History  Problem Relation Age of Onset   Diabetes Mother     Social History   Tobacco Use   Smoking status: Every Day    Packs/day: 0.24    Years: 15.00    Pack years: 3.60    Types: Cigarettes   Smokeless tobacco: Never  Vaping Use   Vaping Use: Never used  Substance Use Topics   Alcohol use: Yes    Comment: wine cooler 3 days a week   Drug use: No    Home Medications Prior to Admission medications   Medication Sig Start Date End Date Taking? Authorizing Provider  HYDROcodone-acetaminophen (NORCO/VICODIN) 5-325 MG tablet Take 1 tablet by mouth every 6 (six) hours as needed. 05/25/21  Yes Dalilah Curlin, PA-C  valACYclovir (VALTREX) 1000 MG tablet Take 1 tablet (1,000 mg  total) by mouth 2 (two) times daily for 10 days. 05/25/21 06/04/21 Yes Vernella Niznik, PA-C  allopurinol (ZYLOPRIM) 100 MG tablet Take 1 tablet (100 mg total) by mouth daily. 04/28/21   Rice, Resa Miner, MD  amLODipine (NORVASC) 5 MG tablet Take 1 tablet (5 mg total) by mouth daily. 01/27/21   Ladell Pier, MD  apixaban (ELIQUIS) 5 MG TABS tablet Take 1 tablet (5 mg total) by mouth 2 (two) times daily. 04/02/21   Jerline Pain, MD  vitamin B-12 (CYANOCOBALAMIN) 1000 MCG tablet Take 1 tablet (1,000 mcg total) by mouth daily. 02/19/21   Orson Slick, MD    Allergies    Patient has no known allergies.  Review of Systems   Review of Systems  Gastrointestinal:  Positive for abdominal pain.  Skin:  Positive for rash.  All other systems reviewed and are negative.  Physical Exam Updated Vital Signs BP (!) 160/102   Pulse 73   Temp 97.9 F (36.6 C) (Oral)   Resp 18   Ht 6' (1.829 m)   Wt 68.8 kg   SpO2 99%   BMI 20.57 kg/m   Physical Exam Vitals and nursing note reviewed.  Constitutional:      General: He is not in acute distress.    Appearance: Normal appearance.  Comments: Resting in the bed in no acute distress  HENT:     Head: Normocephalic and atraumatic.  Eyes:     Conjunctiva/sclera: Conjunctivae normal.     Pupils: Pupils are equal, round, and reactive to light.  Cardiovascular:     Rate and Rhythm: Normal rate and regular rhythm.     Pulses: Normal pulses.  Pulmonary:     Effort: Pulmonary effort is normal. No respiratory distress.     Breath sounds: Normal breath sounds. No wheezing.     Comments: Speaking in full sentences.  Clear lung sounds in all fields. Abdominal:     General: There is no distension.     Palpations: Abdomen is soft. There is no mass.     Tenderness: There is no abdominal tenderness. There is no guarding or rebound.     Comments: No TTP of the abdomen.  There is a vesicular linear rash on the right side of the abdomen starting at  midline and extending to the mid back.  No surrounding skin induration, purulent drainage, or signs of superimposed infection.  Musculoskeletal:        General: Normal range of motion.     Cervical back: Normal range of motion and neck supple.  Skin:    General: Skin is warm and dry.     Capillary Refill: Capillary refill takes less than 2 seconds.     Findings: Rash present.  Neurological:     Mental Status: He is alert and oriented to person, place, and time.  Psychiatric:        Mood and Affect: Mood and affect normal.        Speech: Speech normal.        Behavior: Behavior normal.    ED Results / Procedures / Treatments   Labs (all labs ordered are listed, but only abnormal results are displayed) Labs Reviewed  CBC WITH DIFFERENTIAL/PLATELET - Abnormal; Notable for the following components:      Result Value   RBC 3.39 (*)    Hemoglobin 10.0 (*)    HCT 30.9 (*)    RDW 16.8 (*)    All other components within normal limits  COMPREHENSIVE METABOLIC PANEL - Abnormal; Notable for the following components:   Potassium 3.4 (*)    CO2 20 (*)    Glucose, Bld 178 (*)    BUN 29 (*)    Creatinine, Ser 2.03 (*)    Albumin 3.4 (*)    GFR, Estimated 36 (*)    All other components within normal limits  LIPASE, BLOOD - Abnormal; Notable for the following components:   Lipase 99 (*)    All other components within normal limits  URINALYSIS, ROUTINE W REFLEX MICROSCOPIC    EKG None  Radiology No results found.  Procedures Procedures   Medications Ordered in ED Medications - No data to display  ED Course  I have reviewed the triage vital signs and the nursing notes.  Pertinent labs & imaging results that were available during my care of the patient were reviewed by me and considered in my medical decision making (see chart for details).    MDM Rules/Calculators/A&P                           Patient presenting for evaluation of painful abdominal rash.  On exam, patient  appears nontoxic.  Rash is consistent with shingles.  There does not appear to be a superimposed bacterial  infection.  Labs obtained from triage interpreted by me, overall reassuring.  Stable CKD.  No leukocytosis.  Lipase is minimally elevated at 99, however without nausea, vomiting, or epigastric abdominal pain, doubt acute pancreatitis and I do not feel he needs an emergent CT scan.  Discussed findings of shingles rash with patient.  Discussed treatment with antiviral and pain control.  Encourage follow-up with PCP.  Discussed that rash is contagious until lesions have dried and crusted over.  At this time, patient appears safe for discharge.  Return precautions given.  Patient states he understands and agrees to plan.  Final Clinical Impression(s) / ED Diagnoses Final diagnoses:  Herpes zoster without complication    Rx / DC Orders ED Discharge Orders          Ordered    valACYclovir (VALTREX) 1000 MG tablet  2 times daily        05/25/21 1420    HYDROcodone-acetaminophen (NORCO/VICODIN) 5-325 MG tablet  Every 6 hours PRN        05/25/21 1420             Deeksha Cotrell, PA-C 05/25/21 1436    Horton, Alvin Critchley, DO 05/25/21 1446

## 2021-05-25 NOTE — Discharge Instructions (Signed)
You have the shingles virus.  This is contagious until the rash has become crusty and dried. Take the antiviral medication twice a day for the next 10 days.  Take even if symptoms completely resolved. Use Tylenol as needed for mild to moderate pain.  Use Norco as needed for severe breakthrough pain.  Have caution, this make you tired or groggy.  Not drive or operate machinery while taking this medicine. Follow-up with your primary care doctor for recheck of your symptoms. Return to the emergency room if develop high fever, pus draining from the area, or any new, worsening, or concerning symptoms.

## 2021-06-01 NOTE — Progress Notes (Signed)
Office Visit Note  Patient: Brad [name]             Date of Birth: Aug 22, 1955           MRN: [id number]             PCP: Ladell Pier, MD Referring: Ladell Pier, MD Visit Date: 06/02/2021   Subjective:  Follow-up (Doing good)    History of Present Illness: Kerri Asche is a 65 y.o. 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 Still has mild pain and stiffness. He has not noticed any problem with allopurinol increase.  Previous HPI 04/28/21 Raygen Dahm is a 64 y.o. 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 He is currently taking allopurinol 50 mg PO daily after recent start and was prescribed colchicine 0.3 mg daily dose.  He did not start the colchicine due to excessive cost. At the ED evaluation he was prescribed prednisone 30 mg daily taken for the past 5 days and the tramadol as needed. He has felt partial improvement so far, back to about the starting amount of hand swelling when this episode began. His right wrist is the most painful area.   Review of Systems  Constitutional:  Negative for fatigue.  HENT:  Negative for mouth dryness.   Eyes:  Negative for dryness.  Respiratory:  Negative for shortness of breath.   Cardiovascular:  Negative for swelling in legs/feet.  Gastrointestinal:  Negative for constipation.  Endocrine: Positive for cold intolerance and excessive thirst.  Genitourinary:  Negative for difficulty urinating.  Musculoskeletal:  Negative for joint pain and joint pain.  Skin:  Negative for rash.  Allergic/Immunologic: Negative for susceptible to infections.  Neurological:  Negative for numbness.  Hematological:  Negative for bruising/bleeding tendency.   Psychiatric/Behavioral:  Negative for sleep disturbance.    PMFS History:  Patient Active Problem List   Diagnosis Date Noted   Paroxysmal atrial fibrillation ([place]) 04/02/2021   Chronic kidney disease, stage IV (severe) ([place]) 04/02/2021   Vitamin B 12 deficiency 01/31/2021   Chronic tophaceous gout 01/27/2021   Tobacco dependence 01/27/2021   Anemia, chronic disease 01/27/2021   Essential hypertension 11/21/2016    Past Medical History:  Diagnosis Date   Gout    Hypertension     Family History  Problem Relation Age of Onset   Diabetes Mother    History reviewed. No pertinent surgical history. Social History   Social History Narrative   Not on file    There is no immunization history on file for this patient.   Objective: Vital Signs: BP (!) 142/75 (BP Location: Left Arm, Patient Position: Sitting, Cuff Size: Normal)   Pulse 85   Resp 15   Ht 6' (1.829 m)   Wt 157 lb (71.2 kg)   BMI 21.29 kg/m    Physical Exam Cardiovascular:     Rate and Rhythm: Normal rate and regular rhythm.  Pulmonary:     Effort: Pulmonary effort is normal.     Breath sounds: Normal breath sounds.  Musculoskeletal:     Right lower leg: No edema.     Left lower leg: No edema.  Neurological:     Mental Status: He is alert.  Psychiatric:        Mood and Affect: Mood normal.     Musculoskeletal Exam:  Shoulders and elbows full ROM Decreased ROM in bilateral wrists, MCPs, PIPs, and DIPs Tophi in bilateral knees decreased ROM no palpable effusions MTP squeeze tenderness, no swelling   Investigation: No additional findings.  Imaging: No results found.  Recent Labs: Lab Results  Component Value Date   WBC 8.2 06/04/2021   HGB 10.2 (L) 06/04/2021   PLT 470 (H) 06/04/2021   NA 139 06/04/2021   K 4.3 06/04/2021   CL 108 06/04/2021   CO2 21 (L) 06/04/2021   GLUCOSE 125 (H) 06/04/2021   BUN 20 06/04/2021   CREATININE 2.12 (H) 06/04/2021   BILITOT 0.6 06/04/2021   ALKPHOS 53  06/04/2021   AST 16 06/04/2021   ALT 9 06/04/2021   PROT 7.5 06/04/2021   ALBUMIN 3.7 06/04/2021   CALCIUM 8.8 (L) 06/04/2021   GFRAA >60 11/25/2016    Speciality Comments: No specialty comments available.  Procedures:  No procedures performed Allergies: Patient has no known allergies.   Assessment / Plan:     Visit Diagnoses: Chronic tophaceous gout - Plan: allopurinol (ZYLOPRIM) 100 MG tablet, Uric acid  Tolerating allopurinol but slow titration required, no new severe flares but still has daily chronic symptoms and deforming disease with large tophaceous deposits despite months of treatment. I believe he would benefit with krystexxa infusion treatment to decrease tophaceous deposits, erosive disease activity. He has decreased function in hands especially from this. Also to reduce need for symptomatic treatment which is higher risk with his renal disease and anticoagulation.  Chronic kidney disease, stage IV (severe) (HCC)  Appears to be stable, no recent lab changes no new pedal edema.  Orders: Orders Placed This Encounter  Procedures   Uric acid    Meds ordered this encounter  Medications   allopurinol (ZYLOPRIM) 100 MG tablet    Sig: Take 1 tablet (100 mg total) by mouth daily.    Dispense:  30 tablet    Refill:  1      Follow-Up Instructions: No follow-ups on file.   Collier Salina, MD  Note - This record has been created using [employer].  Chart creation errors have been sought, but may not always  have been located. Such creation errors do not reflect on  the standard of medical care.

## 2021-06-02 ENCOUNTER — Ambulatory Visit (INDEPENDENT_AMBULATORY_CARE_PROVIDER_SITE_OTHER): Payer: No Typology Code available for payment source | Admitting: Internal Medicine

## 2021-06-02 ENCOUNTER — Encounter: Payer: Self-pay | Admitting: Internal Medicine

## 2021-06-02 ENCOUNTER — Other Ambulatory Visit: Payer: Self-pay

## 2021-06-02 VITALS — BP 142/75 | HR 85 | Resp 15 | Ht 72.0 in | Wt 157.0 lb

## 2021-06-02 DIAGNOSIS — M1A9XX1 Chronic gout, unspecified, with tophus (tophi): Secondary | ICD-10-CM

## 2021-06-02 DIAGNOSIS — N184 Chronic kidney disease, stage 4 (severe): Secondary | ICD-10-CM | POA: Diagnosis not present

## 2021-06-02 MED ORDER — ALLOPURINOL 100 MG PO TABS
100.0000 mg | ORAL_TABLET | Freq: Every day | ORAL | 1 refills | Status: DC
Start: 2021-06-02 — End: 2021-07-31

## 2021-06-03 LAB — URIC ACID: Uric Acid, Serum: 8.9 mg/dL — ABNORMAL HIGH (ref 4.0–8.0)

## 2021-06-04 ENCOUNTER — Other Ambulatory Visit: Payer: Self-pay

## 2021-06-04 ENCOUNTER — Inpatient Hospital Stay: Payer: No Typology Code available for payment source

## 2021-06-04 ENCOUNTER — Other Ambulatory Visit: Payer: Self-pay | Admitting: Hematology and Oncology

## 2021-06-04 ENCOUNTER — Inpatient Hospital Stay
Payer: No Typology Code available for payment source | Attending: Hematology and Oncology | Admitting: Hematology and Oncology

## 2021-06-04 VITALS — BP 150/82 | HR 77 | Temp 97.2°F | Resp 17 | Wt 155.4 lb

## 2021-06-04 DIAGNOSIS — D72825 Bandemia: Secondary | ICD-10-CM

## 2021-06-04 DIAGNOSIS — I10 Essential (primary) hypertension: Secondary | ICD-10-CM | POA: Diagnosis not present

## 2021-06-04 DIAGNOSIS — D649 Anemia, unspecified: Secondary | ICD-10-CM | POA: Diagnosis not present

## 2021-06-04 DIAGNOSIS — M109 Gout, unspecified: Secondary | ICD-10-CM | POA: Insufficient documentation

## 2021-06-04 DIAGNOSIS — D75839 Thrombocytosis, unspecified: Secondary | ICD-10-CM | POA: Insufficient documentation

## 2021-06-04 DIAGNOSIS — D72829 Elevated white blood cell count, unspecified: Secondary | ICD-10-CM | POA: Insufficient documentation

## 2021-06-04 DIAGNOSIS — Z7901 Long term (current) use of anticoagulants: Secondary | ICD-10-CM | POA: Insufficient documentation

## 2021-06-04 DIAGNOSIS — F17210 Nicotine dependence, cigarettes, uncomplicated: Secondary | ICD-10-CM | POA: Insufficient documentation

## 2021-06-04 DIAGNOSIS — D638 Anemia in other chronic diseases classified elsewhere: Secondary | ICD-10-CM | POA: Diagnosis not present

## 2021-06-04 DIAGNOSIS — M1A0411 Idiopathic chronic gout, right hand, with tophus (tophi): Secondary | ICD-10-CM

## 2021-06-04 LAB — CBC WITH DIFFERENTIAL (CANCER CENTER ONLY)
Abs Immature Granulocytes: 0.03 10*3/uL (ref 0.00–0.07)
Basophils Absolute: 0.1 10*3/uL (ref 0.0–0.1)
Basophils Relative: 1 %
Eosinophils Absolute: 0.2 10*3/uL (ref 0.0–0.5)
Eosinophils Relative: 2 %
HCT: 29.1 % — ABNORMAL LOW (ref 39.0–52.0)
Hemoglobin: 10.2 g/dL — ABNORMAL LOW (ref 13.0–17.0)
Immature Granulocytes: 0 %
Lymphocytes Relative: 31 %
Lymphs Abs: 2.5 10*3/uL (ref 0.7–4.0)
MCH: 30.3 pg (ref 26.0–34.0)
MCHC: 35.1 g/dL (ref 30.0–36.0)
MCV: 86.4 fL (ref 80.0–100.0)
Monocytes Absolute: 0.5 10*3/uL (ref 0.1–1.0)
Monocytes Relative: 6 %
Neutro Abs: 5 10*3/uL (ref 1.7–7.7)
Neutrophils Relative %: 60 %
Platelet Count: 470 10*3/uL — ABNORMAL HIGH (ref 150–400)
RBC: 3.37 MIL/uL — ABNORMAL LOW (ref 4.22–5.81)
RDW: 16.7 % — ABNORMAL HIGH (ref 11.5–15.5)
WBC Count: 8.2 10*3/uL (ref 4.0–10.5)
nRBC: 0 % (ref 0.0–0.2)

## 2021-06-04 LAB — CMP (CANCER CENTER ONLY)
ALT: 9 U/L (ref 0–44)
AST: 16 U/L (ref 15–41)
Albumin: 3.7 g/dL (ref 3.5–5.0)
Alkaline Phosphatase: 53 U/L (ref 38–126)
Anion gap: 10 (ref 5–15)
BUN: 20 mg/dL (ref 8–23)
CO2: 21 mmol/L — ABNORMAL LOW (ref 22–32)
Calcium: 8.8 mg/dL — ABNORMAL LOW (ref 8.9–10.3)
Chloride: 108 mmol/L (ref 98–111)
Creatinine: 2.12 mg/dL — ABNORMAL HIGH (ref 0.61–1.24)
GFR, Estimated: 34 mL/min — ABNORMAL LOW (ref >60)
Glucose, Bld: 125 mg/dL — ABNORMAL HIGH (ref 70–99)
Potassium: 4.3 mmol/L (ref 3.5–5.1)
Sodium: 139 mmol/L (ref 135–145)
Total Bilirubin: 0.6 mg/dL (ref 0.3–1.2)
Total Protein: 7.5 g/dL (ref 6.5–8.1)

## 2021-06-04 LAB — IRON AND TIBC
Iron: 107 ug/dL (ref 42–163)
Saturation Ratios: 37 % (ref 20–55)
TIBC: 291 ug/dL (ref 202–409)
UIBC: 184 ug/dL (ref 117–376)

## 2021-06-04 LAB — FERRITIN: Ferritin: 159 ng/mL (ref 24–336)

## 2021-06-04 NOTE — Progress Notes (Signed)
[place] [phone]   [phone]  PROGRESS NOTE  Patient Care Team: Ladell Pier, MD as PCP - General (Internal Medicine)  Hematological/Oncological History # Leukocytosis/Thrombocytosis # Normocytic Anemia 11/25/2016: WBC 8.6, Hgb 11.5, MCV 91.3, Plt 532 01/27/2021: WBC 13.7, Hgb 8.1, MCV 88, Plt 707 02/13/2021: establish care with [name] 06/04/2021: WBC 8.2, Hgb 10.2, MCV 86.4, Plt 470  Interval History:  Brad [name] 65 y.o. male with medical history significant for leukocytosis/thrombocytosis and normocytic anemia who presents for a follow up visit. The patient's last visit was on 02/13/2021. In the interim since the last visit he has established care with rheumatology.   On exam today Brad [name] reports he has been feeling considerably better in the interim since her last visit.  He is established care with rheumatology and has begun taking allopurinol.  He has noticed considerable decrease in the size of the tophaceous gout lesions on his hands bilaterally.  He notes a marked decrease in pain and discomfort.  He notes he is not having any issues with bleeding, bruising, or dark stools.  He denies any fevers, chills, sweats, nausea, vomiting or diarrhea.  He is not having any other new or concerning symptoms.  A full 10 point ROS is listed below.  MEDICAL HISTORY:  Past Medical History:  Diagnosis Date   Gout    Hypertension     SURGICAL HISTORY: No past surgical history on file.  SOCIAL HISTORY: Social History   Socioeconomic History   Marital status: Single    Spouse name: Not on file   Number of children: Not on file   Years of education: Not on file   Highest education level: Not on file  Occupational History   Not on file  Tobacco Use   Smoking status: Every Day    Packs/day: 0.24    Years: 15.00    Pack years: 3.60    Types: Cigarettes   Smokeless tobacco: Never  Vaping Use   Vaping Use: Never used   Substance and Sexual Activity   Alcohol use: Yes    Alcohol/week: 3.0 standard drinks    Types: 3 Standard drinks or equivalent per week    Comment: wine cooler 3 days a week   Drug use: No   Sexual activity: Not on file  Other Topics Concern   Not on file  Social History Narrative   Not on file   Social Determinants of Health   Financial Resource Strain: Not on file  Food Insecurity: Not on file  Transportation Needs: Not on file  Physical Activity: Not on file  Stress: Not on file  Social Connections: Not on file  Intimate Partner Violence: Not on file    FAMILY HISTORY: Family History  Problem Relation Age of Onset   Diabetes Mother     ALLERGIES:  has No Known Allergies.  MEDICATIONS:  Current Outpatient Medications  Medication Sig Dispense Refill   allopurinol (ZYLOPRIM) 100 MG tablet Take 1 tablet (100 mg total) by mouth daily. 30 tablet 1   amLODipine (NORVASC) 5 MG tablet Take 1 tablet (5 mg total) by mouth daily. 90 tablet 3   apixaban (ELIQUIS) 5 MG TABS tablet Take 1 tablet (5 mg total) by mouth 2 (two) times daily. 60 tablet 6   HYDROcodone-acetaminophen (NORCO/VICODIN) 5-325 MG tablet Take 1 tablet by mouth every 6 (six) hours as needed. (Patient not taking: Reported on 06/02/2021) 8 tablet 0   LISINOPRIL PO Lisinopril  valACYclovir (VALTREX) 1000 MG tablet Take 1 tablet (1,000 mg total) by mouth 2 (two) times daily for 10 days. 20 tablet 0   vitamin B-12 (CYANOCOBALAMIN) 1000 MCG tablet Take 1 tablet (1,000 mcg total) by mouth daily. 30 tablet 3   No current facility-administered medications for this visit.    REVIEW OF SYSTEMS:   Constitutional: ( - ) fevers, ( - )  chills , ( - ) night sweats Eyes: ( - ) blurriness of vision, ( - ) double vision, ( - ) watery eyes Ears, nose, mouth, throat, and face: ( - ) mucositis, ( - ) sore throat Respiratory: ( - ) cough, ( - ) dyspnea, ( - ) wheezes Cardiovascular: ( - ) palpitation, ( - ) chest discomfort,  ( - ) lower extremity swelling Gastrointestinal:  ( - ) nausea, ( - ) heartburn, ( - ) change in bowel habits Skin: ( - ) abnormal skin rashes Lymphatics: ( - ) new lymphadenopathy, ( - ) easy bruising Neurological: ( - ) numbness, ( - ) tingling, ( - ) new weaknesses Behavioral/Psych: ( - ) mood change, ( - ) new changes  All other systems were reviewed with the patient and are negative.  PHYSICAL EXAMINATION:  Vitals:   06/04/21 1359  BP: (!) 150/82  Pulse: 77  Resp: 17  Temp: (!) 97.2 F (36.2 C)  SpO2: 100%   Filed Weights   06/04/21 1359  Weight: 155 lb 6.4 oz (70.5 kg)    GENERAL: Well-appearing middle-aged African-American male, alert, no distress and comfortable SKIN: skin color, texture, turgor are normal, no rashes or significant lesions EYES: conjunctiva are pink and non-injected, sclera clear LUNGS: clear to auscultation and percussion with normal breathing effort HEART: regular rate & rhythm and no murmurs and no lower extremity edema Musculoskeletal: Marked tophaceous gout of digits bilaterally, improved from prior.  No cyanosis of digits and no clubbing  PSYCH: alert & oriented x 3, fluent speech NEURO: no focal motor/sensory deficits  LABORATORY DATA:  I have reviewed the data as listed CBC Latest Ref Rng & Units 06/04/2021 05/25/2021 02/13/2021  WBC 4.0 - 10.5 K/uL 8.2 7.0 16.7(H)  Hemoglobin 13.0 - 17.0 g/dL 10.2(L) 10.0(L) 8.7(L)  Hematocrit 39.0 - 52.0 % 29.1(L) 30.9(L) 25.0(L)  Platelets 150 - 400 K/uL 470(H) 328 288    CMP Latest Ref Rng & Units 06/04/2021 05/25/2021 02/13/2021  Glucose 70 - 99 mg/dL 125(H) 178(H) 114(H)  BUN 8 - 23 mg/dL 20 29(H) 29(H)  Creatinine 0.61 - 1.24 mg/dL 2.12(H) 2.03(H) 2.50(H)  Sodium 135 - 145 mmol/L 139 135 132(L)  Potassium 3.5 - 5.1 mmol/L 4.3 3.4(L) 3.9  Chloride 98 - 111 mmol/L 108 107 99  CO2 22 - 32 mmol/L 21(L) 20(L) 19(L)  Calcium 8.9 - 10.3 mg/dL 8.8(L) 9.1 8.6(L)  Total Protein 6.5 - 8.1 g/dL 7.5 6.5 6.8   Total Bilirubin 0.3 - 1.2 mg/dL 0.6 0.5 0.5  Alkaline Phos 38 - 126 U/L 53 44 60  AST 15 - 41 U/L 16 16 16   ALT 0 - 44 U/L 9 13 17     RADIOGRAPHIC STUDIES: No results found.  ASSESSMENT & PLAN Brad [name] 65 y.o. male with medical history significant for leukocytosis/thrombocytosis and normocytic anemia who presents for a follow up visit.  After review of the labs, review of the records, and discussion with the patient the patients findings are most consistent with leukocytosis, thrombocytosis, and anemia secondary to inflammation.  The patient has very poorly  controlled gout of the hands with marked swelling and deformity of his fingers.  Fortunately he does have upcoming appointment with rheumatology in order to address this further.  We will rule out other possible causes by checking for nutritional deficiency and assessing his erythropoietin levels.  No indication for an MPN work-up at this time as there is a clear underlying inflammatory cause for his current findings.   # Leukocytosis/Thrombocytosis, resolving # Normocytic Anemia, improving -- blood counts appear to be normalizing with treatment of gout.  --strongly suspect findings were due to gout/inflammation. Trending in appropriate direction with treatment.  --No clear indication for an MPN workup at this time as there is a clear causal explanation for the patient's blood findings --Return as needed. Please re-refer for any new or worsening hematological abnormalities.   No orders of the defined types were placed in this encounter.   All questions were answered. The patient knows to call the clinic with any problems, questions or concerns.  A total of more than 30 minutes were spent on this encounter with face-to-face time and non-face-to-face time, including preparing to see the patient, ordering tests and/or medications, counseling the patient and coordination of care as outlined above.   Ledell Peoples, MD Department  of Hematology/Oncology [place] [facility] Phone: [phone] Pager: [phone] Email: [email or web address]  06/04/2021 3:56 PM

## 2021-06-08 NOTE — Progress Notes (Signed)
Uric acid remains high at 8.9 above goal of 6. He can increase allopurinol dose to taking 1 whole tablet (100 mg) daily. We will look into getting approval for the krystexxa infusion treatment next month. Until he hears otherwise though he should continue the allopurinol.

## 2021-06-11 ENCOUNTER — Telehealth: Payer: Self-pay | Admitting: Pharmacist

## 2021-06-11 NOTE — Telephone Encounter (Addendum)
Patient returned call. We reviewed Brad [name] monitoring for first infusion (2 hours) for anaphylaxis. Reviewed 2 hour infusion time per infusion.  Reviewed uric acid lab that has to be drawn within 48 hours prior to infusion. Patient states he would prefer to come to clinic to have labs drawn.  He states he is enrolling into new insurance plan through employer active 06/21/21 (will not be Medicare)  Per [facility], Brad [name] is non-formulary.  Reached out to Rph at [facility] (patient may be able to receive here) or will have to receive at [facility] (they require labs be forward to them prior to each infusion so would rquire more care coordination if this is the only option)  Knox Saliva, PharmD, MPH, BCPS Clinical Pharmacist (Rheumatology and Pulmonology)

## 2021-06-11 NOTE — Telephone Encounter (Addendum)
ATC patient to review - unable to reach.  Knox Saliva, PharmD, MPH, BCPS Clinical Pharmacist (Rheumatology and Pulmonology)  ----- Message from Collier Salina, MD sent at 06/08/2021  8:36 AM EST ----- Regarding: Krystexxa Mr. Butterfield would benefit with krystexxa treatment for chronic, tophaceous, erosive gout secondary to chronic renal disease. He has been on allopurinol treatment without adequate response. He is not a candidate for methotrexate treatment due to CKD stage 4.  He plans on switching insurances effective next month so wanted to delay trying any application or scheduling until January.

## 2021-06-24 ENCOUNTER — Other Ambulatory Visit: Payer: Self-pay | Admitting: Hematology and Oncology

## 2021-07-05 ENCOUNTER — Other Ambulatory Visit: Payer: Self-pay | Admitting: Internal Medicine

## 2021-07-05 DIAGNOSIS — M1A9XX1 Chronic gout, unspecified, with tophus (tophi): Secondary | ICD-10-CM

## 2021-07-09 ENCOUNTER — Other Ambulatory Visit (HOSPITAL_COMMUNITY): Payer: Self-pay

## 2021-07-09 NOTE — Telephone Encounter (Signed)
Reached out to patient to confirm new insurance information to usbmit with [facility] referral for [place].  Form placed in Dr. Marveen Reeks folder - will need last OV note to be signed as well to be submitted with referral  Will continue to f/u  Knox Saliva, PharmD, MPH, BCPS Clinical Pharmacist (Rheumatology and Pulmonology)

## 2021-07-14 NOTE — Telephone Encounter (Signed)
Referral placed to [facility] for patient's Krystexxa infusion.  Fax: [phone] Phone: [phone]  Knox Saliva, PharmD, MPH, BCPS Clinical Pharmacist (Rheumatology and Pulmonology)

## 2021-07-16 NOTE — Progress Notes (Deleted)
Office Visit Note  Patient: Brad [name]             Date of Birth: 1956/02/02           MRN: [id number]             PCP: Ladell Pier, MD Referring: Ladell Pier, MD Visit Date: 07/17/2021   Subjective:  No chief complaint on file.   History of Present Illness: Brad [name] is a 66 y.o. male here for follow up for gout with flare. ***   Previous HPI 06/02/21 Brad [name] is a 66 y.o. 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 Still has mild pain and stiffness. He has not noticed any problem with allopurinol increase.   Previous HPI 04/28/21 Brad [name] is a 66 y.o. 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 He is currently taking allopurinol 50 mg PO daily after recent start and was prescribed colchicine 0.3 mg daily dose.  He did not start the colchicine due to excessive cost. At the ED evaluation he was prescribed prednisone 30 mg daily taken for the past 5 days and the tramadol as needed. He has felt partial improvement so far, back to about the starting amount of hand swelling when this episode began. His right wrist is the most painful area.   No Rheumatology ROS completed.   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 Social History   Social History Narrative   Not on file    There is no immunization history on file for this patient.   Objective: Vital Signs: There were no vitals taken for this visit.   Physical Exam   Musculoskeletal Exam: ***  CDAI Exam: CDAI Score: -- Patient Global: --; Provider Global: -- Swollen: --; Tender: -- Joint Exam 07/17/2021   No joint exam has been documented for this visit   There is currently no information documented on the homunculus. Go to the Rheumatology activity and complete the homunculus joint exam.  Investigation: No additional findings.  Imaging: No results found.  Recent Labs: Lab Results  Component Value Date   WBC 8.2 06/04/2021   HGB 10.2 (L) 06/04/2021   PLT 470 (H) 06/04/2021   NA 139 06/04/2021   K 4.3 06/04/2021   CL 108 06/04/2021   CO2 21 (L) 06/04/2021   GLUCOSE 125 (H) 06/04/2021   BUN 20 06/04/2021   CREATININE 2.12 (H) 06/04/2021   BILITOT 0.6 06/04/2021   ALKPHOS 53 06/04/2021   AST 16 06/04/2021   ALT 9 06/04/2021   PROT 7.5 06/04/2021   ALBUMIN 3.7 06/04/2021   CALCIUM 8.8 (L) 06/04/2021   GFRAA >60 11/25/2016  Speciality Comments: No specialty comments available.  Procedures:  No procedures performed Allergies: Patient has no known allergies.   Assessment / Plan:     Visit Diagnoses: No diagnosis found.  ***  Orders: No orders of the defined types were placed in this encounter.  No orders of the defined types were placed in this encounter.    Follow-Up Instructions: No follow-ups on file.   Collier Salina, MD  Note - This record has been created using [employer].  Chart creation errors have been sought, but may not always  have been located. Such creation errors do not reflect on  the standard of medical care.

## 2021-07-17 ENCOUNTER — Telehealth: Payer: Self-pay | Admitting: Internal Medicine

## 2021-07-17 ENCOUNTER — Ambulatory Visit: Payer: No Typology Code available for payment source | Admitting: Internal Medicine

## 2021-07-17 DIAGNOSIS — M1A9XX1 Chronic gout, unspecified, with tophus (tophi): Secondary | ICD-10-CM

## 2021-07-17 MED ORDER — METHYLPREDNISOLONE 4 MG PO TBPK: ORAL_TABLET | ORAL | 0 refills | Status: DC

## 2021-07-17 NOTE — Telephone Encounter (Signed)
Patient called the office to cancel an appointment he has today (1/27). Patient states he is in so much pain he cannot put clothes on to come to his appointment. Patient requests that [name] call in something for his pain. [facility] on [address].

## 2021-07-17 NOTE — Telephone Encounter (Signed)
FYI- I spoke with Brad [name] he is having a bad flare up with pain and swelling in his right leg from the knee down. His foot is painful and stiff and has no one else to drive him today so he cancelled the appointment. I recommend treatment with a 6 day steroid taper pack for acute gout flare. If pain is not adequately improved or coming back right away we should follow up at clinic for any longer term treatment plan. He said he will look into scheduling Wednesday or Thursday next week if needed.

## 2021-07-21 NOTE — Progress Notes (Signed)
Office Visit Note  Patient: Brad [name]             Date of Birth: May 08, 1956           MRN: [id number]             PCP: Ladell Pier, MD Referring: Ladell Pier, MD Visit Date: 07/22/2021   Subjective:  Other (Right leg pain, onset was last Thursday/Friday. Patient reports the first day of steroids did help. Patient reports he has used crutches for mobility. )   History of Present Illness: Brad [name] is a 66 y.o. male here for follow up for his gout due to new exacerbation symptoms started last Thursday with severe pain and swelling in the right knee.  He has been unable to walk unassisted requiring crutches or wheelchair.  We spoke on the phone called in a prescription for steroid taper took a Medrol Dosepak.  He felt the medicine was effective on the first day but symptoms quickly returned to severe pain and swelling on the steroid he has 1 day remaining currently cannot work due to pain.  Previous HPI 06/02/21 [name] is a 66 y.o. 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 Still has mild pain and stiffness. He has not noticed any problem with allopurinol increase.   Previous HPI 04/28/21 [name] is a 67 y.o. 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 He is currently taking allopurinol 50 mg PO daily after recent start and was prescribed colchicine 0.3 mg daily dose.  He did not start the colchicine due to excessive cost. At the ED evaluation he was prescribed prednisone 30 mg daily taken for the past 5 days and the tramadol as needed. He has felt partial improvement so far, back to about the starting amount of hand swelling when this episode began. His right wrist is the most painful  area.   Review of Systems  Constitutional:  Positive for fatigue.  HENT:  Positive for mouth dryness. Negative for mouth sores and nose dryness.   Eyes:  Negative for pain, itching and dryness.  Respiratory:  Negative for shortness of breath and difficulty breathing.   Cardiovascular:  Negative for chest pain and palpitations.  Gastrointestinal:  Negative for blood in stool, constipation and diarrhea.  Endocrine: Negative for increased urination.  Genitourinary:  Negative for difficulty urinating.  Musculoskeletal:  Positive for joint pain, joint pain, joint swelling, myalgias, muscle weakness, morning stiffness, muscle tenderness and myalgias.  Skin:  Negative for color change, rash and redness.  Allergic/Immunologic: Negative for susceptible to infections.  Neurological:  Negative for dizziness, numbness, headaches and memory loss.  Hematological:  Negative for bruising/bleeding tendency.  Psychiatric/Behavioral:  Positive for sleep disturbance. Negative for confusion.    PMFS History:  Patient Active Problem List   Diagnosis Date Noted   Paroxysmal atrial fibrillation ([facility]) 04/02/2021   Chronic kidney disease, stage IV (severe) ([facility]) 04/02/2021   Vitamin B 12 deficiency 01/31/2021   Chronic tophaceous gout 01/27/2021   Tobacco dependence 01/27/2021   Anemia, chronic disease 01/27/2021   Essential hypertension 11/21/2016    Past Medical History:  Diagnosis Date   Gout    Hypertension  Family History  Problem Relation Age of Onset   Diabetes Mother    History reviewed. No pertinent surgical history. Social History   Social History Narrative   Not on file    There is no immunization history on file for this patient.   Objective: Vital Signs: BP (!) 155/83 (BP Location: Left Arm, Patient Position: Sitting, Cuff Size: Normal)    Pulse (!) 102    Ht 6' (1.829 m)    Wt 155 lb (70.3 kg)    BMI 21.02 kg/m    Physical Exam Constitutional:      Comments: Uncomfortable  due to knee pain  Cardiovascular:     Rate and Rhythm: Regular rhythm. Tachycardia present.  Pulmonary:     Effort: Pulmonary effort is normal.     Breath sounds: Normal breath sounds.  Skin:    General: Skin is warm and dry.  Neurological:     Mental Status: He is alert.  Psychiatric:        Mood and Affect: Mood normal.     Musculoskeletal Exam:  Right knee soft tissue swelling warm to the touch tolerates very little range of motion, tenderness is most severe around the superior border of the patella with large tophaceous deposits present Other chronic extensive tophaceous disease in hands elbows and left leg without exacerbation  Investigation: No additional findings.  Imaging: No results found.  Recent Labs: Lab Results  Component Value Date   WBC 8.2 06/04/2021   HGB 10.2 (L) 06/04/2021   PLT 470 (H) 06/04/2021   NA 139 06/04/2021   K 4.3 06/04/2021   CL 108 06/04/2021   CO2 21 (L) 06/04/2021   GLUCOSE 125 (H) 06/04/2021   BUN 20 06/04/2021   CREATININE 2.12 (H) 06/04/2021   BILITOT 0.6 06/04/2021   ALKPHOS 53 06/04/2021   AST 16 06/04/2021   ALT 9 06/04/2021   PROT 7.5 06/04/2021   ALBUMIN 3.7 06/04/2021   CALCIUM 8.8 (L) 06/04/2021   GFRAA >60 11/25/2016    Speciality Comments: No specialty comments available.  Procedures:  Large Joint Inj on 07/22/2021 3:45 PM Indications: pain and joint swelling Details: 22 G 1.5 in needle, lateral approach Medications: 4 mL lidocaine 1 %; 40 mg triamcinolone acetonide 40 MG/ML  Initial attempt was made to aspirate fruit fluid from the suprapatellar pouch with 21-gauge needle no appreciable fluid collection was obtained.  After this lateral approach injection to the knee joint tolerated without any immediate problem.  Procedure, treatment alternatives, risks and benefits explained, specific risks discussed. Consent was given by the patient. Immediately prior to procedure a time out was called to verify the correct  patient, procedure, equipment, support staff and site/side marked as required. Patient was prepped and draped in the usual sterile fashion.    Allergies: Patient has no known allergies.   Assessment / Plan:     Visit Diagnoses: Chronic tophaceous gout - Plan: predniSONE (DELTASONE) 10 MG tablet  Acute exacerbation of his chronic gout right knee severely inflamed preventing him from working due to pain. Local steroid injection today to hopefully improve this as quickly as possible and decrease total steroid requirement. Also due to failure of 6 day taper will prescribe 12 day prednisone taper from 40 mg dose.  Orders: Orders Placed This Encounter  Procedures   Large Joint Inj   Meds ordered this encounter  Medications   predniSONE (DELTASONE) 10 MG tablet    Sig: Take 4 tablets (40 mg total) by mouth daily  with breakfast for 3 days, THEN 3 tablets (30 mg total) daily with breakfast for 3 days, THEN 2 tablets (20 mg total) daily with breakfast for 3 days, THEN 1 tablet (10 mg total) daily with breakfast for 3 days.    Dispense:  30 tablet    Refill:  0     Follow-Up Instructions: No follow-ups on file.   Collier Salina, MD  Note - This record has been created using [employer].  Chart creation errors have been sought, but may not always  have been located. Such creation errors do not reflect on  the standard of medical care.

## 2021-07-22 ENCOUNTER — Other Ambulatory Visit: Payer: Self-pay

## 2021-07-22 ENCOUNTER — Ambulatory Visit (INDEPENDENT_AMBULATORY_CARE_PROVIDER_SITE_OTHER): Payer: No Typology Code available for payment source | Admitting: Internal Medicine

## 2021-07-22 ENCOUNTER — Encounter: Payer: Self-pay | Admitting: Internal Medicine

## 2021-07-22 VITALS — BP 155/83 | HR 102 | Ht 72.0 in | Wt 155.0 lb

## 2021-07-22 DIAGNOSIS — M79604 Pain in right leg: Secondary | ICD-10-CM | POA: Diagnosis not present

## 2021-07-22 DIAGNOSIS — M1A9XX1 Chronic gout, unspecified, with tophus (tophi): Secondary | ICD-10-CM | POA: Diagnosis not present

## 2021-07-23 ENCOUNTER — Telehealth: Payer: Self-pay | Admitting: Internal Medicine

## 2021-07-23 MED ORDER — TRIAMCINOLONE ACETONIDE 40 MG/ML IJ SUSP
40.0000 mg | INTRAMUSCULAR | Status: AC | PRN
  Administered 2021-07-22: 40 mg via INTRA_ARTICULAR

## 2021-07-23 MED ORDER — PREDNISONE 10 MG PO TABS: ORAL_TABLET | ORAL | 0 refills | Status: DC

## 2021-07-23 MED ORDER — LIDOCAINE HCL 1 % IJ SOLN
4.0000 mL | INTRAMUSCULAR | Status: AC | PRN
  Administered 2021-07-22: 4 mL

## 2021-07-23 NOTE — Telephone Encounter (Signed)
Patient called the office stating he had an appointment yesterday 07/22/21 and [name] was supposed to call him in a steroid. Patient states the pharmacy says they do not have anything for him. Patient states he would like that called in as soon as possible.

## 2021-07-23 NOTE — Telephone Encounter (Signed)
Patient advised [name] is out of the office and I will forward the message.

## 2021-07-23 NOTE — Telephone Encounter (Signed)
I did intend to send him a prednisone prescription after our visit but apparently never sent to order. This is sent now.

## 2021-07-24 NOTE — Telephone Encounter (Signed)
Left message to advise patient prescription for Prednisone has been sent to the pharmacy.

## 2021-07-29 ENCOUNTER — Telehealth: Payer: Self-pay

## 2021-07-29 DIAGNOSIS — M1A9XX1 Chronic gout, unspecified, with tophus (tophi): Secondary | ICD-10-CM

## 2021-07-29 NOTE — Telephone Encounter (Signed)
Patient called stating he is still experiencing pain in his right leg.  Patient states he has been out of work for the past 2 weeks and doesn't feel like the medicine or cortisone injection has helped.  Patient requested to speak with [name] directly.

## 2021-07-30 ENCOUNTER — Telehealth: Payer: Self-pay

## 2021-07-30 DIAGNOSIS — M1A9XX1 Chronic gout, unspecified, with tophus (tophi): Secondary | ICD-10-CM

## 2021-07-30 MED ORDER — HYDROCODONE-ACETAMINOPHEN 5-325 MG PO TABS: 1.0000 | ORAL_TABLET | Freq: Four times a day (QID) | ORAL | 0 refills | Status: DC | PRN

## 2021-07-30 MED ORDER — HYDROCODONE-ACETAMINOPHEN 5-325 MG PO TABS: 1.0000 | ORAL_TABLET | Freq: Four times a day (QID) | ORAL | 0 refills | Status: AC | PRN

## 2021-07-30 MED ORDER — PREDNISONE 20 MG PO TABS: 60.0000 mg | ORAL_TABLET | Freq: Every day | ORAL | 0 refills | Status: AC

## 2021-07-30 NOTE — Telephone Encounter (Signed)
Called [facility] for update on patient's Krystexxa infusion. Per rep, as of 07/27/21, they are waiting on insurance authorization.  Phone: [phone]  Knox Saliva, PharmD, MPH, BCPS Clinical Pharmacist (Rheumatology and Pulmonology)

## 2021-07-30 NOTE — Telephone Encounter (Signed)
Patient called stating he was able to pick up his prescription of Prednisone, but [facility].  Patient states he called and found that [facility] [address] [facility] the medication in stock.  Patient is requesting the prescription be sent to them.

## 2021-07-30 NOTE — Telephone Encounter (Signed)
Attempted to contact patient and unable to leave message, voicemail box is full.

## 2021-07-30 NOTE — Telephone Encounter (Signed)
His symptoms are not much improved so far after knee injection and oral prednisone for 1 week. He has not been able to return to work yet due to pain and swelling. I will send in a new prescription for higher dose prednisone 60 mg also Norco 5 mg PRN for 5 days.

## 2021-07-30 NOTE — Telephone Encounter (Signed)
5 day Norco Rx sent to [facility] instead.

## 2021-07-31 ENCOUNTER — Other Ambulatory Visit: Payer: Self-pay | Admitting: Internal Medicine

## 2021-07-31 DIAGNOSIS — M1A9XX1 Chronic gout, unspecified, with tophus (tophi): Secondary | ICD-10-CM

## 2021-08-06 NOTE — Telephone Encounter (Signed)
Called [facility] for update on patient's Krystexxa referral. Per rep, [name] was denied due to patient not having tried Uloric at max dose for 3 months. Provider can complete peer-to-peer within 19 days. Rep does not have denial date.  [facility] Phone: [phone]  Awaiting denial letter to be faxed to clinic  Knox Saliva, PharmD, MPH, BCPS Clinical Pharmacist (Rheumatology and Pulmonology)

## 2021-08-10 ENCOUNTER — Telehealth: Payer: Self-pay | Admitting: Internal Medicine

## 2021-08-10 DIAGNOSIS — M79672 Pain in left foot: Secondary | ICD-10-CM

## 2021-08-10 DIAGNOSIS — G8929 Other chronic pain: Secondary | ICD-10-CM

## 2021-08-10 DIAGNOSIS — M1A9XX1 Chronic gout, unspecified, with tophus (tophi): Secondary | ICD-10-CM

## 2021-08-10 DIAGNOSIS — M79671 Pain in right foot: Secondary | ICD-10-CM

## 2021-08-10 NOTE — Telephone Encounter (Signed)
Patient called the office stating he was supposed to have a follow up with [name] after he finished the Prednisone. Patient states he is doing much better and would like to talk on the phone with [name] to let him know how he's doing. Patient states he would like another refill of the prednisone because it helped. [facility] [address]

## 2021-08-11 ENCOUNTER — Telehealth: Payer: Self-pay | Admitting: Internal Medicine

## 2021-08-11 NOTE — Telephone Encounter (Signed)
Annie Main, a Radio broadcast assistant, stopped by the office (08/11/21 4:30pm) stating the patients infusion was denied by his insurance. He states he would like Devki to give him a call regarding what to do next (appeal, ect.Marland Kitchen) He states we should also get a denial letter from the patients insurance and he would like a copy of that letter. Annie Main Picklesimer [phone] [email or web address]

## 2021-08-11 NOTE — Telephone Encounter (Signed)
Attempted to return call to discuss, no answer and voice mailbox is full. Will plan to reach out again tomorrow. We would need to discuss continuing prednisone in more detail since originally plan was for a limited treatment duration, we could continue at a lower daily dose for while longer if symptoms are returning after 2 week taper.

## 2021-08-12 NOTE — Telephone Encounter (Signed)
Attempted to contact the patient and unable to leave a message, voicemail is full.  

## 2021-08-12 NOTE — Telephone Encounter (Signed)
Patient advised we would need to discuss continuing prednisone in more detail since originally plan was for a limited treatment duration, we could continue at a lower daily dose for while longer if symptoms are returning after 2 week taper. Patient states his that his symptoms never went away on the Prednisone just improved slightly. Patient states he is now doing worse again after completing the 2 week course of Prednisone. Patient states he is having trouble walking due to the swelling and pain. Patient states he is using crutches and a walker to help him get around. Please advise.

## 2021-08-13 ENCOUNTER — Telehealth: Payer: Self-pay | Admitting: Internal Medicine

## 2021-08-13 DIAGNOSIS — M79671 Pain in right foot: Secondary | ICD-10-CM

## 2021-08-13 DIAGNOSIS — M1A9XX1 Chronic gout, unspecified, with tophus (tophi): Secondary | ICD-10-CM

## 2021-08-13 DIAGNOSIS — G8929 Other chronic pain: Secondary | ICD-10-CM

## 2021-08-13 MED ORDER — HYDROCODONE-ACETAMINOPHEN 5-325 MG PO TABS: 1.0000 | ORAL_TABLET | Freq: Three times a day (TID) | ORAL | 0 refills | Status: AC | PRN

## 2021-08-13 MED ORDER — HYDROCODONE-ACETAMINOPHEN 5-325 MG PO TABS: 1.0000 | ORAL_TABLET | Freq: Three times a day (TID) | ORAL | 0 refills | Status: DC | PRN

## 2021-08-13 MED ORDER — PREDNISONE 10 MG PO TABS: ORAL_TABLET | ORAL | 0 refills | Status: AC

## 2021-08-13 NOTE — Telephone Encounter (Signed)
I spoke with Brad [name], he reports not answering his phone yesterday due to walking with crutches and not carrying everything as usual. Still having considerable pain and cannot flex his knee significantly, ongoing almost a month now. I will continue the prednisone at a lower dose for the next 2 weeks and new prescription for 5mg  hydrocodone as needed. If symptoms are still not a lot better within 2 weeks he needs to come back again and reevalate because this is so much longer than his previous gout flares and may have some other problem going on.

## 2021-08-13 NOTE — Telephone Encounter (Signed)
Patient called the office stating [name] had sent in a prescription for Hydrocodone-acetaminophen 5-325mg  [facility] [address] and they contacted him to let him know there is a shortage of the 5-325mg  tablets. They informed [facility] that [facility] [address] [facility] and they require a new prescription.

## 2021-08-13 NOTE — Telephone Encounter (Signed)
New Rx sent to the [facility] with same details otherwise.

## 2021-08-13 NOTE — Telephone Encounter (Signed)
Patient advised prescription resent to [facility].

## 2021-08-13 NOTE — Addendum Note (Signed)
Addended by: Collier Salina on: 08/13/2021 10:35 AM   Modules accepted: Orders

## 2021-08-14 NOTE — Telephone Encounter (Signed)
Called insurance for denial letter for patient's Brad [name] since we have not yet received. Per rep, letter was mailed on 08/02/21 via regular mail. Rep was unable to confirm peer-to-peer timeline as manager who has access is not in office today  Phone: [phone]  Knox Saliva, PharmD, MPH, BCPS Clinical Pharmacist (Rheumatology and Pulmonology)

## 2021-08-18 NOTE — Telephone Encounter (Signed)
Emailed Annie Main advising that we are still awaiting Krystexxa denial letter from insurance and Dr. Marveen Reeks decision to appeal will be based on that denial letter and reasoning. F/u will occur in previous [facility] encounter  Knox Saliva, PharmD, MPH, BCPS Clinical Pharmacist (Rheumatology and Pulmonology)

## 2021-09-02 NOTE — Telephone Encounter (Signed)
Called patient's insurance regarding denial letter for Brad [name] that we have yet to receive. Per rep, there is no claim that was submitted. I advised that patient has not received infusion because we were seeking coverage. ? ?[employer] Administ[employer] Phone: [phone] ? ?Spoke with Annie Main, Passenger transport manager at [employer]. He states Erasmo Downer, Tourist information centre manager, was managing patient's case. Brad [name] was denied due to not meeting medical necessity. Patient must try Uloric and fail x 3 months at highest dose ? ?Call ref #: [id number] ?Authorization was submitted by [facility] on 07/20/21 ? ?I returned call to [facility] and provided this information again. Per rep, they only cover preventative and wellness visits. Anything that they don't cover, then his "secondary" [employer] may cover. I called [employer] and they said they do not cover specialists. They said patient has "healthcare membership" not health insurance ? ?[employer] Phone: [phone] ? ?Emailed Annie Main with [employer] with this information and requested patient's case manager provide me with her contact information for pt's insurance  ? ?Knox Saliva, PharmD, MPH, BCPS ?Clinical Pharmacist (Rheumatology and Pulmonology) ?

## 2021-09-28 ENCOUNTER — Telehealth: Payer: Self-pay

## 2021-09-28 NOTE — Telephone Encounter (Signed)
Patient called needing a another co-pay card for Eliquis due to cost. I informed patient that I will leave the $10 dollar co-pay card up at the front desk for him to pick up. Patient verbalized understanding and thanked me for the call. ?

## 2021-10-02 ENCOUNTER — Telehealth: Payer: Self-pay

## 2021-10-02 DIAGNOSIS — M1A9XX1 Chronic gout, unspecified, with tophus (tophi): Secondary | ICD-10-CM

## 2021-10-02 MED ORDER — PREDNISONE 10 MG PO TABS: ORAL_TABLET | ORAL | 0 refills | Status: AC

## 2021-10-02 NOTE — Telephone Encounter (Signed)
Patient advised Rx for prednisone x12 days sent. Krystexxa approval declined we need to schedule a follow up office appointment to discuss alternative options to prevent frequent recurrent flares. Patient scheduled for 10/27/2021.  ?

## 2021-10-02 NOTE — Telephone Encounter (Signed)
Attempted to return call no answer and VM full. Rx for prednisone x12 days sent. Krystexxa approval declined we need to schedule a follow up office appointment to discuss alternative options to prevent frequent recurrent flares.

## 2021-10-02 NOTE — Telephone Encounter (Signed)
Patient called stating he is having another gout flair in his right knee.  Patient states it is swollen and painful.  Patient [facility]   ?

## 2021-10-13 ENCOUNTER — Other Ambulatory Visit: Payer: Self-pay | Admitting: Internal Medicine

## 2021-10-13 DIAGNOSIS — M1A9XX1 Chronic gout, unspecified, with tophus (tophi): Secondary | ICD-10-CM

## 2021-10-22 NOTE — Progress Notes (Deleted)
Office Visit Note  Patient: Brad [name]             Date of Birth: [birth year]           MRN: [id number]             PCP: Ladell Pier, MD Referring: Ladell Pier, MD Visit Date: 10/27/2021   Subjective:  No chief complaint on file.   History of Present Illness: Brad [name] is a 66 y.o. 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  Previous HPI 07/22/2021 Brad [name] is a 66 y.o. male here for follow up for his gout due to new exacerbation symptoms started last Thursday with severe pain and swelling in the right knee.  He has been unable to walk unassisted requiring crutches or wheelchair.  We spoke on the phone called in a prescription for steroid taper took a Medrol Dosepak.  He felt the medicine was effective on the first day but symptoms quickly returned to severe pain and swelling on the steroid he has 1 day remaining currently cannot work due to pain.   Previous HPI 06/02/21 Brad [name] is a 66 y.o. 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 Still has mild pain and stiffness. He has not noticed any problem with allopurinol increase.   Previous HPI 04/28/21 Brad [name] is a 66 y.o. 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 He is currently taking allopurinol 50 mg PO daily after recent start and was prescribed colchicine 0.3 mg daily dose.  He did not start the colchicine due to excessive cost. At the ED evaluation he was prescribed prednisone 30 mg daily taken for the past 5 days and the tramadol as needed. He has felt partial improvement so far, back to about the starting amount of hand swelling when this episode began. His  right wrist is the most painful area.   No Rheumatology ROS completed.   PMFS History:  Patient Active Problem List   Diagnosis Date Noted   Paroxysmal atrial fibrillation ([facility]) 04/02/2021   Chronic kidney disease, stage IV (severe) ([place]) 04/02/2021   Vitamin B 12 deficiency 01/31/2021   Chronic tophaceous gout 01/27/2021   Tobacco dependence 01/27/2021   Anemia, chronic disease 01/27/2021   Essential hypertension 11/21/2016    Past Medical History:  Diagnosis Date   Gout    Hypertension     Family History  Problem Relation Age of Onset   Diabetes Mother    No past surgical history on file. Social History   Social History Narrative   Not on file    There is no immunization history on file for this patient.   Objective: Vital Signs: There were no vitals taken for this visit.   Physical Exam   Musculoskeletal Exam: ***  CDAI Exam: CDAI Score: -- Patient Global: --; Provider Global: -- Swollen: --; Tender: -- Joint Exam 10/27/2021   No joint exam has been documented for this visit   There is currently no information documented on the homunculus. Go to the Rheumatology activity and complete the homunculus joint exam.  Investigation: No additional findings.  Imaging: No results found.  Recent Labs: Lab Results  Component Value Date   WBC 8.2 06/04/2021   HGB 10.2 (L) 06/04/2021   PLT 470 (H) 06/04/2021   NA 139 06/04/2021   K 4.3 06/04/2021   CL 108 06/04/2021   CO2 21 (L) 06/04/2021   GLUCOSE 125 (H) 06/04/2021   BUN 20 06/04/2021   CREATININE 2.12 (H) 06/04/2021   BILITOT 0.6 06/04/2021   ALKPHOS 53 06/04/2021   AST 16 06/04/2021   ALT 9 06/04/2021   PROT 7.5 06/04/2021   ALBUMIN 3.7 06/04/2021   CALCIUM 8.8 (L) 06/04/2021   GFRAA >60 11/25/2016    Speciality Comments: No specialty comments available.  Procedures:  No procedures performed Allergies: Patient has no known allergies.   Assessment / Plan:     Visit Diagnoses: No  diagnosis found.  ***  Orders: No orders of the defined types were placed in this encounter.  No orders of the defined types were placed in this encounter.    Follow-Up Instructions: No follow-ups on file.   Earnestine Mealing, CMA  Note - This record has been created using [employer].  Chart creation errors have been sought, but may not always  have been located. Such creation errors do not reflect on  the standard of medical care.

## 2021-10-27 ENCOUNTER — Ambulatory Visit: Payer: No Typology Code available for payment source | Admitting: Internal Medicine

## 2021-10-27 ENCOUNTER — Telehealth: Payer: Self-pay | Admitting: *Deleted

## 2021-10-27 DIAGNOSIS — M1A9XX1 Chronic gout, unspecified, with tophus (tophi): Secondary | ICD-10-CM

## 2021-10-27 MED ORDER — HYDROCODONE-ACETAMINOPHEN 5-325 MG PO TABS: 1.0000 | ORAL_TABLET | Freq: Four times a day (QID) | ORAL | 0 refills | Status: AC | PRN

## 2021-10-27 MED ORDER — PREDNISONE 10 MG PO TABS: ORAL_TABLET | ORAL | 0 refills | Status: DC

## 2021-10-27 NOTE — Telephone Encounter (Signed)
Patient states he is suppose to have an appointment to discuss treatment options for gout. Patient states he is having pain in  both hands, shoulders and legs. Patient states he is unable to walk. Patient states he has been unable to work and states it took him an hour just to raise up to get the phone to call the office. Patient is requesting a prescription for Prednisone and the pain medication you have called in previously as he is in a lot of pain.  [facility]  ?

## 2021-10-27 NOTE — Telephone Encounter (Signed)
New Rx sent for 6 day prednisone taper and up to  days PRN hydrocodone. [facility] We can reschedule appt ideally later this week if getting better.

## 2021-10-27 NOTE — Telephone Encounter (Signed)
Patient advised new Rx sent for 6 day prednisone taper and up to  days PRN hydrocodone. [facility]. We can reschedule appt ideally later this week if getting better. Patient rescheduled for 10/30/2021 at 11:00 am.  ?

## 2021-10-28 NOTE — Progress Notes (Signed)
? ?Office Visit Note ? ?Patient: Brad [name]             ?Date of Birth: [birth year]           ?MRN: [id number]             ?PCP: Ladell Pier, MD ?Referring: Ladell Pier, MD ?Visit Date: 10/30/2021 ? ? ?Subjective:  ?Medication Management (Not doing good, medication discussion ) ? ? ?History of Present Illness: Brad [name] is a 66 y.o. male here for follow up for chronic tophaceous gout of multiple areas. He has had almost constant flare ups with joint pain since our last visit he improves while taking prednisone but symptoms return within days. He has new tophaceous deposits including in his right shoulder. He had some drainage form nodules on right middle finger and elbow and treated this with peroxide and has some residual hyperpigmentation in those areas. ? ?Previous HPI ?07/22/2021 ?Brad [name] is a 66 y.o. male here for follow up for his gout due to new exacerbation symptoms started last Thursday with severe pain and swelling in the right knee.  He has been unable to walk unassisted requiring crutches or wheelchair.  We spoke on the phone called in a prescription for steroid taper took a Medrol Dosepak.  He felt the medicine was effective on the first day but symptoms quickly returned to severe pain and swelling on the steroid he has 1 day remaining currently cannot work due to pain. ?  ?Previous HPI ?06/02/21 ?Summit Arroyave is a 66 y.o. 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 Still has mild pain and stiffness. He has not noticed any problem with allopurinol increase. ?  ?Previous HPI ?04/28/21 ?Brad [name] is a 67 y.o. 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 He is currently taking allopurinol 50 mg PO daily  after recent start and was prescribed colchicine 0.3 mg daily dose.  He did not start the colchicine due to excessive cost. At the ED evaluation he was prescribed prednisone 30 mg daily taken for the past 5 days and the tramadol as needed. He has felt partial improvement so far, back to about the starting amount of hand swelling when this episode began. His right wrist is the most painful area. ? ? ?Review of Systems  ?Constitutional:  Negative for fatigue.  ?HENT:  Positive for mouth dryness.   ?Eyes:  Negative for dryness.  ?Respiratory:  Negative for shortness of breath.   ?Cardiovascular:  Positive for swelling in legs/feet.  ?Gastrointestinal:  Negative for constipation.  ?Endocrine: Positive for cold intolerance and excessive thirst.  ?Genitourinary:  Negative for difficulty urinating.  ?Musculoskeletal:  Positive for joint pain, gait problem, joint pain and morning stiffness.  ?Skin:  Positive for nodules/bumps.  ?Allergic/Immunologic: Negative for susceptible to infections.  ?Neurological:  Negative for numbness.  ?Hematological:  Negative for bruising/bleeding tendency.  ?Psychiatric/Behavioral:  Positive for sleep disturbance.   ? ?PMFS History:  ?Patient Active Problem List  ? Diagnosis Date Noted  ? Paroxysmal atrial fibrillation ([place]) 04/02/2021  ? Chronic kidney disease, stage IV (severe) ([place]) 04/02/2021  ? Vitamin B 12 deficiency 01/31/2021  ? Chronic tophaceous gout 01/27/2021  ? Tobacco dependence 01/27/2021  ?  Anemia, chronic disease 01/27/2021  ? Essential hypertension 11/21/2016  ?  ?Past Medical History:  ?Diagnosis Date  ? Gout   ? Hypertension   ?  ?Family History  ?Problem Relation Age of Onset  ? Diabetes Mother   ? ?History reviewed. No pertinent surgical history. ?Social History  ? ?Social History Narrative  ? Not on file  ? ? ?There is no immunization history on file for this patient.  ? ?Objective: ?Vital Signs: BP 126/80 (BP Location: Left Arm, Patient Position: Sitting, Cuff Size:  Normal)   Pulse 97   Resp 18   Ht 6' (1.829 m)   Wt 145 lb (65.8 kg)   BMI 19.67 kg/m?   ? ?Physical Exam ?Constitutional:   ?   Comments: In wheelchair  ?Cardiovascular:  ?   Rate and Rhythm: Normal rate and regular rhythm.  ?Pulmonary:  ?   Effort: Pulmonary effort is normal.  ?   Breath sounds: Normal breath sounds.  ?Musculoskeletal:     ?   General: Swelling, tenderness and deformity present.  ?   Right lower leg: No edema.  ?   Left lower leg: No edema.  ?Skin: ?   General: Skin is warm and dry.  ?Neurological:  ?   Mental Status: He is alert.  ?Psychiatric:     ?   Mood and Affect: Mood normal.  ?  ? ?Musculoskeletal Exam:  ?Right shoulder anterior nodule mildly tender and decreased ROM ?Elbow extension decreased, large right olecranon bursa nodule and overlying hyperpigmentation ?Numerous tophi over wrist and finger joints of both hands, right 3rd finger with hyperpigmentation around PIP joint healing scab or small lesions present, cannot tightly close grip ?Knees anterior and lateral tenderness nodule present no palpable effusions ? ?Investigation: ?No additional findings. ? ?Imaging: ?No results found. ? ?Recent Labs: ?Lab Results  ?Component Value Date  ? WBC 8.2 06/04/2021  ? HGB 10.2 (L) 06/04/2021  ? PLT 470 (H) 06/04/2021  ? NA 139 06/04/2021  ? K 4.3 06/04/2021  ? CL 108 06/04/2021  ? CO2 21 (L) 06/04/2021  ? GLUCOSE 125 (H) 06/04/2021  ? BUN 20 06/04/2021  ? CREATININE 2.12 (H) 06/04/2021  ? BILITOT 0.6 06/04/2021  ? ALKPHOS 53 06/04/2021  ? AST 16 06/04/2021  ? ALT 9 06/04/2021  ? PROT 7.5 06/04/2021  ? ALBUMIN 3.7 06/04/2021  ? CALCIUM 8.8 (L) 06/04/2021  ? GFRAA >60 11/25/2016  ? ? ?Speciality Comments: No specialty comments available. ? ?Procedures:  ?No procedures performed ?Allergies: Patient has no known allergies.  ? ?Assessment / Plan:     ?Visit Diagnoses: Chronic tophaceous gout  - Plan: predniSONE (DELTASONE) 10 MG tablet, Uric acid, febuxostat (ULORIC) 40 MG tablet ? ?Uncontrolled  he has extensive tophaceous deposits and pretty much constant inflammation whenever he is off prednisone.  His work is limited to not more than 2 or 3 hours/day and mobility is severely affected.  Not tolerating more aggressive titration of allopurinol due to risk with his renal disease.  We will try the Uloric 40 mg daily to replace allopurinol I suspect long term would benefit with krystexxa for treatment.  We will resume another prednisone taper starting at 60 mg dose but plan to keep him on 10 mg until follow-up since symptoms have been returning immediately on cessation. ? ?Chronic kidney disease, stage IV (severe) ([place]) - Plan: COMPLETE METABOLIC PANEL WITH GFR, CBC with Differential/Platelet ? ?Believe his severe gout is primarily secondary to advanced chronic renal disease  also limiting ability to quickly titrate or tolerate medications.  We will recheck CBC and CMP at this time planning medication change. ? ?Orders: ?Orders Placed This Encounter  ?Procedures  ? Uric acid  ? COMPLETE METABOLIC PANEL WITH GFR  ? CBC with Differential/Platelet  ? ?Meds ordered this encounter  ?Medications  ? predniSONE (DELTASONE) 10 MG tablet  ?  Sig: Take by mouth once daily 6, 5, 4, 3, 2, then 1 tablet daily until follow up  ?  Dispense:  50 tablet  ?  Refill:  0  ? febuxostat (ULORIC) 40 MG tablet  ?  Sig: Take 1 tablet (40 mg total) by mouth daily.  ?  Dispense:  30 tablet  ?  Refill:  1  ? ? ? ?Follow-Up Instructions: Return in about 4 weeks (around 11/27/2021) for Gout uloric start f/u 4-5 wks. ? ? ?Collier Salina, MD ? ?Note - This record has been created using [employer].  ?Chart creation errors have been sought, but may not always  ?have been located. Such creation errors do not reflect on  ?the standard of medical care.  ?

## 2021-10-30 ENCOUNTER — Encounter: Payer: Self-pay | Admitting: Internal Medicine

## 2021-10-30 ENCOUNTER — Ambulatory Visit (INDEPENDENT_AMBULATORY_CARE_PROVIDER_SITE_OTHER): Payer: No Typology Code available for payment source | Admitting: Internal Medicine

## 2021-10-30 VITALS — BP 126/80 | HR 97 | Resp 18 | Ht 72.0 in | Wt 145.0 lb

## 2021-10-30 DIAGNOSIS — M1A9XX1 Chronic gout, unspecified, with tophus (tophi): Secondary | ICD-10-CM

## 2021-10-30 DIAGNOSIS — N184 Chronic kidney disease, stage 4 (severe): Secondary | ICD-10-CM

## 2021-10-30 MED ORDER — PREDNISONE 10 MG PO TABS: ORAL_TABLET | ORAL | 0 refills | Status: DC

## 2021-10-30 MED ORDER — FEBUXOSTAT 40 MG PO TABS: 40.0000 mg | ORAL_TABLET | Freq: Every day | ORAL | 1 refills | Status: AC

## 2021-10-31 LAB — CBC WITH DIFFERENTIAL/PLATELET
Absolute Monocytes: 1027 cells/uL — ABNORMAL HIGH (ref 200–950)
Basophils Absolute: 15 cells/uL (ref 0–200)
Basophils Relative: 0.1 %
Eosinophils Absolute: 15 cells/uL (ref 15–500)
Eosinophils Relative: 0.1 %
HCT: 24.5 % — ABNORMAL LOW (ref 38.5–50.0)
Hemoglobin: 7.9 g/dL — ABNORMAL LOW (ref 13.2–17.1)
Lymphs Abs: 1208 cells/uL (ref 850–3900)
MCH: 28.4 pg (ref 27.0–33.0)
MCHC: 32.2 g/dL (ref 32.0–36.0)
MCV: 88.1 fL (ref 80.0–100.0)
MPV: 9.2 fL (ref 7.5–12.5)
Monocytes Relative: 6.8 %
Neutro Abs: 12835 cells/uL — ABNORMAL HIGH (ref 1500–7800)
Neutrophils Relative %: 85 %
Platelets: 883 10*3/uL — ABNORMAL HIGH (ref 140–400)
RBC: 2.78 10*6/uL — ABNORMAL LOW (ref 4.20–5.80)
RDW: 17.5 % — ABNORMAL HIGH (ref 11.0–15.0)
Total Lymphocyte: 8 %
WBC: 15.1 10*3/uL — ABNORMAL HIGH (ref 3.8–10.8)

## 2021-10-31 LAB — COMPLETE METABOLIC PANEL WITH GFR
AG Ratio: 0.8 (calc) — ABNORMAL LOW (ref 1.0–2.5)
ALT: 7 U/L — ABNORMAL LOW (ref 9–46)
AST: 9 U/L — ABNORMAL LOW (ref 10–35)
Albumin: 3 g/dL — ABNORMAL LOW (ref 3.6–5.1)
Alkaline phosphatase (APISO): 50 U/L (ref 35–144)
BUN/Creatinine Ratio: 15 (calc) (ref 6–22)
BUN: 48 mg/dL — ABNORMAL HIGH (ref 7–25)
CO2: 19 mmol/L — ABNORMAL LOW (ref 20–32)
Calcium: 8.5 mg/dL — ABNORMAL LOW (ref 8.6–10.3)
Chloride: 102 mmol/L (ref 98–110)
Creat: 3.18 mg/dL — ABNORMAL HIGH (ref 0.70–1.35)
Globulin: 3.7 g/dL (calc) (ref 1.9–3.7)
Glucose, Bld: 144 mg/dL — ABNORMAL HIGH (ref 65–99)
Potassium: 4.7 mmol/L (ref 3.5–5.3)
Sodium: 132 mmol/L — ABNORMAL LOW (ref 135–146)
Total Bilirubin: 0.2 mg/dL (ref 0.2–1.2)
Total Protein: 6.7 g/dL (ref 6.1–8.1)
eGFR: 21 mL/min/{1.73_m2} — ABNORMAL LOW (ref >=60)

## 2021-10-31 LAB — URIC ACID: Uric Acid, Serum: 11.7 mg/dL — ABNORMAL HIGH (ref 4.0–8.0)

## 2021-11-02 ENCOUNTER — Encounter (HOSPITAL_COMMUNITY): Payer: Self-pay

## 2021-11-02 ENCOUNTER — Telehealth: Payer: Self-pay | Admitting: *Deleted

## 2021-11-02 ENCOUNTER — Emergency Department (HOSPITAL_COMMUNITY)
Admission: EM | Admit: 2021-11-02 | Discharge: 2021-11-02 | Disposition: A | Discharge status: Home / Self Care | Payer: No Typology Code available for payment source | Attending: Emergency Medicine | Admitting: Emergency Medicine

## 2021-11-02 ENCOUNTER — Other Ambulatory Visit: Payer: Self-pay

## 2021-11-02 ENCOUNTER — Emergency Department (HOSPITAL_BASED_OUTPATIENT_CLINIC_OR_DEPARTMENT_OTHER)
Admit: 2021-11-02 | Discharge: 2021-11-02 | Disposition: A | Discharge status: Home / Self Care | Payer: No Typology Code available for payment source

## 2021-11-02 DIAGNOSIS — M79605 Pain in left leg: Secondary | ICD-10-CM

## 2021-11-02 DIAGNOSIS — M1A9XX1 Chronic gout, unspecified, with tophus (tophi): Secondary | ICD-10-CM

## 2021-11-02 DIAGNOSIS — Z7901 Long term (current) use of anticoagulants: Secondary | ICD-10-CM | POA: Insufficient documentation

## 2021-11-02 MED ORDER — OXYCODONE-ACETAMINOPHEN 5-325 MG PO TABS
1.0000 | ORAL_TABLET | Freq: Once | ORAL | Status: AC
  Administered 2021-11-02: 1 via ORAL
  Filled 2021-11-02: qty 1

## 2021-11-02 MED ORDER — HYDROCODONE-ACETAMINOPHEN 5-325 MG PO TABS: 1.0000 | ORAL_TABLET | Freq: Four times a day (QID) | ORAL | 0 refills | Status: AC | PRN

## 2021-11-02 NOTE — Progress Notes (Signed)
Uric acid is very uncontrolled 11.7 so the allopurinol was definitely not working well enough. He can try starting the uloric as planned. ?His kidney function numbers are slightly worse than before creatinine up to 3.1 from 2.1 and his hemoglobin is 7.9 which is down from 10. ?I recommend he reach out to his kidney doctor's office or we can. If they do not have a plan to see him, we should recheck these in about a week to make sure it is not getting worse.

## 2021-11-02 NOTE — ED Triage Notes (Addendum)
Per EMS- Patient c/o left leg pain and swelling x 1 week. Patient reports a history of gout. ? ? ?Patient added in triage that he has bilateral shoulder and hand pain as well. ?

## 2021-11-02 NOTE — Addendum Note (Signed)
Addended by: Collier Salina on: 11/02/2021 04:41 PM ? ? Modules accepted: Orders ? ?

## 2021-11-02 NOTE — ED Notes (Signed)
Pt cursing in triage, pt demanding a pepsi, explained to pt we need to get him triaged and the Dr to see him first. Pt unhappy with that answer and continues to curse. ?

## 2021-11-02 NOTE — ED Provider Notes (Signed)
[facility] DEPT Provider Note   CSN: [id number] Arrival date & time: 11/02/21  1209     History  Chief Complaint  Patient presents with   Leg Swelling   Leg Pain   Shoulder Pain    Brad [name] is a 66 y.o. male with a past medical history of chronic tophaceous gout who presents to the emergency department complaining of left leg pain and swelling onset 1 week.  He notes that he was evaluated by his rheumatologist and had his medications changed recently.  Denies fever, chills. He notes that his symptoms improve with prednisone.   Per pt chart review: He is evaluated by rheumatologist, [name] with his most recent visit being on 10/30/2021.  At that time his rheumatologist noted that his uric acid was uncontrolled at 11.7, he was switched from allopurinol to Uloric.  Patient was started on 60 mg prednisone at that visit.  Patient has a follow up on 11/27/21.   The history is provided by the patient. No language interpreter was used.      Home Medications Prior to Admission medications   Medication Sig Start Date End Date Taking? Authorizing Provider  amLODipine (NORVASC) 5 MG tablet Take 1 tablet (5 mg total) by mouth daily. 01/27/21   Ladell Pier, MD  apixaban (ELIQUIS) 5 MG TABS tablet Take 1 tablet (5 mg total) by mouth 2 (two) times daily. 04/02/21   Jerline Pain, MD  [facility] VITAMIN B12 1000 MCG tablet TAKE 1 TABLET BY MOUTH EVERY DAY 06/28/21   Orson Slick, MD  febuxostat (ULORIC) 40 MG tablet Take 1 tablet (40 mg total) by mouth daily. 10/30/21   Collier Salina, MD  LISINOPRIL PO Lisinopril Patient not taking: Reported on 07/22/2021    [provider]  predniSONE (DELTASONE) 10 MG tablet Take by mouth once daily 6, 5, 4, 3, 2, then 1 tablet daily until follow up 10/30/21   Rice, Resa Miner, MD      Allergies    Patient has no known allergies.    Review of Systems   Review of Systems  All other systems reviewed and  are negative.  Physical Exam Updated Vital Signs BP (!) 146/91 (BP Location: Left Arm)   Pulse 91   Temp 98.1 F (36.7 C) (Oral)   Resp 18   Ht 6' (1.829 m)   Wt 65.8 kg   SpO2 100%   BMI 19.67 kg/m  Physical Exam Vitals and nursing note reviewed.  Constitutional:      General: He is not in acute distress.    Appearance: He is not diaphoretic.  HENT:     Head: Normocephalic and atraumatic.     Mouth/Throat:     Pharynx: No oropharyngeal exudate.  Eyes:     General: No scleral icterus.    Conjunctiva/sclera: Conjunctivae normal.  Cardiovascular:     Rate and Rhythm: Normal rate and regular rhythm.     Pulses: Normal pulses.     Heart sounds: Normal heart sounds.  Pulmonary:     Effort: Pulmonary effort is normal. No respiratory distress.     Breath sounds: Normal breath sounds. No wheezing.  Abdominal:     General: Bowel sounds are normal.     Palpations: Abdomen is soft. There is no mass.     Tenderness: There is no abdominal tenderness. There is no guarding or rebound.  Musculoskeletal:        General: Normal range of  motion.     Cervical back: Normal range of motion and neck supple.     Comments: Tophi noted to bilateral hands and bilateral knees with tenderness to palpation noted to regions.  Nodule noted to right anterior shoulder with tenderness to palpation.  No increased warmth noted to joints.  No surrounding erythema or effusion noted to joints.  Decreased range of motion of left knee secondary to pain.  Skin:    General: Skin is warm and dry.  Neurological:     Mental Status: He is alert.  Psychiatric:        Behavior: Behavior normal.    ED Results / Procedures / Treatments   Labs (all labs ordered are listed, but only abnormal results are displayed) Labs Reviewed - No data to display  EKG None  Radiology No results found.  Procedures Procedures    Medications Ordered in ED Medications - No data to display  ED Course/ Medical Decision  Making/ A&P Clinical Course as of 11/02/21 1651  Mon Nov 02, 2021  1552 Discussion with Marya Amsler from vascular who noted that patient is negative for DVT at this time. [SB]  1626 Discussed with patient discharge treatment plan.  Answered all available questions.  Patient appears safe for discharge at this time. [SB]    Clinical Course User Index [SB] Cyra Spader A, PA-C                           Medical Decision Making Risk Prescription drug management.   Pt presents with left leg pain/swelling onset 1 week.  Patient has a history of chronic tophaceous gout.  He has a rheumatologist with his recent visit being on 10/30/2021.  He has been taking his prescription Deltasone and Uloric.  Vital signs stable, patient afebrile, not tachycardic or hypoxic. On exam, pt with tophi noted to bilateral hands and bilateral knees with tenderness to palpation noted to regions.  Nodule noted to right anterior shoulder with tenderness to palpation.  No increased warmth noted to joints.  No surrounding erythema or effusion noted to joints.  Decreased range of motion of left knee secondary to pain.  No acute cardiovascular, respiratory, abdominal exam findings. Differential diagnosis includes gout flare, septic arthritis, DVT.   Additional history obtained:  External records from outside source obtained and reviewed including: Patient was evaluated by his rheumatologist, [name] on 10/30/2021.  He is followed by [name] for his chronic tophaceous gout.  At that time he was switched from allopurinol to Uloric.  Patient was started on Deltasone at that visit.  Follow-up appointment with his rheumatologist [name] is on 11/27/2021.   Imaging: I ordered imaging studies including DVT US I independently visualized and interpreted imaging which showed: Negative for DVT I agree with the radiologist interpretation  Medications:  I ordered medication including Percocet for symptom management Reevaluation of the patient  after these medicines and interventions, I reevaluated the patient and found that they have improved I have reviewed the patients home medicines and have made adjustments as needed  SDOH Screenings   Alcohol Screen: Not on file  Depression (PHQ2-9): Low Risk    PHQ-2 Score: 0  Financial Resource Strain: Not on file  Food Insecurity: Not on file  Housing: Not on file  Physical Activity: Not on file  Social Connections: Not on file  Stress: Not on file  Tobacco Use: High Risk   Smoking Tobacco Use: Every Day  Smokeless Tobacco Use: Never   Passive Exposure: Not on file  Transportation Needs: Not on file   Disposition: Presentation suspicious for gout exacerbation.  Doubt DVT at this time, ultrasound negative.  Doubt septic arthritis, no increased warmth noted to the joint, patient afebrile. After consideration of the diagnostic results and the patients response to treatment, I feel that the patient would benefit from Discharge home.  Discussed with patient importance of maintaining follow-up with his rheumatologist and to call to set up a closer appointment.  Also discussed with patient importance of making sure that he continues with his prescriptions as per his rheumatologist.  Supportive care measures and strict return precautions discussed with patient at bedside. Pt acknowledges and verbalizes understanding. Pt appears safe for discharge. Follow up as indicated in discharge paperwork.    This chart was dictated using voice recognition software, Dragon. Despite the best efforts of this provider to proofread and correct errors, errors may still occur which can change documentation meaning.  Final Clinical Impression(s) / ED Diagnoses Final diagnoses:  Left leg pain    Rx / DC Orders ED Discharge Orders     None         Dianely Krehbiel A, PA-C 11/02/21 1805    Carmin Muskrat, MD 11/10/21 [phone]

## 2021-11-02 NOTE — Telephone Encounter (Signed)
I called patient, appt 11/06/2021. ?

## 2021-11-02 NOTE — Progress Notes (Signed)
Left lower extremity venous duplex has been completed. ?Preliminary results can be found in CV Proc through chart review.  ?Results were given to [facility] PA. ? ?11/02/21 3:53 PM ?Carlos Levering RVT   ?

## 2021-11-02 NOTE — Telephone Encounter (Signed)
Patient returned call to the office. Patient states he started the Uloric on Saturday. Patient states he is unable to work, walk or go to the bathroom himself.  ?

## 2021-11-02 NOTE — Telephone Encounter (Signed)
Patient contacted the office and left message stating he would like a refill on the Hydrocodone. Patient states this is the only thing that helps his pain. Patient states the pain is worse and his whole left leg is swollen.  ?

## 2021-11-02 NOTE — Discharge Instructions (Signed)
It was a pleasure taking care of you today! ? ?Your ultrasound was negative for blood clot in the leg.  Continue taking your medications as previously prescribed by your rheumatologist, [name].  Call your rheumatologist today and set up a follow-up appointment regarding today's ED visit.  Return to the emergency department if you are having increasing/worsening warmth, redness, swelling, inability to move knee, worsening symptoms. ?

## 2021-11-03 NOTE — Progress Notes (Signed)
Office Visit Note  Patient: Brad [name]             Date of Birth: [birth year]           MRN: [id number]             PCP: Brad Pier, MD Referring: Brad Pier, MD Visit Date: 11/06/2021   Subjective:  Medication Management (Discuss medications)   History of Present Illness: Brad [name] is a 66 y.o. male here for follow up for chronic tophaceous gout of multiple areas.  Laboratory tests her last visit showed significant changes with hemoglobin decreased to 7.9 and estimated GFR decreased.  He was seen at the emergency department 4 days ago due to worsening pain and left leg swelling lower extremity ultrasound study negative for blood clot.  Joint pains have been somewhat well controlled on the 10 mg prednisone and use of 5 mg Norco 1-2 times daily.  Left leg remains severely swollen he has been unable to work a meaningful amount of hours due to the symptoms.  Previous HPI 10/30/2021 Brad [name] is a 66 y.o. male here for follow up for chronic tophaceous gout of multiple areas. He has had almost constant flare ups with joint pain since our last visit he improves while taking prednisone but symptoms return within days. He has new tophaceous deposits including in his right shoulder. He had some drainage form nodules on right middle finger and elbow and treated this with peroxide and has some residual hyperpigmentation in those areas.   Previous HPI 07/22/2021 Brad [name] is a 66 y.o. male here for follow up for his gout due to new exacerbation symptoms started last Thursday with severe pain and swelling in the right knee.  He has been unable to walk unassisted requiring crutches or wheelchair.  We spoke on the phone called in a prescription for steroid taper took a Medrol Dosepak.  He felt the medicine was effective on the first day but symptoms quickly returned to severe pain and swelling on the steroid he has 1 day remaining currently cannot work due to pain.    Previous HPI 06/02/21 Brad [name] is a 66 y.o. 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 Still has mild pain and stiffness. He has not noticed any problem with allopurinol increase.   Previous HPI 04/28/21 Brad [name] is a 66 y.o. 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 He is currently taking allopurinol 50 mg PO daily after recent start and was prescribed colchicine 0.3 mg daily dose.  He did not start the colchicine due to excessive cost. At the ED evaluation he was prescribed prednisone 30 mg daily taken for the past 5 days and the tramadol as needed. He has felt partial improvement so far, back to about the starting amount of hand swelling when this episode began. His right wrist is the most painful area.   Review of Systems  Constitutional:  Negative for fatigue.  HENT:  Positive for mouth dryness.   Eyes:  Negative for dryness.  Respiratory:  Negative for shortness of breath.   Cardiovascular:  Positive for swelling in legs/feet.  Gastrointestinal:  Negative for constipation.  Endocrine: Positive for cold intolerance, excessive  thirst and increased urination.  Genitourinary:  Negative for difficulty urinating.  Musculoskeletal:  Positive for joint swelling and morning stiffness.  Skin:  Negative for rash.  Allergic/Immunologic: Negative for susceptible to infections.  Neurological:  Negative for weakness.  Hematological:  Negative for bruising/bleeding tendency.  Psychiatric/Behavioral:  Positive for sleep disturbance.    PMFS History:  Patient Active Problem List   Diagnosis Date Noted   Paroxysmal atrial fibrillation ([place]) 04/02/2021   Chronic kidney disease, stage IV (severe) ([place]) 04/02/2021    Vitamin B 12 deficiency 01/31/2021   Chronic tophaceous gout 01/27/2021   Tobacco dependence 01/27/2021   Anemia, chronic disease 01/27/2021   Essential hypertension 11/21/2016    Past Medical History:  Diagnosis Date   Gout    Hypertension     Family History  Problem Relation Age of Onset   Diabetes Mother    History reviewed. No pertinent surgical history. Social History   Social History Narrative   Not on file    There is no immunization history on file for this patient.   Objective: Vital Signs: BP (!) 146/70 (BP Location: Right Arm, Patient Position: Sitting, Cuff Size: Normal)   Pulse 91   Resp 16   Ht 6' (1.829 m)   BMI 19.67 kg/m    Physical Exam Constitutional:      Comments: In wheelchair  Cardiovascular:     Rate and Rhythm: Normal rate and regular rhythm.  Pulmonary:     Effort: Pulmonary effort is normal.     Breath sounds: Normal breath sounds.  Skin:    Comments: Diffuse left lower leg swelling with pitting edema no erythema warmth or focal lesions  Neurological:     Mental Status: He is alert.     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  Imaging: VAS [place] LOWER EXTREMITY VENOUS (DVT) (7a-7p)  Result Date: 11/02/2021  Lower Venous DVT Study Patient Name:  Brad [name]  Date of Exam:   11/02/2021 Medical Rec #: [id number]       Accession #:    [id number] Date of Birth: August 14, 1955      Patient Gender: M Patient Age:   45 years Exam Location:  [facility] Procedure:      VAS [place] LOWER EXTREMITY VENOUS (DVT) Referring Phys: Brad [name]  --------------------------------------------------------------------------------  Indications: Pain.  Risk Factors: None identified. Limitations: Poor ultrasound/tissue interface and patient positioning, patient immobility, patient pain tolerance. Comparison Study: No prior studies. Performing Technologist: Oliver Hum RVT  Examination Guidelines: A complete evaluation includes B-mode imaging, spectral Doppler, color Doppler, and power Doppler as needed of all accessible portions of each vessel. Bilateral testing is considered an integral part of a complete examination. Limited examinations for reoccurring indications may be performed as noted. The reflux portion of the exam is performed with the patient in reverse Trendelenburg.  +-----+---------------+---------+-----------+----------+--------------+ RIGHTCompressibilityPhasicitySpontaneityPropertiesThrombus Aging +-----+---------------+---------+-----------+----------+--------------+ CFV  Full           Yes      Yes                                 +-----+---------------+---------+-----------+----------+--------------+   +---------+---------------+---------+-----------+----------+--------------+  LEFT     CompressibilityPhasicitySpontaneityPropertiesThrombus Aging +---------+---------------+---------+-----------+----------+--------------+ CFV      Full           Yes      Yes                                 +---------+---------------+---------+-----------+----------+--------------+ SFJ      Full                                                        +---------+---------------+---------+-----------+----------+--------------+ FV Prox  Full                                                        +---------+---------------+---------+-----------+----------+--------------+ FV Mid   Full                                                        +---------+---------------+---------+-----------+----------+--------------+ FV  DistalFull                                                        +---------+---------------+---------+-----------+----------+--------------+ PFV      Full                                                        +---------+---------------+---------+-----------+----------+--------------+ POP      Full           Yes      Yes                                 +---------+---------------+---------+-----------+----------+--------------+ PTV      Full                                                        +---------+---------------+---------+-----------+----------+--------------+ PERO     Full                                                        +---------+---------------+---------+-----------+----------+--------------+     Summary: RIGHT: - No evidence of common femoral vein obstruction.  LEFT: - There is no evidence of deep vein thrombosis in the lower extremity. However, portions of this examination were limited- see technologist comments above.  - No cystic structure found in  the popliteal fossa.  *See table(s) above for measurements and observations. Electronically signed by Deitra Mayo MD on 11/02/2021 at 4:20:57 PM.    Final     Recent Labs: Lab Results  Component Value Date   WBC 15.1 (H) 10/30/2021   HGB 7.9 (L) 10/30/2021   PLT 883 (H) 10/30/2021   NA 132 (L) 10/30/2021   K 4.7 10/30/2021   CL 102 10/30/2021   CO2 19 (L) 10/30/2021   GLUCOSE 144 (H) 10/30/2021   BUN 48 (H) 10/30/2021   CREATININE 3.18 (H) 10/30/2021   BILITOT 0.2 10/30/2021   ALKPHOS 53 06/04/2021   AST 9 (L) 10/30/2021   ALT 7 (L) 10/30/2021   PROT 6.7 10/30/2021   ALBUMIN 3.7 06/04/2021   CALCIUM 8.5 (L) 10/30/2021   GFRAA >60 11/25/2016    Speciality Comments: No specialty comments available.  Procedures:  No procedures performed Allergies: Patient has no known allergies.   Assessment / Plan:     Visit Diagnoses: Chronic tophaceous gout - Uncontrolled he has extensive  tophaceous deposits and pretty much constant inflammation whenever he is off prednisone.   Too early to determine response to Uloric treatment plan to continue 40 mg daily.  We will continue on the low-dose prednisone 10 mg daily to control symptoms as well.  Signing pain contract for continuing the low-dose Norco as needed until he makes some further progress with inflammation.  Chronic kidney disease, stage IV (severe) ([place]) - Plan: Renal Function Panel, Ambulatory referral to Nephrology  Concerning recent labs with worsening estimated GFR increasing leg edema although the asymmetric distribution suggests more likely localized problem.  We will repeat renal function panel today.  I deftly think he needs to get reestablished with nephrology care.  If function also continues worsening will consider urinalysis ruling out urate crystal nephropathy.  Anemia, chronic disease - Plan: CBC with Differential/Platelet  No reported visible blood loss with GI or urine.  Suspect related to anemia of chronic disease with ongoing inflammation and with chronic renal disease.  Repeating CBC today.  Orders: Orders Placed This Encounter  Procedures   Renal Function Panel   CBC with Differential/Platelet   Ambulatory referral to Nephrology   No orders of the defined types were placed in this encounter.    Follow-Up Instructions: No follow-ups on file.   Collier Salina, MD  Note - This record has been created using [employer].  Chart creation errors have been sought, but may not always  have been located. Such creation errors do not reflect on  the standard of medical care.

## 2021-11-06 ENCOUNTER — Ambulatory Visit (INDEPENDENT_AMBULATORY_CARE_PROVIDER_SITE_OTHER): Payer: No Typology Code available for payment source | Admitting: Internal Medicine

## 2021-11-06 ENCOUNTER — Encounter: Payer: Self-pay | Admitting: Internal Medicine

## 2021-11-06 VITALS — BP 146/70 | HR 91 | Resp 16 | Ht 72.0 in

## 2021-11-06 DIAGNOSIS — Z79899 Other long term (current) drug therapy: Secondary | ICD-10-CM | POA: Diagnosis not present

## 2021-11-06 DIAGNOSIS — N184 Chronic kidney disease, stage 4 (severe): Secondary | ICD-10-CM | POA: Diagnosis not present

## 2021-11-06 DIAGNOSIS — M1A9XX1 Chronic gout, unspecified, with tophus (tophi): Secondary | ICD-10-CM | POA: Diagnosis not present

## 2021-11-06 DIAGNOSIS — D638 Anemia in other chronic diseases classified elsewhere: Secondary | ICD-10-CM

## 2021-11-08 NOTE — Progress Notes (Signed)
Lab results look like blood count and kidney function are stable. He has a slightly high potassium and low bicarbonate these can be serious electrolyte problems if they get worse. We will need to monitor this when we follow up and he needs to get in with nephrology.

## 2021-11-09 ENCOUNTER — Telehealth: Payer: Self-pay

## 2021-11-09 ENCOUNTER — Telehealth: Payer: Self-pay | Admitting: Internal Medicine

## 2021-11-09 DIAGNOSIS — N184 Chronic kidney disease, stage 4 (severe): Secondary | ICD-10-CM

## 2021-11-09 LAB — SPECIMEN COMPROMISED

## 2021-11-09 LAB — CBC WITH DIFFERENTIAL/PLATELET
Absolute Monocytes: 465 cells/uL (ref 200–950)
Basophils Absolute: 40 cells/uL (ref 0–200)
Basophils Relative: 0.2 %
Eosinophils Absolute: 0 cells/uL — ABNORMAL LOW (ref 15–500)
Eosinophils Relative: 0 %
HCT: 25.8 % — ABNORMAL LOW (ref 38.5–50.0)
Hemoglobin: 8.5 g/dL — ABNORMAL LOW (ref 13.2–17.1)
Lymphs Abs: 1050 cells/uL (ref 850–3900)
MCH: 28.5 pg (ref 27.0–33.0)
MCHC: 32.9 g/dL (ref 32.0–36.0)
MCV: 86.6 fL (ref 80.0–100.0)
MPV: 9.3 fL (ref 7.5–12.5)
Monocytes Relative: 2.3 %
Neutro Abs: 18645 cells/uL — ABNORMAL HIGH (ref 1500–7800)
Neutrophils Relative %: 92.3 %
Platelets: 1112 10*3/uL — ABNORMAL HIGH (ref 140–400)
RBC: 2.98 10*6/uL — ABNORMAL LOW (ref 4.20–5.80)
RDW: 17.5 % — ABNORMAL HIGH (ref 11.0–15.0)
Total Lymphocyte: 5.2 %
WBC: 20.2 10*3/uL — ABNORMAL HIGH (ref 3.8–10.8)

## 2021-11-09 LAB — RENAL FUNCTION PANEL
Albumin: 3.1 g/dL — ABNORMAL LOW (ref 3.6–5.1)
BUN/Creatinine Ratio: 21 (calc) (ref 6–22)
BUN: 65 mg/dL — ABNORMAL HIGH (ref 7–25)
CO2: 15 mmol/L — ABNORMAL LOW (ref 20–32)
Calcium: 9.2 mg/dL (ref 8.6–10.3)
Chloride: 98 mmol/L (ref 98–110)
Creat: 3.1 mg/dL — ABNORMAL HIGH (ref 0.70–1.35)
Glucose, Bld: 190 mg/dL — ABNORMAL HIGH (ref 65–99)
Phosphorus: 6.2 mg/dL — ABNORMAL HIGH (ref 2.5–4.5)
Potassium: 5.7 mmol/L — ABNORMAL HIGH (ref 3.5–5.3)
Sodium: 133 mmol/L — ABNORMAL LOW (ref 135–146)

## 2021-11-09 MED ORDER — LOKELMA 5 G PO PACK: 5.0000 g | PACK | Freq: Every day | ORAL | 0 refills | Status: AC

## 2021-11-09 NOTE — Telephone Encounter (Signed)
PA FOR [facility] APPROVED THROUGH 11/10/22. PHARMACY NOTIFIED BUT STATES THAT RX WAS SENT FOR 1 PACKET?  CAN THE SCRIPT BE RESENT FOR AN APPROPRIATE QUANTITIY?  BOX COMES WITH 30 PACKETS

## 2021-11-09 NOTE — Telephone Encounter (Signed)
Unable to reach. Mailbox is full unable to leave voicemail.

## 2021-11-09 NOTE — Telephone Encounter (Signed)
Dr. [name] only wanted him to complete one dose d/t the degree of his hyperkalemia. Is it possible to fill for one packet? Of note, we will have to change therapy if it isn't possible to fill for 1 dose.

## 2021-11-09 NOTE — Telephone Encounter (Signed)
Patient informed Dr. [name] requested an OV for f/u lab results from Rheumatology.   Patient states his legs are very swollen and painful from gout. He needs assistance being carried outside of his apartment. He will be able to come to appointments on Fridays when his brothers are off. They  assist of him with getting around.  Aware of note from Dr. [name]- *d/c Lisinopril *pick up [facility] from pharmacy.   Patient verbalized understanding.

## 2021-11-09 NOTE — Telephone Encounter (Signed)
-----   Message from Collier Salina, MD sent at 11/08/2021  9:23 PM EDT ----- Lab results look like blood count and kidney function are stable. He has a slightly high potassium and low bicarbonate these can be serious electrolyte problems if they get worse. We will need to monitor this when we follow up and he needs to get in  with nephrology.

## 2021-11-10 ENCOUNTER — Other Ambulatory Visit: Payer: Self-pay

## 2021-11-10 ENCOUNTER — Telehealth: Payer: Self-pay

## 2021-11-10 NOTE — Telephone Encounter (Signed)
CONFIRMED LOKELMA IS INTENDED 1 PACKET ONLY, CALLED PHARMACY AND THEY WERE ABLE TO FILL

## 2021-11-11 ENCOUNTER — Other Ambulatory Visit: Payer: Self-pay | Admitting: Internal Medicine

## 2021-11-11 DIAGNOSIS — N184 Chronic kidney disease, stage 4 (severe): Secondary | ICD-10-CM

## 2021-11-11 NOTE — Telephone Encounter (Signed)
Pt called requesting this refill today, please advise

## 2021-11-12 ENCOUNTER — Ambulatory Visit: Payer: No Typology Code available for payment source | Attending: Internal Medicine | Admitting: Internal Medicine

## 2021-11-12 ENCOUNTER — Telehealth: Payer: Self-pay | Admitting: *Deleted

## 2021-11-12 DIAGNOSIS — I480 Paroxysmal atrial fibrillation: Secondary | ICD-10-CM

## 2021-11-12 DIAGNOSIS — R600 Localized edema: Secondary | ICD-10-CM

## 2021-11-12 DIAGNOSIS — M1A9XX1 Chronic gout, unspecified, with tophus (tophi): Secondary | ICD-10-CM

## 2021-11-12 DIAGNOSIS — D638 Anemia in other chronic diseases classified elsewhere: Secondary | ICD-10-CM

## 2021-11-12 DIAGNOSIS — I129 Hypertensive chronic kidney disease with stage 1 through stage 4 chronic kidney disease, or unspecified chronic kidney disease: Secondary | ICD-10-CM

## 2021-11-12 DIAGNOSIS — N184 Chronic kidney disease, stage 4 (severe): Secondary | ICD-10-CM

## 2021-11-12 DIAGNOSIS — R739 Hyperglycemia, unspecified: Secondary | ICD-10-CM

## 2021-11-12 DIAGNOSIS — E875 Hyperkalemia: Secondary | ICD-10-CM | POA: Diagnosis not present

## 2021-11-12 DIAGNOSIS — D72829 Elevated white blood cell count, unspecified: Secondary | ICD-10-CM | POA: Diagnosis not present

## 2021-11-12 DIAGNOSIS — D75839 Thrombocytosis, unspecified: Secondary | ICD-10-CM

## 2021-11-12 DIAGNOSIS — T380X5A Adverse effect of glucocorticoids and synthetic analogues, initial encounter: Secondary | ICD-10-CM

## 2021-11-12 NOTE — Progress Notes (Signed)
Patient ID: Brad [name], male   DOB: 1956-01-06, 66 y.o.   MRN: [id number] Virtual Visit via Telephone Note  I connected with Brad [name] on 11/12/2021 at 8:16 a.m by telephone and verified that I am speaking with the correct person using two identifiers  Location: Patient: home Provider: office  Participants: Myself Patient   I discussed the limitations, risks, security and privacy concerns of performing an evaluation and management service by telephone and the availability of in person appointments. I also discussed with the patient that there may be a patient responsible charge related to this service. The patient expressed understanding and agreed to proceed.   History of Present Illness: Pt with hx of HTN, gout, tob dep, chronic anemia, CKD 4, B12 def, PAF.  Last seen 01/2021.   Pt reports he did not know he was suppose to f/u with me and was not called for an appointment.  I requested this appointment today based recent lab results that were sent to me by Dr. Benjamine [name].  I wanted an in person visit but patient states that due to his gout and acute issue with his right leg he is unable to get out of the house without the assistance of his brothers.  They are only off on Fridays.  I am off this coming Friday so we decided to do it as a telephone visit.  CKD/Hyperkalemia:  referred to nephrology since last visit.  His insurance does not cover nephrology; told he will have to pay out of pocket by [place].   Missed 2 visits with them - Missed first time because he was waiting for them to send him a packet in the mail informing him of a financial plan with them and 2nd time due to transportation issues.  They have declined to reschedule him because of this.  Pt reports that Dr. Benjamine [name] had planned to call [place] to see if they would reconsider. -recent BMP revealed Creat 3, GFR 21.  01/2021 levels were creatinine was 2.3 with GFR of 31. Recent chemistry showed potassium  of 5.7, CO2 15, phosphorus 6.2. He was on Lisinopril for BP last year.  No longer taking that.  Currently on Norvasc 5 mg daily.  BP readings on review of his flow sheet shows elev BP on most visit in the health system I called in a dose of Lokelma 3 days ago.  His pharmacy told him it would be in today.  Plans to have brother pick it up. -Has chronic anemia that has worsened recently.  No blood in the stools or black stools, no bruising or bleeding.  No dizziness.  Leukocytosis/thrombocytosis: Had seen Brad [name] twice last year for this.  Assessed to be due to chronic inflammation from gout.  Improved with ongoing treatment of gout.  However recently numbers have increased again with WBC of 20,000/neutrophil 18,645 and plt count 1112.  Currently on Uloric and prednisone for gout. -Found to have B12 deficiency.  Currently taking B12 supplement daily.  Chronic tophaceous gout: Being followed by rheumatologist Dr. Benjamine [name].  Currently on Uloric and a prednisone taper.  Patient states that he has been on prednisone intermittently for at least the past 2 months.  Recent blood sugar on chemistry was 190 likely due to ongoing prednisone use. -Complains of swelling in the left leg from the knee down.  Reports soreness to touch on the knee and ankle.  Difficulty sleeping at night due to pain.  Pain in the emergency room for  the same 11/02/2021.  Doppler ultrasound negative for DVT of the leg.  He states Dr. Benjamine [name] thinks it is due to gout.  Currently on prednisone taper but still having a lot of pain.  Requesting something for pain.  I note that Dr. Benjamine [name] has been giving Norco for him to use as needed.  He is currently out of it.  He has not been able to work for the past 3 weeks.  Thinking about applying for disability but he is holding off on doing so because he really needs to work.  PAF: Referred to cardiology Brad [name] last year for atrial fibrillation.  When he saw him, he was back in normal rhythm. Echo revealed  nl function and structure.   Eliquis started.  Patient reports taking and tolerating the medication.  No bruising or bleeding.   Current Outpatient Medications  Medication Instructions   amLODipine (NORVASC) 5 mg, Oral, Daily   apixaban (ELIQUIS) 5 mg, Oral, 2 times daily   [employer] VITAMIN B12 1000 MCG tablet TAKE 1 TABLET BY MOUTH EVERY DAY   febuxostat (ULORIC) 40 mg, Oral, Daily   LISINOPRIL PO Lisinopril   Lokelma 5 g, Oral, Daily   predniSONE (DELTASONE) 10 MG tablet Take by mouth once daily 6, 5, 4, 3, 2, then 1 tablet daily until follow up      Observations/Objective: Results for orders placed or performed in visit on 11/06/21  Renal Function Panel  Result Value Ref Range   Glucose, Bld 190 (H) 65 - 99 mg/dL   BUN 65 (H) 7 - 25 mg/dL   Creat 3.10 (H) 0.70 - 1.35 mg/dL   BUN/Creatinine Ratio 21 6 - 22 (calc)   Sodium 133 (L) 135 - 146 mmol/L   Potassium 5.7 (H) 3.5 - 5.3 mmol/L   Chloride 98 98 - 110 mmol/L   CO2 15 (L) 20 - 32 mmol/L   Calcium 9.2 8.6 - 10.3 mg/dL   Phosphorus 6.2 (H) 2.5 - 4.5 mg/dL   Albumin 3.1 (L) 3.6 - 5.1 g/dL  CBC with Differential/Platelet  Result Value Ref Range   WBC 20.2 (H) 3.8 - 10.8 Thousand/uL   RBC 2.98 (L) 4.20 - 5.80 Million/uL   Hemoglobin 8.5 (L) 13.2 - 17.1 g/dL   HCT 25.8 (L) 38.5 - 50.0 %   MCV 86.6 80.0 - 100.0 fL   MCH 28.5 27.0 - 33.0 pg   MCHC 32.9 32.0 - 36.0 g/dL   RDW 17.5 (H) 11.0 - 15.0 %   Platelets 1,112 (H) 140 - 400 Thousand/uL   MPV 9.3 7.5 - 12.5 fL   Neutro Abs 18,645 (H) 1,500 - 7,800 cells/uL   Lymphs Abs 1,050 850 - 3,900 cells/uL   Absolute Monocytes 465 200 - 950 cells/uL   Eosinophils Absolute 0 (L) 15 - 500 cells/uL   Basophils Absolute 40 0 - 200 cells/uL   Neutrophils Relative % 92.3 %   Total Lymphocyte 5.2 %   Monocytes Relative 2.3 %   Eosinophils Relative 0.0 %   Basophils Relative 0.2 %   Smear Review    SPECIMEN COMPROMISED  Result Value Ref Range   Specimen Integrity     Lab Results   Component Value Date   IRON 107 06/04/2021   TIBC 291 06/04/2021   FERRITIN 159 06/04/2021     Assessment and Plan: 1. Hypertensive kidney disease with stage 4 chronic kidney disease (HCC) BP not at goal. Increase Norvasc to 7.5 mg daily.  Try to limit  salt in the foods. Kidney function has worsened.  Underscore the importance of him getting in with a nephrologist.  Advised that he call his insurance and find out what nephrology group in the area is in their network and let me know so that we can direct the referral to that particular group. - sodium bicarbonate 325 MG tablet; Take 1 tablet (325 mg total) by mouth 2 (two) times daily.  Dispense: 60 tablet; Refill: 2 - amLODipine (NORVASC) 5 MG tablet; Take 1.5 tablets (7.5 mg total) by mouth daily.  Dispense: 135 tablet; Refill: 3 - Basic Metabolic Panel - Microalbumin / creatinine urine ratio  2. Hyperkalemia Continue to stay off lisinopril.  He was not on it at the time of recent chemistry that showed potassium level of 5.7. Advise to avoid potassium rich foods like bananas, oranges, orange juice. He will pick up the [facility] today and take as prescribed.  Return to the lab next week for repeat potassium level check.  I suspect he may need to be maintained on Lokelma several days a week.  3. Anemia, chronic disease Associated with CKD.  We will continue to monitor.  4. Leukocytosis, unspecified type 5. Thrombocytosis Most likely still due to gout and prednisone.  However we will get him back with Brad [name] given the significant increase in both white blood cell count and platelet count since last seen by him. - Ambulatory referral to Hematology / Oncology  6. PAF (paroxysmal atrial fibrillation) (HCC) -stable on Eliquis  7. Leg edema, left -would need in-person eval. Pt states Dr. Benjamine [name] thinks it is due to gout  8. Steroid-induced hyperglycemia - Hemoglobin A1c; Future   Follow Up Instructions: 2 -3wks   I discussed the  assessment and treatment plan with the patient. The patient was provided an opportunity to ask questions and all were answered. The patient agreed with the plan and demonstrated an understanding of the instructions.   The patient was advised to call back or seek an in-person evaluation if the symptoms worsen or if the condition fails to improve as anticipated.  I  Spent 28 minutes on this telephone encounter  This note has been created with [employer], quantity. Any transcriptional errors are unintentional.  Karle Plumber, MD

## 2021-11-12 NOTE — Telephone Encounter (Signed)
Patient contacted the office stating he will be out of Prednisone tomorrow. He would like to know what he is suppose to do from here. Patient states his leg is still swollen and he is still in pain. Patient states he is still unable to work or go anywhere. He states he has to stay in bed. Please advise.

## 2021-11-12 NOTE — Progress Notes (Incomplete)
Patient ID: Brad [name], male   DOB: 1955/08/06, 66 y.o.   MRN: [id number] Virtual Visit via Telephone Note  I connected with Brad [name] on 11/12/2021 at 8:16 a.m by telephone and verified that I am speaking with the correct person using two identifiers  Location: Patient: home Provider: office  Participants: Myself Patient   I discussed the limitations, risks, security and privacy concerns of performing an evaluation and management service by telephone and the availability of in person appointments. I also discussed with the patient that there may be a patient responsible charge related to this service. The patient expressed understanding and agreed to proceed.   History of Present Illness: Pt with hx of HTN, gout, tob dep, chronic anemia, CKD 4, B12 def, PAF.  Last seen 01/2021.   Pt reports he did not know he was suppose to f/u with me and was not called for an appointment.  I requested this appointment today based recent lab results that were sent to me by [name].  I wanted an in person visit but patient states that due to his gout and acute issue with his right leg he is unable to get out of the house without the assistance of his brothers.  They are only off on Fridays.  I am off this coming Friday so we decided to do it as a telephone visit.  CKD/Hyperkalemia:  referred to nephrology since last visit.  His insurance does not cover nephrology; told he will have to pay out of pocket by [place].   Missed 2 visits with them - Missed first time because he was waiting for them to send him a packet in the mail informing him of a financial plan with them and 2nd time due to transportation issues.  They have declined to reschedule him because of this.  Pt reports that [name] had planned to call [facility] to see if they would reconsider. -recent BMP revealed Creat 3, GFR 21.  01/2021 levels were creatinine was 2.3 with GFR of 31. Recent chemistry showed potassium  of 5.7, CO2 15, phosphorus 6.2. He was on Lisinopril for BP last year.  No longer taking that.  Currently on Norvasc 5 mg daily.  BP readings on review of his flow sheet shows elev BP on most visit in the health system I called in a dose of Lokelma 3 days ago.  His pharmacy told him it would be in today.  Plans to have brother pick it up. -Has chronic anemia that has worsened recently.  No blood in the stools or black stools, no bruising or bleeding.  No dizziness.  Leukocytosis/thrombocytosis: Had seen [name] twice last year for this.  Assessed to be due to chronic inflammation from gout.  Improved with ongoing treatment of gout.  However recently numbers have increased again with WBC of 20,000/neutrophil 18,645 and plt count 1112.  Currently on Uloric and prednisone for gout. -Found to have B12 deficiency.  Currently taking B12 supplement daily.  Chronic tophaceous gout: Being followed by rheumatologist [name].  Currently on Uloric and a prednisone taper.  Patient states that he has been on prednisone intermittently for at least the past 2 months.  Recent blood sugar on chemistry was 190 likely due to ongoing prednisone use. -Complains of swelling in the left leg from the knee down.  Reports soreness to touch on the knee and ankle.  Difficulty sleeping at night due to pain.  Pain in the emergency room for  the same 11/02/2021.  Doppler ultrasound negative for DVT of the leg.  He states [name] thinks it is due to gout.  Currently on prednisone taper but still having a lot of pain.  Requesting something for pain.  I note that [name] has been giving Norco for him to use as needed.  He is currently out of it.  He has not been able to work for the past 3 weeks.  Thinking about applying for disability but he is holding off on doing so because he really needs to work.  PAF: Referred to cardiology [name] last year for atrial fibrillation.  When he saw him, he was back in normal rhythm. Echo revealed  nl function and structure.   Eliquis started.  Patient reports taking and tolerating the medication.  No bruising or bleeding.   Current Outpatient Medications  Medication Instructions  . amLODipine (NORVASC) 5 mg, Oral, Daily  . apixaban (ELIQUIS) 5 mg, Oral, 2 times daily  . [employer] VITAMIN B12 1000 MCG tablet TAKE 1 TABLET BY MOUTH EVERY DAY  . febuxostat (ULORIC) 40 mg, Oral, Daily  . LISINOPRIL PO Lisinopril  . Lokelma 5 g, Oral, Daily  . predniSONE (DELTASONE) 10 MG tablet Take by mouth once daily 6, 5, 4, 3, 2, then 1 tablet daily until follow up      Observations/Objective: Results for orders placed or performed in visit on 11/06/21  Renal Function Panel  Result Value Ref Range   Glucose, Bld 190 (H) 65 - 99 mg/dL   BUN 65 (H) 7 - 25 mg/dL   Creat 3.10 (H) 0.70 - 1.35 mg/dL   BUN/Creatinine Ratio 21 6 - 22 (calc)   Sodium 133 (L) 135 - 146 mmol/L   Potassium 5.7 (H) 3.5 - 5.3 mmol/L   Chloride 98 98 - 110 mmol/L   CO2 15 (L) 20 - 32 mmol/L   Calcium 9.2 8.6 - 10.3 mg/dL   Phosphorus 6.2 (H) 2.5 - 4.5 mg/dL   Albumin 3.1 (L) 3.6 - 5.1 g/dL  CBC with Differential/Platelet  Result Value Ref Range   WBC 20.2 (H) 3.8 - 10.8 Thousand/uL   RBC 2.98 (L) 4.20 - 5.80 Million/uL   Hemoglobin 8.5 (L) 13.2 - 17.1 g/dL   HCT 25.8 (L) 38.5 - 50.0 %   MCV 86.6 80.0 - 100.0 fL   MCH 28.5 27.0 - 33.0 pg   MCHC 32.9 32.0 - 36.0 g/dL   RDW 17.5 (H) 11.0 - 15.0 %   Platelets 1,112 (H) 140 - 400 Thousand/uL   MPV 9.3 7.5 - 12.5 fL   Neutro Abs 18,645 (H) 1,500 - 7,800 cells/uL   Lymphs Abs 1,050 850 - 3,900 cells/uL   Absolute Monocytes 465 200 - 950 cells/uL   Eosinophils Absolute 0 (L) 15 - 500 cells/uL   Basophils Absolute 40 0 - 200 cells/uL   Neutrophils Relative % 92.3 %   Total Lymphocyte 5.2 %   Monocytes Relative 2.3 %   Eosinophils Relative 0.0 %   Basophils Relative 0.2 %   Smear Review    SPECIMEN COMPROMISED  Result Value Ref Range   Specimen Integrity     Lab  Results  Component Value Date   IRON 107 06/04/2021   TIBC 291 06/04/2021   FERRITIN 159 06/04/2021     Assessment and Plan:   Follow Up Instructions:    I discussed the assessment and treatment plan with the patient. The patient was provided an opportunity to  ask questions and all were answered. The patient agreed with the plan and demonstrated an understanding of the instructions.   The patient was advised to call back or seek an in-person evaluation if the symptoms worsen or if the condition fails to improve as anticipated.  I  Spent 28 minutes on this telephone encounter  This note has been created with Surveyor, quantity. Any transcriptional errors are unintentional.  Karle Plumber, MD

## 2021-11-13 ENCOUNTER — Telehealth: Payer: Self-pay | Admitting: Hematology and Oncology

## 2021-11-13 DIAGNOSIS — D72829 Elevated white blood cell count, unspecified: Secondary | ICD-10-CM | POA: Insufficient documentation

## 2021-11-13 DIAGNOSIS — T380X5A Adverse effect of glucocorticoids and synthetic analogues, initial encounter: Secondary | ICD-10-CM | POA: Insufficient documentation

## 2021-11-13 DIAGNOSIS — N184 Chronic kidney disease, stage 4 (severe): Secondary | ICD-10-CM | POA: Insufficient documentation

## 2021-11-13 DIAGNOSIS — D75839 Thrombocytosis, unspecified: Secondary | ICD-10-CM | POA: Insufficient documentation

## 2021-11-13 DIAGNOSIS — R739 Hyperglycemia, unspecified: Secondary | ICD-10-CM | POA: Insufficient documentation

## 2021-11-13 DIAGNOSIS — I129 Hypertensive chronic kidney disease with stage 1 through stage 4 chronic kidney disease, or unspecified chronic kidney disease: Secondary | ICD-10-CM | POA: Insufficient documentation

## 2021-11-13 MED ORDER — SODIUM BICARBONATE 325 MG PO TABS: 325.0000 mg | ORAL_TABLET | Freq: Two times a day (BID) | ORAL | 2 refills | Status: DC

## 2021-11-13 MED ORDER — PREDNISONE 10 MG PO TABS: ORAL_TABLET | ORAL | 0 refills | Status: DC

## 2021-11-13 MED ORDER — AMLODIPINE BESYLATE 5 MG PO TABS: 7.5000 mg | ORAL_TABLET | Freq: Every day | ORAL | 3 refills | Status: DC

## 2021-11-13 NOTE — Telephone Encounter (Signed)
I spoke with Brad [name] he is continuing to have pretty severe joint pain in multiple areas and left leg swelling was not decreased with recent prednisone taper.  He reports no significant benefit with as needed 5 mg Norco either.  Due to lack of response with intra-articular steroid injection I do not have a lot to recommend short-term for gout treatment besides continuing on low-dose corticosteroid.  I am somewhat concerned about the persistent edema in his legs that is not responsive to steroid treatment in combination with recent abnormal renal function labs.  He was prescribed medication for management of his metabolic acidosis and hyperkalemia from PCP Dr. [name].

## 2021-11-13 NOTE — Addendum Note (Signed)
Addended by: Collier Salina on: 11/13/2021 08:26 AM   Modules accepted: Orders

## 2021-11-13 NOTE — Telephone Encounter (Signed)
Scheduled appt per 5/26 referral. Established pt of [name]. Called pt, no answer and no vm. Mailed updated calendar to pt.

## 2021-11-17 ENCOUNTER — Emergency Department (HOSPITAL_COMMUNITY): Payer: PRIVATE HEALTH INSURANCE

## 2021-11-17 ENCOUNTER — Other Ambulatory Visit: Payer: Self-pay

## 2021-11-17 ENCOUNTER — Encounter (HOSPITAL_COMMUNITY): Payer: Self-pay

## 2021-11-17 ENCOUNTER — Inpatient Hospital Stay (HOSPITAL_COMMUNITY)
Admission: EM | Admit: 2021-11-17 | Discharge: 2021-11-20 | DRG: 553 | Disposition: A | Discharge status: Home / Self Care | Payer: PRIVATE HEALTH INSURANCE | Attending: Internal Medicine | Admitting: Internal Medicine

## 2021-11-17 ENCOUNTER — Telehealth: Payer: Self-pay | Admitting: Internal Medicine

## 2021-11-17 DIAGNOSIS — I129 Hypertensive chronic kidney disease with stage 1 through stage 4 chronic kidney disease, or unspecified chronic kidney disease: Secondary | ICD-10-CM | POA: Diagnosis present

## 2021-11-17 DIAGNOSIS — M79605 Pain in left leg: Secondary | ICD-10-CM

## 2021-11-17 DIAGNOSIS — Z79899 Other long term (current) drug therapy: Secondary | ICD-10-CM

## 2021-11-17 DIAGNOSIS — D638 Anemia in other chronic diseases classified elsewhere: Secondary | ICD-10-CM | POA: Diagnosis present

## 2021-11-17 DIAGNOSIS — M1A9XX1 Chronic gout, unspecified, with tophus (tophi): Secondary | ICD-10-CM

## 2021-11-17 DIAGNOSIS — N179 Acute kidney failure, unspecified: Secondary | ICD-10-CM | POA: Diagnosis present

## 2021-11-17 DIAGNOSIS — E43 Unspecified severe protein-calorie malnutrition: Secondary | ICD-10-CM | POA: Insufficient documentation

## 2021-11-17 DIAGNOSIS — F17210 Nicotine dependence, cigarettes, uncomplicated: Secondary | ICD-10-CM | POA: Diagnosis present

## 2021-11-17 DIAGNOSIS — Z7952 Long term (current) use of systemic steroids: Secondary | ICD-10-CM

## 2021-11-17 DIAGNOSIS — R600 Localized edema: Secondary | ICD-10-CM

## 2021-11-17 DIAGNOSIS — D631 Anemia in chronic kidney disease: Secondary | ICD-10-CM | POA: Diagnosis present

## 2021-11-17 DIAGNOSIS — Z6820 Body mass index (BMI) 20.0-20.9, adult: Secondary | ICD-10-CM

## 2021-11-17 DIAGNOSIS — Z7901 Long term (current) use of anticoagulants: Secondary | ICD-10-CM

## 2021-11-17 DIAGNOSIS — D72829 Elevated white blood cell count, unspecified: Secondary | ICD-10-CM | POA: Diagnosis present

## 2021-11-17 DIAGNOSIS — I10 Essential (primary) hypertension: Secondary | ICD-10-CM | POA: Diagnosis present

## 2021-11-17 DIAGNOSIS — M109 Gout, unspecified: Principal | ICD-10-CM | POA: Diagnosis present

## 2021-11-17 DIAGNOSIS — D75839 Thrombocytosis, unspecified: Secondary | ICD-10-CM | POA: Diagnosis present

## 2021-11-17 DIAGNOSIS — N184 Chronic kidney disease, stage 4 (severe): Secondary | ICD-10-CM

## 2021-11-17 DIAGNOSIS — M79606 Pain in leg, unspecified: Secondary | ICD-10-CM | POA: Diagnosis present

## 2021-11-17 DIAGNOSIS — I480 Paroxysmal atrial fibrillation: Secondary | ICD-10-CM | POA: Diagnosis present

## 2021-11-17 DIAGNOSIS — M25462 Effusion, left knee: Secondary | ICD-10-CM | POA: Diagnosis present

## 2021-11-17 LAB — CBC
HCT: 25.2 % — ABNORMAL LOW (ref 39.0–52.0)
Hemoglobin: 8.4 g/dL — ABNORMAL LOW (ref 13.0–17.0)
MCH: 28.5 pg (ref 26.0–34.0)
MCHC: 33.3 g/dL (ref 30.0–36.0)
MCV: 85.4 fL (ref 80.0–100.0)
Platelets: 794 10*3/uL — ABNORMAL HIGH (ref 150–400)
RBC: 2.95 MIL/uL — ABNORMAL LOW (ref 4.22–5.81)
RDW: 17.7 % — ABNORMAL HIGH (ref 11.5–15.5)
WBC: 25.5 10*3/uL — ABNORMAL HIGH (ref 4.0–10.5)
nRBC: 0 % (ref 0.0–0.2)

## 2021-11-17 LAB — BASIC METABOLIC PANEL
Anion gap: 11 (ref 5–15)
BUN: 39 mg/dL — ABNORMAL HIGH (ref 8–23)
CO2: 15 mmol/L — ABNORMAL LOW (ref 22–32)
Calcium: 8.7 mg/dL — ABNORMAL LOW (ref 8.9–10.3)
Chloride: 106 mmol/L (ref 98–111)
Creatinine, Ser: 2.32 mg/dL — ABNORMAL HIGH (ref 0.61–1.24)
GFR, Estimated: 31 mL/min — ABNORMAL LOW (ref >60)
Glucose, Bld: 188 mg/dL — ABNORMAL HIGH (ref 70–99)
Potassium: 4.2 mmol/L (ref 3.5–5.1)
Sodium: 132 mmol/L — ABNORMAL LOW (ref 135–145)

## 2021-11-17 MED ORDER — ONDANSETRON HCL 4 MG/2ML IJ SOLN
4.0000 mg | Freq: Once | INTRAMUSCULAR | Status: AC
  Administered 2021-11-18: 4 mg via INTRAVENOUS
  Filled 2021-11-17: qty 2

## 2021-11-17 MED ORDER — MORPHINE SULFATE (PF) 4 MG/ML IV SOLN
4.0000 mg | INTRAVENOUS | Status: DC | PRN
  Administered 2021-11-18 (×2): 4 mg via INTRAVENOUS
  Filled 2021-11-17 (×2): qty 1

## 2021-11-17 NOTE — ED Triage Notes (Signed)
Pt here from home via [facility] for increased pain and swelling in L leg and foot. Pt saw PCP after last ED visit and was placed on prednisone, which pt states he just started taking today. Hx gout and HTN. 142/82, 65HR, 20RR, 99% RA

## 2021-11-17 NOTE — Telephone Encounter (Signed)
Spoke with patient and advised per [name], He should probably go and get evaluated at the hospital if worsening by that much. I don't have much specific we can do here beyond treatments we already tried and his severity of leg swelling is concerning along with lab abnormalities in his kidney function. Patient expressed understanding.

## 2021-11-17 NOTE — ED Provider Triage Note (Signed)
Emergency Medicine Provider Triage Evaluation Note  Brad [name] , a 66 y.o. male  was evaluated in triage.  Pt complains of swelling of the left lower extremity.  Started in the left knee and moved downwards.  Hx of gout.  Saw his PCP yesterday who placed him on prednisone for gout exacerbation.  He started it today.  Per patient leg is significantly worsened overnight.  Unable to walk.  Left leg almost triple the size of the right.  Denies chest pain, shortness of breath, fevers, chills.  Still endorses some sensation of the left foot.  Hx of end-stage renal disease, soon to be set up for dialysis.  Review of Systems  Positive:  Negative: As above  Physical Exam  BP (!) 154/82   Pulse 98   Temp 98 F (36.7 C) (Oral)   Resp 18   SpO2 100%  Gen:   Awake, no distress   Resp:  Normal effort, CTAB MSK:   Moves extremities without difficulty with the exception of the left extremity Other:  Left lower extremity with significant edema, is almost 3 times the size of the right.  Skin appears to be peeling off of the left foot, moderate continuous weeping, pale discoloration, and cool temperature of the distal foot.  Able to wiggle toes, but with significantly reduced range of motion.  Pitting edema 4+ to the groin.  Unable to palpate or identify DP or PT pulses of the left lower extremity.  Medical Decision Making  Medically screening exam initiated at 8:57 PM.  Appropriate orders placed.  Brad [name] was informed that the remainder of the evaluation will be completed by another provider, this initial triage assessment does not replace that evaluation, and the importance of remaining in the ED until their evaluation is complete.  Labs, imaging ordered  Discussed patient with triage nurse and Dr. Billy Fischer.  Patient will be moved to the next available room.   Brad [name], [place] 46/50/35 2113

## 2021-11-17 NOTE — Telephone Encounter (Signed)
Patient states his keg is not doing any better. Patient states he is in pain an miserable. Patient states he is unable to walk except to go to the bathroom. Patient states it is from his foot all the way up. Patient states the swelling has doubled in size since the last visit. Patient states he is taking Prednisone. Patient states he has not been taking the Uloric. He states he felt like it was not working so he stopped it after one week. Patient advised that the Uloric takes time to get into his system. Patient advised he should take it everyday. Patient states he will resume taking it today. Patient questioned if he needs to be admitted the hospital. Advised patient if he is in that much pain he should have an evaluation at the emergency room and if they feel he needs to be admitted then they can admit him. Patient expressed understanding.

## 2021-11-17 NOTE — Telephone Encounter (Signed)
Attempted to contact the patient and no answer, unable to leave a message, voicemail is full.

## 2021-11-17 NOTE — Telephone Encounter (Signed)
Patient called requesting to speak with Dr. Marveen Reeks nurse regarding the chronic gout in his left leg.

## 2021-11-17 NOTE — Progress Notes (Signed)
Office Visit Note  Patient: Brad [name]             Date of Birth: 07/20/1955           MRN: [id number]             PCP: Ladell Pier, MD Referring: Ladell Pier, MD Visit Date: 11/27/2021   Subjective:  Follow-up (Improving)   History of Present Illness: Brad [name] is a 66 y.o. male here for follow up for chronic tophaceous gout of multiple areas. He was recently hospitalized due to severe edema of his left leg from the knee down with increased pain with any weight bearing. At the hospital CT scan demonstrated large effusion with some gas present consistent for severe inflammation. Knee aspiration yielded 30cc of turbid yellow fluid consistent with tophi and positive for extracellular and intracellular crystals with negative stain and culture. Since the hospital his pain is greatly improved, currently on prednisone taper at 30 mg/day and now on colchicine 0.3 mg. He developed a blistering lesion on the left foot from his edema and keeping dressing on this. He does not have any particular clinic or nursing care for this.  Previous HPI 11/06/2021 Brad [name] is a 66 y.o. male here for follow up for chronic tophaceous gout of multiple areas.  Laboratory tests her last visit showed significant changes with hemoglobin decreased to 7.9 and estimated GFR decreased.  He was seen at the emergency department 4 days ago due to worsening pain and left leg swelling lower extremity ultrasound study negative for blood clot.  Joint pains have been somewhat well controlled on the 10 mg prednisone and use of 5 mg Norco 1-2 times daily.  Left leg remains severely swollen he has been unable to work a meaningful amount of hours due to the symptoms.   Previous HPI 10/30/2021 Brad [name] is a 66 y.o. male here for follow up for chronic tophaceous gout of multiple areas. He has had almost constant flare ups with joint pain since our last visit he improves while taking prednisone but symptoms  return within days. He has new tophaceous deposits including in his right shoulder. He had some drainage form nodules on right middle finger and elbow and treated this with peroxide and has some residual hyperpigmentation in those areas.   Previous HPI 07/22/2021 Brad [name] is a 66 y.o. male here for follow up for his gout due to new exacerbation symptoms started last Thursday with severe pain and swelling in the right knee.  He has been unable to walk unassisted requiring crutches or wheelchair.  We spoke on the phone called in a prescription for steroid taper took a Medrol Dosepak.  He felt the medicine was effective on the first day but symptoms quickly returned to severe pain and swelling on the steroid he has 1 day remaining currently cannot work due to pain.   Previous HPI 06/02/21 Brad [name] is a 66 y.o. 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 Still has mild pain and stiffness. He has not noticed any problem with allopurinol increase.   Previous HPI 04/28/21 Brad [name] is a 66 y.o. 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 He is currently  taking allopurinol 50 mg PO daily after recent start and was prescribed colchicine 0.3 mg daily dose.  He did not start the colchicine due to excessive cost. At the ED evaluation he was prescribed prednisone 30 mg daily taken for the past 5 days and the tramadol as needed. He has felt partial improvement so far, back to about the starting amount of hand swelling when this episode began. His right wrist is the most painful area.   Review of Systems  Constitutional:  Positive for fatigue.  HENT:  Positive for mouth dryness.   Eyes:  Negative for dryness.  Respiratory:   Negative for shortness of breath.   Cardiovascular:  Positive for swelling in legs/feet.  Gastrointestinal:  Positive for constipation and diarrhea.  Endocrine: Positive for cold intolerance, excessive thirst and increased urination.  Genitourinary:  Negative for difficulty urinating.  Musculoskeletal:  Positive for gait problem, joint swelling and morning stiffness.  Skin:  Negative for rash.  Allergic/Immunologic: Negative for susceptible to infections.  Neurological:  Negative for numbness.  Hematological:  Negative for bruising/bleeding tendency.  Psychiatric/Behavioral:  Positive for sleep disturbance.     PMFS History:  Patient Active Problem List   Diagnosis Date Noted   Non-pressure chronic ulcer of other part of left foot limited to breakdown of skin ([place]) 11/27/2021   Leg pain 11/19/2021   Edema of left lower extremity 11/18/2021   Protein-calorie malnutrition, severe 11/18/2021   Thrombocytosis 11/13/2021   Leukocytosis 11/13/2021   Hypertensive kidney disease with stage 4 chronic kidney disease ([place]) 11/13/2021   Steroid-induced hyperglycemia 11/13/2021   Paroxysmal atrial fibrillation ([place]) 04/02/2021   Chronic kidney disease, stage IV (severe) ([place]) 04/02/2021   Vitamin B 12 deficiency 01/31/2021   Chronic tophaceous gout 01/27/2021   Tobacco dependence 01/27/2021   Anemia, chronic disease 01/27/2021   Essential hypertension 11/21/2016    Past Medical History:  Diagnosis Date   Gout    Hypertension    Kidney failure     Family History  Problem Relation Age of Onset   Diabetes Mother    History reviewed. No pertinent surgical history. Social History   Social History Narrative   Not on file    There is no immunization history on file for this patient.   Objective: Vital Signs: BP (!) 155/75 (BP Location: Left Arm, Patient Position: Sitting, Cuff Size: Normal)   Pulse 88   Resp 17   Ht 6' (1.829 m)   Wt 160 lb (72.6 kg)   BMI 21.70 kg/m     Physical Exam Cardiovascular:     Rate and Rhythm: Normal rate and regular rhythm.  Pulmonary:     Effort: Pulmonary effort is normal.     Breath sounds: Normal breath sounds.  Skin:    General: Skin is warm and dry.     Comments: Left foot wound with superficial skin injury healthy appearing tissue present, some clear some serous appearing fluid collection on wound dressing There is no ulceration, severe edema in the surrounding area but no obvious induration or fluctuance  Neurological:     Mental Status: He is alert.  Psychiatric:        Mood and Affect: Mood normal.      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  Imaging: CT KNEE LEFT WO CONTRAST  Result Date: 11/18/2021 CLINICAL DATA:  Knee pain. Aggressive feature on x-ray. Popliteal fossa mass noted on venous Doppler. EXAM: CT OF THE LEFT KNEE WITHOUT CONTRAST TECHNIQUE: Multidetector CT imaging of the left knee was performed according to the standard protocol. Multiplanar CT image reconstructions were also generated. RADIATION DOSE REDUCTION: This exam was performed according to the departmental dose-optimization program which includes automated exposure control, adjustment of the mA and/or kV according to patient size and/or use of iterative reconstruction technique. COMPARISON:  None Available. FINDINGS: Bones/Joint/Cartilage There is diffuse osteopenia. No evidence of acute fracture or dislocation. There is moderate size joint effusion with multiple calcified structures, likely representing loose bodies. There  are multiple small foci of gas in suprapatellar joint effusion concerning for infectious/inflammatory process. Geographic peripherally sclerotic lesion in the proximal tibia likely bone infarct. Ligaments Suboptimally assessed by CT. Muscles and Tendons There is dystrophic calcification about the insertion of the patellar tendon. There is also ossification anterior to the patella, likely sequela of chronic bursitis, which may be secondary to gouty arthritis. Soft tissues Subcutaneous soft tissue edema about posterior and medial aspect of the leg. IMPRESSION: 1.  No evidence of fracture or dislocation. 2. Large joint effusion with multiple foci of gas concerning for infectious/inflammatory process. Joint aspiration for further management would be helpful. Clinical correlation is suggested. 3. Multiple calcified bodies in the joint space, which may be loose bodies or sequela of chronic arthritis. 4. Prepatellar calcification as well as advanced enthesopathy of the patellar tendon, which may be secondary to inflammatory arthropathy including gouty arthritis. Electronically Signed   By: Keane Police D.O.   On: 11/18/2021 21:13   VAS [place] LOWER EXTREMITY VENOUS (DVT) (ONLY [facility] & [facility])  Result Date: 11/18/2021  Lower Venous DVT Study Patient Name:  Brad [name]  Date of Exam:   11/18/2021 Medical Rec #: [id number]       Accession #:    [id number] Date of Birth: [birth year]      Patient Gender: M Patient Age:   40 years Exam Location:  [facility] Procedure:      VAS [place] LOWER EXTREMITY VENOUS (DVT) Referring Phys: Wandra Feinstein RATH[place] --------------------------------------------------------------------------------  Indications: Left greater than right worsening edema.  Comparison Study: 11/02/2021- negative left lower extremity venous duplex Performing Technologist: Maudry Mayhew MHA, RDMS, RVT, RDCS  Examination Guidelines: A complete evaluation includes B-mode imaging, spectral Doppler, color Doppler, and power  Doppler as needed of all accessible portions of each vessel. Bilateral testing is considered an integral part of a complete examination. Limited examinations for reoccurring indications may be performed as noted. The reflux portion of the exam is performed with the patient in reverse Trendelenburg.  +-----+---------------+---------+-----------+----------+--------------+ RIGHTCompressibilityPhasicitySpontaneityPropertiesThrombus Aging +-----+---------------+---------+-----------+----------+--------------+ CFV  Full           Yes      Yes                                 +-----+---------------+---------+-----------+----------+--------------+   +---------+---------------+---------+-----------+----------+--------------+ LEFT     CompressibilityPhasicitySpontaneityPropertiesThrombus Aging +---------+---------------+---------+-----------+----------+--------------+ CFV      Full           Yes      Yes                                 +---------+---------------+---------+-----------+----------+--------------+  SFJ      Full                                                        +---------+---------------+---------+-----------+----------+--------------+ FV Prox  Full                                                        +---------+---------------+---------+-----------+----------+--------------+ FV Mid   Full                                                        +---------+---------------+---------+-----------+----------+--------------+ FV DistalFull                                                        +---------+---------------+---------+-----------+----------+--------------+ PFV      Full                                                        +---------+---------------+---------+-----------+----------+--------------+ POP      Full           Yes      Yes                                  +---------+---------------+---------+-----------+----------+--------------+ PTV      Full                                                        +---------+---------------+---------+-----------+----------+--------------+ PERO     Full                                                        +---------+---------------+---------+-----------+----------+--------------+     Summary: RIGHT: - No evidence of common femoral vein obstruction.  LEFT: - There is no evidence of deep vein thrombosis in the lower extremity.  - No cystic structure found in the popliteal fossa. - Heterogenous area of the left poplitea fossa with internal low-resistant vascularity, and poorly defined margins measuring 2.0 x 2.4 x 2.3 cm. Etiology is unknown; recommend further imaging as clinically indicated.  *See table(s) above for measurements and observations. Electronically signed by Harold Barban MD on 11/18/2021 at 7:55:20 PM.    Final    DG Foot Complete Left  Result Date: 11/17/2021 CLINICAL DATA:  Pain and swelling. EXAM:  LEFT FOOT - COMPLETE 3+ VIEW COMPARISON:  Left foot x-ray 03/20/2021 FINDINGS: There is severe soft tissue swelling over the dorsum of the foot and surrounding the ankle. No radiopaque foreign body. No acute fracture or dislocation. No cortical erosions identified. There is stable cystic degenerative change seen at the first interphalangeal joint. IMPRESSION: 1. Severe soft tissue swelling of the foot and ankle. 2. No acute bony abnormality. 3. Stable cystic degenerative change at the first interphalangeal joint. Electronically Signed   By: Ronney Asters M.D.   On: 11/17/2021 23:35   DG Ankle Complete Left  Result Date: 11/17/2021 CLINICAL DATA:  Pain and swelling. EXAM: LEFT ANKLE COMPLETE - 3+ VIEW COMPARISON:  None Available. FINDINGS: There is severe soft tissue swelling of the ankle. Rounded density projects from the skin surface measuring 10 x 24 mm along the lateral ankle. There is no radiopaque  foreign body. There is no evidence for cortical erosion or focal osseous lesion. No acute fracture or dislocation. Joint spaces are well maintained. IMPRESSION: 1. Severe soft tissue swelling of the ankle. 2. Lateral ankle skin lesion, indeterminate. 3. No acute bony abnormality. Electronically Signed   By: Ronney Asters M.D.   On: 11/17/2021 23:33   DG Knee Complete 4 Views Left  Result Date: 11/17/2021 CLINICAL DATA:  Left leg pain and swelling, history of gout EXAM: LEFT KNEE - COMPLETE 4+ VIEW COMPARISON:  03/20/2021 FINDINGS: Frontal, bilateral oblique, and lateral views of the left knee are obtained. There is diffuse soft tissue swelling. Large joint effusion is noted. 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 No acute fracture, subluxation, or dislocation. No destructive bony lesions. Mild medial compartmental joint space narrowing. IMPRESSION: 1. Diffuse soft tissue swelling. 2. Large joint effusion. 3. No acute or destructive bony lesions. 4. Osteopenia. Electronically Signed   By: Randa Ngo M.D.   On: 11/17/2021 20:08   VAS [place] LOWER EXTREMITY VENOUS (DVT) (7a-7p)  Result Date: 11/02/2021  Lower Venous DVT Study Patient Name:  Brad [name]  Date of Exam:   11/02/2021 Medical Rec #: [id number]       Accession #:    [id number] Date of Birth: [birth year]      Patient Gender: M Patient Age:   20 years Exam Location:  [facility] Procedure:      VAS [place] LOWER EXTREMITY VENOUS (DVT) Referring Phys: [place] --------------------------------------------------------------------------------  Indications: Pain.  Risk Factors: None identified. Limitations: Poor ultrasound/tissue interface and patient positioning, patient immobility, patient pain tolerance. Comparison Study: No prior studies. Performing Technologist: Oliver Hum RVT  Examination Guidelines: A complete evaluation includes B-mode imaging, spectral Doppler, color Doppler, and power Doppler as  needed of all accessible portions of each vessel. Bilateral testing is considered an integral part of a complete examination. Limited examinations for reoccurring indications may be performed as noted. The reflux portion of the exam is performed with the patient in reverse Trendelenburg.  +-----+---------------+---------+-----------+----------+--------------+ RIGHTCompressibilityPhasicitySpontaneityPropertiesThrombus Aging +-----+---------------+---------+-----------+----------+--------------+ CFV  Full           Yes      Yes                                 +-----+---------------+---------+-----------+----------+--------------+   +---------+---------------+---------+-----------+----------+--------------+ LEFT     CompressibilityPhasicitySpontaneityPropertiesThrombus Aging +---------+---------------+---------+-----------+----------+--------------+ CFV      Full           Yes      Yes                                 +---------+---------------+---------+-----------+----------+--------------+  SFJ      Full                                                        +---------+---------------+---------+-----------+----------+--------------+ FV Prox  Full                                                        +---------+---------------+---------+-----------+----------+--------------+ FV Mid   Full                                                        +---------+---------------+---------+-----------+----------+--------------+ FV DistalFull                                                        +---------+---------------+---------+-----------+----------+--------------+ PFV      Full                                                        +---------+---------------+---------+-----------+----------+--------------+ POP      Full           Yes      Yes                                 +---------+---------------+---------+-----------+----------+--------------+ PTV       Full                                                        +---------+---------------+---------+-----------+----------+--------------+ PERO     Full                                                        +---------+---------------+---------+-----------+----------+--------------+     Summary: RIGHT: - No evidence of common femoral vein obstruction.  LEFT: - There is no evidence of deep vein thrombosis in the lower extremity. However, portions of this examination were limited- see technologist comments above.  - No cystic structure found in the popliteal fossa.  *See table(s) above for measurements and observations. Electronically signed by Deitra Mayo MD on 11/02/2021 at 4:20:57 PM.    Final     Recent Labs: Lab Results  Component Value Date   WBC 29.5 (H) 11/20/2021   HGB 9.3 (L) 11/20/2021   PLT 666 (H) 11/20/2021   NA 133 (L) 11/20/2021   K  5.3 (H) 11/20/2021   CL 109 11/20/2021   CO2 16 (L) 11/20/2021   GLUCOSE 218 (H) 11/20/2021   BUN 56 (H) 11/20/2021   CREATININE 2.38 (H) 11/20/2021   BILITOT 0.2 10/30/2021   ALKPHOS 53 06/04/2021   AST 9 (L) 10/30/2021   ALT 7 (L) 10/30/2021   PROT 6.7 10/30/2021   ALBUMIN 3.7 06/04/2021   CALCIUM 9.0 11/20/2021   GFRAA >60 11/25/2016    Speciality Comments: No specialty comments available.  Procedures:  No procedures performed Allergies: Patient has no known allergies.   Assessment / Plan:     Visit Diagnoses: Chronic tophaceous gout - Uncontrolled he has extensive tophaceous deposits and pretty much constant inflammation whenever he is off prednisone. uric acid: 11.7 on 10/30/2021  He is not able to safely tolerate other oral alternative medications after failure to improve on allopurinol and uloric now. Recently hospitalized due to his gout and also unable to work any significant amount. He will taper dose but remain on 10mg  daily for now to avoid repeat severe flare. I suspect his best option will be krystexxa  treatment, which we previously discussed but did not start before exhausting additional options including uloric.  High risk medication use - Uloric 40 mg daily, prednisone 10 mg daily.   Reviewed risks of krystexxa treatment especially for infusion reaction and anaphylaxis and for provoking gout flare. He is not a candidate to take methotrexate due to severe renal disease.  Non-pressure chronic ulcer of other part of left foot limited to breakdown of skin ([place]) - Plan: AMB referral to wound care center  Left foot lesion looks like it is from the severe amount of prolonged edema less likely a severe postinflammatory desquamation based on location. He does not seem to have adequate self management of dressing this currently, will refer to wound care.  Orders: Orders Placed This Encounter  Procedures   AMB referral to wound care center   No orders of the defined types were placed in this encounter.    Follow-Up Instructions: No follow-ups on file.   Collier Salina, MD  Note - This record has been created using [employer].  Chart creation errors have been sought, but may not always  have been located. Such creation errors do not reflect on  the standard of medical care.

## 2021-11-17 NOTE — Telephone Encounter (Signed)
He should probably go and get evaluated at the hospital if worsening by that much. I don't have much specific we can do here beyond treatments we already tried and his severity of leg swelling is concerning along with lab abnormalities in his kidney function.

## 2021-11-17 NOTE — ED Provider Notes (Signed)
Emergency Department Provider Note   I have reviewed the triage vital signs and the nursing notes.   HISTORY  Chief Complaint Leg Pain   HPI Nazar Kuan is a 66 y.o. male with past medical history of chronic tophaceous gout, hypertension, CKD presents to the emergency department with worsening swelling and pain in the left leg.  He has been dealing with these issues over the past 2 weeks.  He is followed by rheumatology here at [facility] and has been to the emergency department on 5/15 for the same issue.  He notes gouty tophi formation in the left knee, ankle, left hand.  He has been on prednisone for the past 2 weeks and after seeing his rheumatologist on 5/19 switched to Uloric and prednisone, 10 mg daily.  He denies any fevers or shaking chills.  With worsening pain and swelling his foot has developed some blistering, which she first noticed today. No CP or SOB.    Past Medical History:  Diagnosis Date   Gout    Hypertension     Review of Systems  Constitutional: No fever/chills Eyes: No visual changes. ENT: No sore throat. Cardiovascular: Denies chest pain. Respiratory: Denies shortness of breath. Gastrointestinal: No abdominal pain.  No nausea, no vomiting.  No diarrhea.  No constipation. Genitourinary: Negative for dysuria. Musculoskeletal: Negative for back pain. Positive left leg pain and swelling.  Skin: Gout tophi to the left hand, knee, and ankle. Neurological: Negative for headaches, focal weakness or numbness.   ____________________________________________   PHYSICAL EXAM:  VITAL SIGNS: ED Triage Vitals  Enc Vitals Group     BP 11/17/21 1916 (!) 154/82     Pulse Rate 11/17/21 1916 98     Resp 11/17/21 1916 18     Temp 11/17/21 1916 98 F (36.7 C)     Temp Source 11/17/21 1916 Oral     SpO2 11/17/21 1916 100 %   Constitutional: Alert and oriented. Well appearing and in no acute distress. Eyes: Conjunctivae are normal.  Head:  Atraumatic. Nose: No congestion/rhinnorhea. Mouth/Throat: Mucous membranes are moist.   Neck: No stridor.   Cardiovascular: Normal rate, regular rhythm. Good peripheral circulation. Grossly normal heart sounds.   Respiratory: Normal respiratory effort.  No retractions. Lungs CTAB. Gastrointestinal: Soft and nontender. No distention.  Musculoskeletal: Gouty tophi to multiple joints of the left hand and wrist.  The left lower extremity is diffusely swollen with 4+ pitting edema.  Tophi noted to the knee but knee joint is without significant erythema or warmth.  Neurologic:  Normal speech and language. No gross focal neurologic deficits are appreciated.  Skin:  Skin is warm and dry.  Gout tophi throughout the left lower extremity.  Some blistering noted to the left lower extremity along with diffuse swelling.        ____________________________________________   LABS (all labs ordered are listed, but only abnormal results are displayed)  Labs Reviewed  CBC - Abnormal; Notable for the following components:      Result Value   WBC 25.5 (*)    RBC 2.95 (*)    Hemoglobin 8.4 (*)    HCT 25.2 (*)    RDW 17.7 (*)    Platelets 794 (*)    All other components within normal limits  BASIC METABOLIC PANEL - Abnormal; Notable for the following components:   Sodium 132 (*)    CO2 15 (*)    Glucose, Bld 188 (*)    BUN 39 (*)  Creatinine, Ser 2.32 (*)    Calcium 8.7 (*)    GFR, Estimated 31 (*)    All other components within normal limits  SEDIMENTATION RATE - Abnormal; Notable for the following components:   Sed Rate 123 (*)    All other components within normal limits  URIC ACID - Abnormal; Notable for the following components:   Uric Acid, Serum 10.2 (*)    All other components within normal limits  C-REACTIVE PROTEIN - Abnormal; Notable for the following components:   CRP 11.5 (*)    All other components within normal limits  CULTURE, BLOOD (ROUTINE X 2)  CULTURE, BLOOD (ROUTINE  X 2)  HIV ANTIBODY (ROUTINE TESTING W REFLEX)  CBC  BASIC METABOLIC PANEL  PROCALCITONIN   ____________________________________________  RADIOLOGY  DG Ankle Complete Left  Result Date: 11/17/2021 CLINICAL DATA:  Pain and swelling. EXAM: LEFT ANKLE COMPLETE - 3+ VIEW COMPARISON:  None Available. FINDINGS: There is severe soft tissue swelling of the ankle. Rounded density projects from the skin surface measuring 10 x 24 mm along the lateral ankle. There is no radiopaque foreign body. There is no evidence for cortical erosion or focal osseous lesion. No acute fracture or dislocation. Joint spaces are well maintained. IMPRESSION: 1. Severe soft tissue swelling of the ankle. 2. Lateral ankle skin lesion, indeterminate. 3. No acute bony abnormality. Electronically Signed   By: Ronney Asters M.D.   On: 11/17/2021 23:33   DG Knee Complete 4 Views Left  Result Date: 11/17/2021 CLINICAL DATA:  Left leg pain and swelling, history of gout EXAM: LEFT KNEE - COMPLETE 4+ VIEW COMPARISON:  03/20/2021 FINDINGS: Frontal, bilateral oblique, and lateral views of the left knee are obtained. There is diffuse soft tissue swelling. Large joint effusion is noted. 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 No acute fracture, subluxation, or dislocation. No destructive bony lesions. Mild medial compartmental joint space narrowing. IMPRESSION: 1. Diffuse soft tissue swelling. 2. Large joint effusion. 3. No acute or destructive bony lesions. 4. Osteopenia. Electronically Signed   By: Randa Ngo M.D.   On: 11/17/2021 20:08   DG Foot Complete Left  Result Date: 11/17/2021 CLINICAL DATA:  Pain and swelling. EXAM: LEFT FOOT - COMPLETE 3+ VIEW COMPARISON:  Left foot x-ray 03/20/2021 FINDINGS: There is severe soft tissue swelling over the dorsum of the foot and surrounding the ankle. No radiopaque foreign body. No acute fracture or dislocation. No cortical erosions identified. There is stable  cystic degenerative change seen at the first interphalangeal joint. IMPRESSION: 1. Severe soft tissue swelling of the foot and ankle. 2. No acute bony abnormality. 3. Stable cystic degenerative change at the first interphalangeal joint. Electronically Signed   By: Ronney Asters M.D.   On: 11/17/2021 23:35    ____________________________________________   PROCEDURES  Procedure(s) performed:   Procedures  None  ____________________________________________   INITIAL IMPRESSION / ASSESSMENT AND PLAN / ED COURSE  Pertinent labs & imaging results that were available during my care of the patient were reviewed by me and considered in my medical decision making (see chart for details).   This patient is Presenting for Evaluation of leg pain, which does require a range of treatment options, and is a complaint that involves a high risk of morbidity and mortality.  The Differential Diagnoses include gout flare, developing infection, compartment syndrome, fracture, septic joint.  Critical Interventions-    Medications  acetaminophen (TYLENOL) tablet 650 mg (has no administration in time range)  febuxostat (ULORIC) tablet 40 mg (has no administration in time range)  amLODipine (NORVASC) tablet 5 mg (has no administration in time range)  predniSONE (DELTASONE) tablet 10 mg (has no administration in time range)  apixaban (ELIQUIS) tablet 5 mg (has no administration in time range)  feeding supplement (ENSURE ENLIVE / ENSURE PLUS) liquid 237 mL (has no administration in time range)  ondansetron (ZOFRAN) injection 4 mg (4 mg Intravenous Given 11/18/21 0128)  vancomycin (VANCOREADY) IVPB 1250 mg/250 mL (0 mg Intravenous Stopped 11/18/21 0300)    Reassessment after intervention: Pain improved.    I decided to review pertinent External Data, and in summary patient seen by his [facility] Rheumatologist regarding this issue on the 19th. That note was reviewed.   Clinical Laboratory Tests Ordered, included  patient with slightly worsening leukocytosis to 25.5.  Hemoglobin at 8.4.  GFR at 31 slightly worse than his baseline.   Radiologic Tests Ordered, included knee, ankle, and foot x-ray. I independently interpreted the images and agree with radiology interpretation.   Cardiac Monitor Tracing which shows NSR.   Social Determinants of Health Risk patient with a smoking history.   Consult complete with   Hospitalist - Dr. Marlowe Sax who agrees with plan for admit.   Medical Decision Making: Summary:  Patient presents emergency department with progressive worsening swelling in the left lower extremity with diffuse to the disc out and inflammation.  He has a knee effusion noted on x-ray but in examining the knee the joint seems most consistent with gout.  I do not appreciate a hot/erythematous joint to strongly suspect septic arthritis.  Most of the patient's pain is actually in the foot and ankle area which showed diffuse edema and some blistering of skin due to swelling.   Reevaluation with update and discussion with patient. Pain better controlled. Discussed labs and imaging. Plan for admit. He is in agreement.    Disposition: admit  ____________________________________________  FINAL CLINICAL IMPRESSION(S) / ED DIAGNOSES  Final diagnoses:  Left leg pain    Note:  This document was prepared using Dragon voice recognition software and may include unintentional dictation errors.  Nanda Quinton, MD, [facility] Emergency Medicine    Leslie Langille, Wonda Olds, MD 11/18/21 [phone]

## 2021-11-18 ENCOUNTER — Observation Stay (HOSPITAL_COMMUNITY): Payer: PRIVATE HEALTH INSURANCE

## 2021-11-18 ENCOUNTER — Observation Stay (HOSPITAL_BASED_OUTPATIENT_CLINIC_OR_DEPARTMENT_OTHER): Payer: PRIVATE HEALTH INSURANCE

## 2021-11-18 ENCOUNTER — Telehealth: Payer: Self-pay | Admitting: Internal Medicine

## 2021-11-18 DIAGNOSIS — E43 Unspecified severe protein-calorie malnutrition: Secondary | ICD-10-CM | POA: Insufficient documentation

## 2021-11-18 DIAGNOSIS — R600 Localized edema: Secondary | ICD-10-CM

## 2021-11-18 DIAGNOSIS — D638 Anemia in other chronic diseases classified elsewhere: Secondary | ICD-10-CM

## 2021-11-18 DIAGNOSIS — R609 Edema, unspecified: Secondary | ICD-10-CM

## 2021-11-18 DIAGNOSIS — N184 Chronic kidney disease, stage 4 (severe): Secondary | ICD-10-CM

## 2021-11-18 LAB — BASIC METABOLIC PANEL
Anion gap: 9 (ref 5–15)
BUN: 39 mg/dL — ABNORMAL HIGH (ref 8–23)
CO2: 16 mmol/L — ABNORMAL LOW (ref 22–32)
Calcium: 8.5 mg/dL — ABNORMAL LOW (ref 8.9–10.3)
Chloride: 109 mmol/L (ref 98–111)
Creatinine, Ser: 2.14 mg/dL — ABNORMAL HIGH (ref 0.61–1.24)
GFR, Estimated: 34 mL/min — ABNORMAL LOW (ref >60)
Glucose, Bld: 191 mg/dL — ABNORMAL HIGH (ref 70–99)
Potassium: 4.3 mmol/L (ref 3.5–5.1)
Sodium: 134 mmol/L — ABNORMAL LOW (ref 135–145)

## 2021-11-18 LAB — RETICULOCYTES
Immature Retic Fract: 24.7 % — ABNORMAL HIGH (ref 2.3–15.9)
RBC.: 2.34 MIL/uL — ABNORMAL LOW (ref 4.22–5.81)
Retic Count, Absolute: 39.8 10*3/uL (ref 19.0–186.0)
Retic Ct Pct: 1.7 % (ref 0.4–3.1)

## 2021-11-18 LAB — IRON AND TIBC
Iron: 13 ug/dL — ABNORMAL LOW (ref 45–182)
Saturation Ratios: 7 % — ABNORMAL LOW (ref 17.9–39.5)
TIBC: 188 ug/dL — ABNORMAL LOW (ref 250–450)
UIBC: 175 ug/dL

## 2021-11-18 LAB — ABO/RH: ABO/RH(D): O POS

## 2021-11-18 LAB — SYNOVIAL CELL COUNT + DIFF, W/ CRYSTALS
Eosinophils-Synovial: 0 % (ref 0–1)
Lymphocytes-Synovial Fld: 0 % (ref 0–20)
Monocyte-Macrophage-Synovial Fluid: 4 % — ABNORMAL LOW (ref 50–90)
Neutrophil, Synovial: 96 % — ABNORMAL HIGH (ref 0–25)
WBC, Synovial: 18250 /mm3 — ABNORMAL HIGH (ref 0–200)

## 2021-11-18 LAB — CBC
HCT: 19.6 % — ABNORMAL LOW (ref 39.0–52.0)
Hemoglobin: 6.8 g/dL — CL (ref 13.0–17.0)
MCH: 28.8 pg (ref 26.0–34.0)
MCHC: 34.7 g/dL (ref 30.0–36.0)
MCV: 83.1 fL (ref 80.0–100.0)
Platelets: 668 10*3/uL — ABNORMAL HIGH (ref 150–400)
RBC: 2.36 MIL/uL — ABNORMAL LOW (ref 4.22–5.81)
RDW: 17.4 % — ABNORMAL HIGH (ref 11.5–15.5)
WBC: 21.9 10*3/uL — ABNORMAL HIGH (ref 4.0–10.5)
nRBC: 0 % (ref 0.0–0.2)

## 2021-11-18 LAB — PROCALCITONIN: Procalcitonin: 0.21 ng/mL

## 2021-11-18 LAB — SEDIMENTATION RATE: Sed Rate: 123 mm/hr — ABNORMAL HIGH (ref 0–16)

## 2021-11-18 LAB — VITAMIN B12: Vitamin B-12: 1348 pg/mL — ABNORMAL HIGH (ref 180–914)

## 2021-11-18 LAB — FOLATE: Folate: 8.7 ng/mL (ref >5.9)

## 2021-11-18 LAB — URIC ACID: Uric Acid, Serum: 10.2 mg/dL — ABNORMAL HIGH (ref 3.7–8.6)

## 2021-11-18 LAB — FERRITIN: Ferritin: 168 ng/mL (ref 24–336)

## 2021-11-18 LAB — C-REACTIVE PROTEIN: CRP: 11.5 mg/dL — ABNORMAL HIGH (ref <1.0)

## 2021-11-18 LAB — PREPARE RBC (CROSSMATCH)

## 2021-11-18 LAB — HIV ANTIBODY (ROUTINE TESTING W REFLEX): HIV Screen 4th Generation wRfx: NONREACTIVE

## 2021-11-18 MED ORDER — FEBUXOSTAT 40 MG PO TABS
40.0000 mg | ORAL_TABLET | Freq: Every day | ORAL | Status: DC
  Administered 2021-11-18 – 2021-11-20 (×3): 40 mg via ORAL
  Filled 2021-11-18 (×3): qty 1

## 2021-11-18 MED ORDER — DARBEPOETIN ALFA 60 MCG/0.3ML IJ SOSY
60.0000 ug | PREFILLED_SYRINGE | Freq: Once | INTRAMUSCULAR | Status: AC
  Administered 2021-11-18: 60 ug via SUBCUTANEOUS
  Filled 2021-11-18: qty 0.3

## 2021-11-18 MED ORDER — COLCHICINE 0.6 MG PO TABS
1.2000 mg | ORAL_TABLET | Freq: Once | ORAL | Status: AC
  Administered 2021-11-18: 1.2 mg via ORAL
  Filled 2021-11-18: qty 2

## 2021-11-18 MED ORDER — METHYLPREDNISOLONE ACETATE 40 MG/ML IJ SUSP
40.0000 mg | Freq: Once | INTRAMUSCULAR | Status: AC
  Administered 2021-11-18: 40 mg via INTRA_ARTICULAR
  Filled 2021-11-18: qty 1

## 2021-11-18 MED ORDER — COLCHICINE 0.6 MG PO TABS: 0.6000 mg | ORAL_TABLET | Freq: Every day | ORAL | Status: DC

## 2021-11-18 MED ORDER — AMLODIPINE BESYLATE 5 MG PO TABS
5.0000 mg | ORAL_TABLET | Freq: Every day | ORAL | Status: DC
  Administered 2021-11-18 – 2021-11-20 (×3): 5 mg via ORAL
  Filled 2021-11-18 (×3): qty 1

## 2021-11-18 MED ORDER — SODIUM BICARBONATE 650 MG PO TABS: 325.0000 mg | ORAL_TABLET | Freq: Two times a day (BID) | ORAL | Status: DC

## 2021-11-18 MED ORDER — PREDNISONE 10 MG PO TABS
10.0000 mg | ORAL_TABLET | Freq: Every day | ORAL | Status: DC
Start: 2021-11-18 — End: 2021-11-18
  Administered 2021-11-18: 10 mg via ORAL
  Filled 2021-11-18: qty 1

## 2021-11-18 MED ORDER — APIXABAN 5 MG PO TABS: 5.0000 mg | ORAL_TABLET | Freq: Two times a day (BID) | ORAL | Status: DC

## 2021-11-18 MED ORDER — ACETAMINOPHEN 325 MG PO TABS
650.0000 mg | ORAL_TABLET | Freq: Four times a day (QID) | ORAL | Status: DC | PRN
Start: 2021-11-18 — End: 2021-11-20
  Filled 2021-11-18: qty 2

## 2021-11-18 MED ORDER — BUPIVACAINE HCL (PF) 0.5 % IJ SOLN
10.0000 mL | Freq: Once | INTRAMUSCULAR | Status: AC
  Administered 2021-11-18: 10 mL
  Filled 2021-11-18: qty 10

## 2021-11-18 MED ORDER — METHYLPREDNISOLONE SODIUM SUCC 125 MG IJ SOLR
125.0000 mg | Freq: Once | INTRAMUSCULAR | Status: AC
  Administered 2021-11-18: 125 mg via INTRAVENOUS
  Filled 2021-11-18: qty 2

## 2021-11-18 MED ORDER — APIXABAN 5 MG PO TABS
5.0000 mg | ORAL_TABLET | Freq: Two times a day (BID) | ORAL | Status: DC
  Administered 2021-11-19 – 2021-11-20 (×3): 5 mg via ORAL
  Filled 2021-11-18 (×3): qty 1

## 2021-11-18 MED ORDER — COLCHICINE 0.6 MG PO TABS
0.6000 mg | ORAL_TABLET | Freq: Every day | ORAL | Status: DC
  Administered 2021-11-18 – 2021-11-19 (×2): 0.6 mg via ORAL
  Filled 2021-11-18 (×2): qty 1

## 2021-11-18 MED ORDER — ADULT MULTIVITAMIN W/MINERALS CH
1.0000 | ORAL_TABLET | Freq: Every day | ORAL | Status: DC
  Administered 2021-11-18 – 2021-11-20 (×3): 1 via ORAL
  Filled 2021-11-18 (×3): qty 1

## 2021-11-18 MED ORDER — SODIUM CHLORIDE 0.9% IV SOLUTION
Freq: Once | INTRAVENOUS | Status: AC
Start: 2021-11-18 — End: 2021-11-18

## 2021-11-18 MED ORDER — VANCOMYCIN HCL 1250 MG/250ML IV SOLN
1250.0000 mg | Freq: Once | INTRAVENOUS | Status: AC
  Administered 2021-11-18: 1250 mg via INTRAVENOUS
  Filled 2021-11-18: qty 250

## 2021-11-18 MED ORDER — ENSURE ENLIVE PO LIQD
237.0000 mL | Freq: Two times a day (BID) | ORAL | Status: DC
  Administered 2021-11-18 (×2): 237 mL via ORAL

## 2021-11-18 MED ORDER — METHYLPREDNISOLONE SODIUM SUCC 125 MG IJ SOLR
60.0000 mg | INTRAMUSCULAR | Status: DC
  Administered 2021-11-19 – 2021-11-20 (×2): 60 mg via INTRAVENOUS
  Filled 2021-11-18 (×2): qty 2

## 2021-11-18 NOTE — Progress Notes (Signed)
[facility]                                                                              Brad [name], is a 66 y.o. male, DOB - Jan 21, 1956, TWK:[id number] Admit date - 11/17/2021    Outpatient Primary MD for the patient is [name], [name], MD  LOS - 0  days  Chief Complaint  Patient presents with   Leg Pain       Brief summary   Patient is a 66 year old male with chronic tophaceous gout seen by rheumatology, hypertension, chronic anemia, CKD stage IV, paroxysmal A-fib on Eliquis, chronic leukocytosis and thrombocytosis seen by hematology presented to the ED complaining of left leg pain and swelling. Patient reports 2-week history of left leg swelling.  States he does not feel any pain in the leg when resting but only feels it when he gets up to walk due to his leg being so swollen.  Reports history of chronic tophaceous gout for which he is on Uloric and a prednisone taper but the swelling has not improved.  He has been taking prednisone for the past 1 month. ESR 123, CRP 11.5.  Uric acid 10.2 (stable since labs done 2 weeks ago). X-ray of left knee showing diffuse soft tissue swelling, large joint effusion, and no acute or destructive bony lesions.  X-ray of left ankle/foot showing severe soft tissue swelling and no acute bony abnormality.   Assessment & Plan    Principal Problem: Edema of left lower extremity Acute on chronic gout  - venous doppler negative for DVT but showed 2.0x2.4x2.3 cm hetergenous area of the left popliteal fossa , recommend further imaging.  - Ortho consulted, left knee aspiration consistent with acute gout - ESR, CRP, uric acid elevated -hold maintenance prednisone. Start solumedrol 136m IV x1, then start solumedrol 646mIV daily, colchicine  - continue febuxostat - NSAIDS's contraindicated due to AKI    Active Problems: Progressive knee pain with effusion  - venous doppler negative for DVT but showed 2.0x 2.4x 2.3 cm  hetergenous area of the left popliteal fossa, recommend further imaging.  - Will obtain CT imaging  - likely acute gout, s/p aspiration and injection by ortho, appreciate assistance     Essential hypertension - BP stable, continue amodipine     Chronic tophaceous gout - Hold maintenance prednisone, treating acute flare  - continue uloric, outpatient follow-up with rheumatology     Acute on chronic Anemia, from CKD - baseline ~8, Hb 6.8, no bleeding  - will transfuse 1 unit pRBC, will benefit from ESA     Paroxysmal atrial fibrillation (HCC) - rate controlled, continue eliquis     Chronic kidney disease, stage IV (severe) (HCC) - continue bicarb  - baseline Cr 2.1-3.1     Chronic Thrombocytosis - stable   Code Status: full  DVT Prophylaxis:   apixaban (ELIQUIS) tablet 5 mg   Level of Care: Level of care: Med-Surg Family Communication: Updated patient   Disposition Plan:      Remains inpatient appropriate:  severe edema and acute gout Left LE   Procedures:  5/31 Left knee aspiration  and injection   Consultants:   Orthopedics   Antimicrobials:   Anti-infectives (From admission, onward)    Start     Dose/Rate Route Frequency Ordered Stop   11/18/21 0100  vancomycin (VANCOREADY) IVPB 1250 mg/250 mL        1,250 mg 166.7 mL/hr over 90 Minutes Intravenous  Once 11/18/21 0059 11/18/21 0300          Medications  amLODipine  5 mg Oral Daily   [START ON 11/19/2021] apixaban  5 mg Oral BID   bupivacaine(PF)  10 mL Infiltration Once   febuxostat  40 mg Oral Daily   feeding supplement  237 mL Oral BID BM   methylPREDNISolone acetate  40 mg Intra-articular Once   multivitamin with minerals  1 tablet Oral Daily   predniSONE  10 mg Oral Q breakfast      Subjective:   Brad [name] was seen and examined today.  C/p pain in left leg, hard to bear weight due to pain.  Patient denies dizziness, chest pain, shortness of breath, abdominal pain, N/V/D  Objective:    Vitals:   11/18/21 0748 11/18/21 0930 11/18/21 1006 11/18/21 1231  BP: (!) 149/80 134/76 117/74 121/77  Pulse: 72 65 66 65  Resp: '16 18 16 16  ' Temp: 98 F (36.7 C) 98.1 F (36.7 C) 98.1 F (36.7 C) 98 F (36.7 C)  TempSrc: Oral Oral Oral Oral  SpO2: 100% 100% 100% 100%  Weight:      Height:        Intake/Output Summary (Last 24 hours) at 11/18/2021 1448 Last data filed at 11/18/2021 1230 Gross per 24 hour  Intake 337.5 ml  Output --  Net 337.5 ml     Wt Readings from Last 3 Encounters:  11/18/21 68.4 kg  11/02/21 65.8 kg  10/30/21 65.8 kg     Exam General: Alert and oriented x 3, NAD Cardiovascular: S1 S2 auscultated,  RRR Respiratory: Clear to auscultation bilaterally, no wheezing Gastrointestinal: Soft, nontender, nondistended, + bowel sounds Ext: 2+ pedal edema LLE  Neuro: Strength 5/5 upper and lower extremities bilaterally Musculoskeletal: No digital cyanosis, clubbing, left knee effusion  Psych: Normal affect and demeanor, alert and oriented x3     Data Reviewed:  I have personally reviewed following labs    CBC Lab Results  Component Value Date   WBC 21.9 (H) 11/18/2021   RBC 2.34 (L) 11/18/2021   HGB 6.8 (LL) 11/18/2021   HCT 19.6 (L) 11/18/2021   MCV 83.1 11/18/2021   MCH 28.8 11/18/2021   PLT 668 (H) 11/18/2021   MCHC 34.7 11/18/2021   RDW 17.4 (H) 11/18/2021   LYMPHSABS 1,050 11/06/2021   MONOABS 0.5 06/04/2021   EOSABS 0 (L) 11/06/2021   BASOSABS 40 33/61/2244     Last metabolic panel Lab Results  Component Value Date   NA 134 (L) 11/18/2021   K 4.3 11/18/2021   CL 109 11/18/2021   CO2 16 (L) 11/18/2021   BUN 39 (H) 11/18/2021   CREATININE 2.14 (H) 11/18/2021   GLUCOSE 191 (H) 11/18/2021   GFRNONAA 34 (L) 11/18/2021   GFRAA >60 11/25/2016   CALCIUM 8.5 (L) 11/18/2021   PHOS 6.2 (H) 11/06/2021   PROT 6.7 10/30/2021   ALBUMIN 3.7 06/04/2021   LABGLOB 3.0 01/27/2021   AGRATIO 1.2 01/27/2021   BILITOT 0.2 10/30/2021    ALKPHOS 53 06/04/2021   AST 9 (L) 10/30/2021   ALT 7 (L) 10/30/2021   ANIONGAP 9 11/18/2021  CBG (last 3)  No results for input(s): GLUCAP in the last 72 hours.    Coagulation Profile: No results for input(s): INR, PROTIME in the last 168 hours.   Radiology Studies: I have personally reviewed the imaging studies  DG Ankle Complete Left  Result Date: 11/17/2021 CLINICAL DATA:  Pain and swelling. EXAM: LEFT ANKLE COMPLETE - 3+ VIEW COMPARISON:  None Available. FINDINGS: There is severe soft tissue swelling of the ankle. Rounded density projects from the skin surface measuring 10 x 24 mm along the lateral ankle. There is no radiopaque foreign body. There is no evidence for cortical erosion or focal osseous lesion. No acute fracture or dislocation. Joint spaces are well maintained. IMPRESSION: 1. Severe soft tissue swelling of the ankle. 2. Lateral ankle skin lesion, indeterminate. 3. No acute bony abnormality. Electronically Signed   By: Ronney Asters M.D.   On: 11/17/2021 23:33   DG Knee Complete 4 Views Left  Result Date: 11/17/2021 CLINICAL DATA:  Left leg pain and swelling, history of gout EXAM: LEFT KNEE - COMPLETE 4+ VIEW COMPARISON:  03/20/2021 FINDINGS: Frontal, bilateral oblique, and lateral views of the left knee are obtained. There is diffuse soft tissue swelling. Large joint effusion is noted. 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 No acute fracture, subluxation, or dislocation. No destructive bony lesions. Mild medial compartmental joint space narrowing. IMPRESSION: 1. Diffuse soft tissue swelling. 2. Large joint effusion. 3. No acute or destructive bony lesions. 4. Osteopenia. Electronically Signed   By: Randa Ngo M.D.   On: 11/17/2021 20:08   DG Foot Complete Left  Result Date: 11/17/2021 CLINICAL DATA:  Pain and swelling. EXAM: LEFT FOOT - COMPLETE 3+ VIEW COMPARISON:  Left foot x-ray 03/20/2021 FINDINGS: There is severe soft tissue  swelling over the dorsum of the foot and surrounding the ankle. No radiopaque foreign body. No acute fracture or dislocation. No cortical erosions identified. There is stable cystic degenerative change seen at the first interphalangeal joint. IMPRESSION: 1. Severe soft tissue swelling of the foot and ankle. 2. No acute bony abnormality. 3. Stable cystic degenerative change at the first interphalangeal joint. Electronically Signed   By: Ronney Asters M.D.   On: 11/17/2021 23:35   VAS [place] LOWER EXTREMITY VENOUS (DVT) (ONLY [facility] & [facility])  Result Date: 11/18/2021  Lower Venous DVT Study Patient Name:  Brad [name]  Date of Exam:   11/18/2021 Medical Rec #: [id number]       Accession #:    [id number] Date of Birth: 1956/05/11      Patient Gender: M Patient Age:   70 years Exam Location:  [facility] Procedure:      VAS [place] LOWER EXTREMITY VENOUS (DVT) Referring Phys: Wandra Feinstein RATHORE --------------------------------------------------------------------------------  Indications: Left greater than right worsening edema.  Comparison Study: 11/02/2021- negative left lower extremity venous duplex Performing Technologist: Maudry Mayhew MHA, RDMS, RVT, RDCS  Examination Guidelines: A complete evaluation includes B-mode imaging, spectral Doppler, color Doppler, and power Doppler as needed of all accessible portions of each vessel. Bilateral testing is considered an integral part of a complete examination. Limited examinations for reoccurring indications may be performed as noted. The reflux portion of the exam is performed with the patient in reverse Trendelenburg.  +-----+---------------+---------+-----------+----------+--------------+ RIGHTCompressibilityPhasicitySpontaneityPropertiesThrombus Aging +-----+---------------+---------+-----------+----------+--------------+ CFV  Full           Yes      Yes                                 +-----+---------------+---------+-----------+----------+--------------+    +---------+---------------+---------+-----------+----------+--------------+  LEFT     CompressibilityPhasicitySpontaneityPropertiesThrombus Aging +---------+---------------+---------+-----------+----------+--------------+ CFV      Full           Yes      Yes                                 +---------+---------------+---------+-----------+----------+--------------+ SFJ      Full                                                        +---------+---------------+---------+-----------+----------+--------------+ FV Prox  Full                                                        +---------+---------------+---------+-----------+----------+--------------+ FV Mid   Full                                                        +---------+---------------+---------+-----------+----------+--------------+ FV DistalFull                                                        +---------+---------------+---------+-----------+----------+--------------+ PFV      Full                                                        +---------+---------------+---------+-----------+----------+--------------+ POP      Full           Yes      Yes                                 +---------+---------------+---------+-----------+----------+--------------+ PTV      Full                                                        +---------+---------------+---------+-----------+----------+--------------+ PERO     Full                                                        +---------+---------------+---------+-----------+----------+--------------+     Summary: RIGHT: - No evidence of common femoral vein obstruction.  LEFT: - There is no evidence of deep vein thrombosis in the lower extremity.  - No cystic structure found in the popliteal fossa. - Heterogenous area of the left poplitea fossa with  internal low-resistant vascularity, and poorly defined margins measuring 2.0 x 2.4 x 2.3 cm.  Etiology is unknown; recommend further imaging as clinically indicated.  *See table(s) above for measurements and observations.    Preliminary        Estill Cotta M.D. [facility] 11/18/2021, 2:48 PM  Available via Epic secure chat 7am-7pm After 7 pm, please refer to night coverage provider listed on amion.

## 2021-11-18 NOTE — ED Notes (Signed)
Patient provided a meal and drink at this time

## 2021-11-18 NOTE — Telephone Encounter (Signed)
Patient called the office stating he went to the ED last night and has been admitted to [facility] as of this morning. Patient states he would like to make [name] aware of this.

## 2021-11-18 NOTE — Consult Note (Signed)
Reason for Consult:Left knee effusion Referring Physician: Ripudeep Rai Time called: 1211 Time at bedside: [name] is an 66 y.o. male.  HPI: Brad [name] has had progressive right knee pain for about 2 1/2 weeks. He has a long hx/o tophaceous gout. He also has lower leg edema that is fairly severe. As he also has a large knee effusion orthopedic surgery was consulted for aspiration and possible injection. He denies fevers, chills, sweats, N/V.  Past Medical History:  Diagnosis Date   Gout    Hypertension     History reviewed. No pertinent surgical history.  Family History  Problem Relation Age of Onset   Diabetes Mother     Social History:  reports that he has been smoking cigarettes. He has a 3.60 pack-year smoking history. He has never used smokeless tobacco. He reports current alcohol use of about 3.0 standard drinks per week. He reports that he does not use drugs.  Allergies: No Known Allergies  Medications: I have reviewed the patient's current medications.  Results for orders placed or performed during the hospital encounter of 11/17/21 (from the past 48 hour(s))  CBC     Status: Abnormal   Collection Time: 11/17/21  7:53 PM  Result Value Ref Range   WBC 25.5 (H) 4.0 - 10.5 K/uL   RBC 2.95 (L) 4.22 - 5.81 MIL/uL   Hemoglobin 8.4 (L) 13.0 - 17.0 g/dL   HCT 25.2 (L) 39.0 - 52.0 %   MCV 85.4 80.0 - 100.0 fL   MCH 28.5 26.0 - 34.0 pg   MCHC 33.3 30.0 - 36.0 g/dL   RDW 17.7 (H) 11.5 - 15.5 %   Platelets 794 (H) 150 - 400 K/uL   nRBC 0.0 0.0 - 0.2 %    Comment: Performed at [facility], [address], [place], [place] [address]  Basic metabolic panel     Status: Abnormal   Collection Time: 11/17/21  7:53 PM  Result Value Ref Range   Sodium 132 (L) 135 - 145 mmol/L   Potassium 4.2 3.5 - 5.1 mmol/L   Chloride 106 98 - 111 mmol/L   CO2 15 (L) 22 - 32 mmol/L   Glucose, Bld 188 (H) 70 - 99 mg/dL    Comment: Glucose reference range applies only to samples  taken after fasting for at least 8 hours.   BUN 39 (H) 8 - 23 mg/dL   Creatinine, Ser 2.32 (H) 0.61 - 1.24 mg/dL   Calcium 8.7 (L) 8.9 - 10.3 mg/dL   GFR, Estimated 31 (L) >60 mL/min    Comment: (NOTE) Calculated using the CKD-EPI Creatinine Equation (2021)    Anion gap 11 5 - 15    Comment: Performed at [facility] [address], [place], [place] [address]  Sedimentation rate     Status: Abnormal   Collection Time: 11/17/21  7:53 PM  Result Value Ref Range   Sed Rate 123 (H) 0 - 16 mm/hr    Comment: Performed at [facility] [address] [place], [place] [address]  Uric acid     Status: Abnormal   Collection Time: 11/17/21  7:53 PM  Result Value Ref Range   Uric Acid, Serum 10.2 (H) 3.7 - 8.6 mg/dL    Comment: Performed at [facility] [address], [place], [place] [address]  C-reactive protein     Status: Abnormal   Collection Time: 11/17/21 11:30 PM  Result Value Ref Range  CRP 11.5 (H) <1.0 mg/dL    Comment: Performed at [facility] [address], [place], [place] [address]  Culture, blood (routine x 2)     Status: None (Preliminary result)   Collection Time: 11/18/21 12:14 AM   Specimen: BLOOD  Result Value Ref Range   Specimen Description BLOOD RIGHT ANTECUBITAL    Special Requests      BOTTLES DRAWN AEROBIC AND ANAEROBIC Blood Culture results may not be optimal due to an inadequate volume of blood received in culture bottles   Culture      NO GROWTH < 12 HOURS Performed at [facility] [address], [place], [place] [address]    Report Status PENDING   Culture, blood (routine x 2)     Status: None (Preliminary result)   Collection Time: 11/18/21 12:20 AM   Specimen: BLOOD RIGHT HAND  Result Value Ref Range   Specimen Description BLOOD RIGHT HAND    Special Requests      BOTTLES DRAWN AEROBIC AND ANAEROBIC Blood Culture results may not be optimal due to an inadequate volume of blood received in culture bottles   Culture       NO GROWTH < 12 HOURS Performed at [facility], [place] [address], [place], [place] [address]    Report Status PENDING   HIV Antibody (routine testing w rflx)     Status: None   Collection Time: 11/18/21  6:18 AM  Result Value Ref Range   HIV Screen 4th Generation wRfx Non Reactive Non Reactive    Comment: Performed at [facility], [place] [address], [place], [place] [address]  CBC     Status: Abnormal   Collection Time: 11/18/21  6:18 AM  Result Value Ref Range   WBC 21.9 (H) 4.0 - 10.5 K/uL   RBC 2.36 (L) 4.22 - 5.81 MIL/uL   Hemoglobin 6.8 (LL) 13.0 - 17.0 g/dL    Comment: REPEATED TO VERIFY THIS CRITICAL RESULT HAS VERIFIED AND BEEN CALLED TO B. BURTON, RN BY JULIE MACEDA DEL ANGEL ON 05 31 2023 AT [id number], AND HAS BEEN READ BACK.     HCT 19.6 (L) 39.0 - 52.0 %   MCV 83.1 80.0 - 100.0 fL   MCH 28.8 26.0 - 34.0 pg   MCHC 34.7 30.0 - 36.0 g/dL   RDW 17.4 (H) 11.5 - 15.5 %   Platelets 668 (H) 150 - 400 K/uL    Comment: REPEATED TO VERIFY   nRBC 0.0 0.0 - 0.2 %    Comment: Performed at [facility] [address] [place], [place] [address]  Basic metabolic panel     Status: Abnormal   Collection Time: 11/18/21  6:18 AM  Result Value Ref Range   Sodium 134 (L) 135 - 145 mmol/L   Potassium 4.3 3.5 - 5.1 mmol/L   Chloride 109 98 - 111 mmol/L   CO2 16 (L) 22 - 32 mmol/L   Glucose, Bld 191 (H) 70 - 99 mg/dL    Comment: Glucose reference range applies only to samples taken after fasting for at least 8 hours.   BUN 39 (H) 8 - 23 mg/dL   Creatinine, Ser 2.14 (H) 0.61 - 1.24 mg/dL   Calcium 8.5 (L) 8.9 - 10.3 mg/dL   GFR, Estimated 34 (L) >60 mL/min    Comment: (NOTE) Calculated using the CKD-EPI Creatinine Equation (2021)    Anion gap 9 5 - 15    Comment:  Performed at [facility], [place] [address], [place], [place] [address]  Procalcitonin - Baseline     Status: None   Collection Time: 11/18/21  6:18 AM  Result Value Ref Range   Procalcitonin 0.21 ng/mL     Comment:        Interpretation: PCT (Procalcitonin) <= 0.5 ng/mL: Systemic infection (sepsis) is not likely. Local bacterial infection is possible. (NOTE)       Sepsis PCT Algorithm           Lower Respiratory Tract                                      Infection PCT Algorithm    ----------------------------     ----------------------------         PCT < 0.25 ng/mL                PCT < 0.10 ng/mL          Strongly encourage             Strongly discourage   discontinuation of antibiotics    initiation of antibiotics    ----------------------------     -----------------------------       PCT 0.25 - 0.50 ng/mL            PCT 0.10 - 0.25 ng/mL               OR       >80% decrease in PCT            Discourage initiation of                                            antibiotics      Encourage discontinuation           of antibiotics    ----------------------------     -----------------------------         PCT >= 0.50 ng/mL              PCT 0.26 - 0.50 ng/mL               AND        <80% decrease in PCT             Encourage initiation of                                             antibiotics       Encourage continuation           of antibiotics    ----------------------------     -----------------------------        PCT >= 0.50 ng/mL                  PCT > 0.50 ng/mL               AND         increase in PCT                  Strongly encourage  initiation of antibiotics    Strongly encourage escalation           of antibiotics                                     -----------------------------                                           PCT <= 0.25 ng/mL                                                 OR                                        > 80% decrease in PCT                                      Discontinue / Do not initiate                                             antibiotics  Performed at [facility], [address],  [place], [place] [address]   ABO/Rh     Status: None   Collection Time: 11/18/21  6:18 AM  Result Value Ref Range   ABO/RH(D)      O POS Performed at [facility] [address] [place], [place] [address]   Prepare RBC (crossmatch)     Status: None   Collection Time: 11/18/21  7:12 AM  Result Value Ref Range   Order Confirmation      ORDER PROCESSED BY BLOOD BANK Performed at [facility], [place] [address] [place], [place] [address]   Vitamin B12     Status: Abnormal   Collection Time: 11/18/21  7:33 AM  Result Value Ref Range   Vitamin B-12 1,348 (H) 180 - 914 pg/mL    Comment: (NOTE) This assay is not validated for testing neonatal or myeloproliferative syndrome specimens for Vitamin B12 levels. Performed at [facility], [place] [address], [place], [place] [address]   Folate     Status: None   Collection Time: 11/18/21  7:33 AM  Result Value Ref Range   Folate 8.7 >5.9 ng/mL    Comment: Performed at [facility] [address] [place], [place] [address]  Iron and TIBC     Status: Abnormal   Collection Time: 11/18/21  7:33 AM  Result Value Ref Range   Iron 13 (L) 45 - 182 ug/dL   TIBC 188 (L) 250 - 450 ug/dL   Saturation Ratios 7 (L) 17.9 - 39.5 %   UIBC 175 ug/dL    Comment: Performed at [facility], [place] [address] [place], [place] [address]  Ferritin     Status: None   Collection Time: 11/18/21  7:33 AM  Result Value Ref  Range   Ferritin 168 24 - 336 ng/mL    Comment: Performed at [facility] [address], [place], [place] [address]  Reticulocytes     Status: Abnormal   Collection Time: 11/18/21  7:33 AM  Result Value Ref Range   Retic Ct Pct 1.7 0.4 - 3.1 %   RBC. 2.34 (L) 4.22 - 5.81 MIL/uL   Retic Count, Absolute 39.8 19.0 - 186.0 K/uL   Immature Retic Fract 24.7 (H) 2.3 - 15.9 %    Comment: Performed at [facility] [address] [place], [place] [address]  Type and screen     Status: None (Preliminary result)    Collection Time: 11/18/21  7:33 AM  Result Value Ref Range   ABO/RH(D) O POS    Antibody Screen NEG    Sample Expiration 11/21/2021,2359    Unit Number [id number]    Blood Component Type RED CELLS,LR    Unit division 00    Status of Unit ISSUED    Transfusion Status OK TO TRANSFUSE    Crossmatch Result      Compatible Performed at [facility], [place] [address] [place],  [address]     DG Ankle Complete Left  Result Date: 11/17/2021 CLINICAL DATA:  Pain and swelling. EXAM: LEFT ANKLE COMPLETE - 3+ VIEW COMPARISON:  None Available. FINDINGS: There is severe soft tissue swelling of the ankle. Rounded density projects from the skin surface measuring 10 x 24 mm along the lateral ankle. There is no radiopaque foreign body. There is no evidence for cortical erosion or focal osseous lesion. No acute fracture or dislocation. Joint spaces are well maintained. IMPRESSION: 1. Severe soft tissue swelling of the ankle. 2. Lateral ankle skin lesion, indeterminate. 3. No acute bony abnormality. Electronically Signed   By: Ronney Asters M.D.   On: 11/17/2021 23:33   DG Knee Complete 4 Views Left  Result Date: 11/17/2021 CLINICAL DATA:  Left leg pain and swelling, history of gout EXAM: LEFT KNEE - COMPLETE 4+ VIEW COMPARISON:  03/20/2021 FINDINGS: Frontal, bilateral oblique, and lateral views of the left knee are obtained. There is diffuse soft tissue swelling. Large joint effusion is noted. 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 No acute fracture, subluxation, or dislocation. No destructive bony lesions. Mild medial compartmental joint space narrowing. IMPRESSION: 1. Diffuse soft tissue swelling. 2. Large joint effusion. 3. No acute or destructive bony lesions. 4. Osteopenia. Electronically Signed   By: Randa Ngo M.D.   On: 11/17/2021 20:08   DG Foot Complete Left  Result Date: 11/17/2021 CLINICAL DATA:  Pain and swelling. EXAM: LEFT FOOT -  COMPLETE 3+ VIEW COMPARISON:  Left foot x-ray 03/20/2021 FINDINGS: There is severe soft tissue swelling over the dorsum of the foot and surrounding the ankle. No radiopaque foreign body. No acute fracture or dislocation. No cortical erosions identified. There is stable cystic degenerative change seen at the first interphalangeal joint. IMPRESSION: 1. Severe soft tissue swelling of the foot and ankle. 2. No acute bony abnormality. 3. Stable cystic degenerative change at the first interphalangeal joint. Electronically Signed   By: Ronney Asters M.D.   On: 11/17/2021 23:35    Review of Systems  Constitutional:  Negative for chills, diaphoresis and fever.  HENT:  Negative for ear discharge, ear pain, hearing loss and tinnitus.   Eyes:  Negative for photophobia and pain.  Respiratory:  Negative for cough and shortness of breath.  Cardiovascular:  Negative for chest pain.  Gastrointestinal:  Negative for abdominal pain, nausea and vomiting.  Genitourinary:  Negative for dysuria, flank pain, frequency and urgency.  Musculoskeletal:  Positive for arthralgias (Left knee/foot) and joint swelling. Negative for back pain, myalgias and neck pain.  Neurological:  Negative for dizziness and headaches.  Hematological:  Does not bruise/bleed easily.  Psychiatric/Behavioral:  The patient is not nervous/anxious.   Blood pressure 117/74, pulse 66, temperature 98.1 F (36.7 C), temperature source Oral, resp. rate 16, height 6' (1.829 m), weight 68.4 kg, SpO2 100 %. Physical Exam Constitutional:      General: He is not in acute distress.    Appearance: He is well-developed. He is not diaphoretic.  HENT:     Head: Normocephalic and atraumatic.  Eyes:     General: No scleral icterus.       Right eye: No discharge.        Left eye: No discharge.     Conjunctiva/sclera: Conjunctivae normal.  Cardiovascular:     Rate and Rhythm: Normal rate and regular rhythm.  Pulmonary:     Effort: Pulmonary effort is normal.  No respiratory distress.  Musculoskeletal:     Cervical back: Normal range of motion.     Comments: LLE No traumatic wounds, ecchymosis, or rash  Knee mod TTP, severe pain with ROM  Large knee effusion, e/o tophi  Sens DPN, SPN, TN intact  Motor EHL, ext, flex, evers 5/5  DP 2+, 4+ pitting edema  Skin:    General: Skin is warm and dry.  Neurological:     Mental Status: He is alert.  Psychiatric:        Mood and Affect: Mood normal.        Behavior: Behavior normal.    Assessment/Plan: Left knee effusion -- Will aspirate and inject.    Lisette Abu, PA-C Orthopedic Surgery [phone] 11/18/2021, 1:34 PM

## 2021-11-18 NOTE — TOC Initial Note (Signed)
Transition of Care [facility]) - Initial/Assessment Note    Patient Details  Name: Brad [name] MRN: [id number] Date of Birth: [birth year]  [facility] CM/SW Contact:    Brad Collet, RN Phone Number: 11/18/2021, 2:09 PM  Clinical Narrative:                Brad [name] w patient at the bedside. We discussed his gout and how it effects his IADLS. 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 He walks and uses crutches or a walker at home if he needs them. He was not interested in working with PT and states that he walks fine and just does not need it. He shares that when a lot people get around on golf carts he still prefers to walk. He has a PCP and denies any difficulties with obtaining meds.  No TOC needs anticipated at this time for DC   Expected Discharge Plan: Home/Self Care Barriers to Discharge: Continued Medical Work up   Patient Goals and CMS Choice Patient states their goals for this hospitalization and ongoing recovery are:: to return home   Choice offered to / list presented to : NA  Expected Discharge Plan and Services Expected Discharge Plan: Home/Self Care       Living arrangements for the past 2 months: [place]                                      Prior Living Arrangements/Services Living arrangements for the past 2 months: Single Family Home Lives with:: Self              Current home services: DME    Activities of Daily Living Home Assistive Devices/Equipment: Environmental consultant (specify type), Crutches, Eyeglasses ADL Screening (condition at time of admission) Patient's cognitive ability adequate to safely complete daily activities?: Yes Is the patient deaf or have difficulty hearing?: No Does the patient have difficulty seeing, even when wearing glasses/contacts?: No Does the patient have difficulty concentrating, remembering, or making decisions?: No Patient able to express need for assistance with ADLs?: Yes Does the  patient have difficulty dressing or bathing?: Yes Independently performs ADLs?: No Does the patient have difficulty walking or climbing stairs?: Yes Weakness of Legs: Left Weakness of Arms/Hands: None  Permission Sought/Granted                  Emotional Assessment              Admission diagnosis:  Leg pain [M79.606] Left leg pain [M79.605] Patient Active Problem List   Diagnosis Date Noted   Edema of left lower extremity 11/18/2021   Thrombocytosis 11/13/2021   Leukocytosis 11/13/2021   Hypertensive kidney disease with stage 4 chronic kidney disease ([place]) 11/13/2021   Steroid-induced hyperglycemia 11/13/2021   Paroxysmal atrial fibrillation ([facility]) 04/02/2021   Chronic kidney disease, stage IV (severe) ([place]) 04/02/2021   Vitamin B 12 deficiency 01/31/2021   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

## 2021-11-18 NOTE — H&P (Signed)
History and Physical    Brad [name] ZOX:[id number] DOB: 1955/08/18 DOA: 11/17/2021  PCP: Ladell Pier, MD  Patient coming from: Home  Chief Complaint: Left leg pain swelling  HPI: Brad [name] is a 66 y.o. 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  Vital signs stable.  Labs significant for WBC 25.5, hemoglobin 8.4 (stable), platelet count 794k (stable).  Sodium 132, potassium 4.2, chloride 106, bicarb 15, BUN 39, creatinine 2.3 (stable), glucose 188.  ESR 123, CRP 11.5.  Uric acid 10.2 (stable since labs done 2 weeks ago).  Blood cultures drawn.  X-ray of left knee showing diffuse soft tissue swelling, large joint effusion, and no acute or destructive bony lesions.  X-ray of left ankle/foot showing severe soft tissue swelling and no acute bony abnormality.  Left lower extremity Doppler pending to rule out DVT. Patient was given Zofran, vancomycin, and morphine.  Patient reports 2-week history of left leg swelling.  States he does not feel any pain in the leg when resting but only feels it when he gets up to walk due to his leg being so swollen.  Reports history of chronic tophaceous gout for which he is on Uloric and a prednisone taper but the swelling has not improved.  He has been taking prednisone for the past 1 month.  He denies history of blood clots.  No other complaints.  Denies fevers, chills, cough, shortness of breath, chest pain, nausea, vomiting, abdominal pain, diarrhea, or any urinary symptoms.  Review of Systems:  Review of Systems  All other systems reviewed and are negative.  Past Medical History:  Diagnosis Date   Gout    Hypertension     History reviewed. No pertinent surgical history.   reports that he has been smoking  cigarettes. He has a 3.60 pack-year smoking history. He has never used smokeless tobacco. He reports current alcohol use of about 3.0 standard drinks per week. He reports that he does not use drugs.  No Known Allergies  Family History  Problem Relation Age of Onset   Diabetes Mother     Prior to Admission medications   Medication Sig Start Date End Date Taking? Authorizing Provider  amLODipine (NORVASC) 5 MG tablet Take 1.5 tablets (7.5 mg total) by mouth daily. Patient taking differently: Take 5 mg by mouth daily. 11/13/21  Yes Ladell Pier, MD  apixaban (ELIQUIS) 5 MG TABS tablet Take 1 tablet (5 mg total) by mouth 2 (two) times daily. 04/02/21  Yes Jerline Pain, MD  [facility] VITAMIN B12 1000 MCG tablet TAKE 1 TABLET BY MOUTH EVERY DAY Patient taking differently: Take 500 mcg by mouth daily. 06/28/21  Yes Orson Slick, MD  febuxostat (ULORIC) 40 MG tablet Take 1 tablet (40 mg total) by mouth daily. 10/30/21  Yes Rice, Resa Miner, MD  predniSONE (DELTASONE) 10 MG tablet Take by mouth once daily 6, 5, 4, 3, 2, then 1 tablet daily until follow up Patient taking differently: Take 10 mg by mouth See admin instructions. 6,5,4,3,2,1 11/13/21  Yes Rice, Resa Miner, MD  sodium bicarbonate 325 MG tablet Take 1 tablet (325 mg total) by mouth 2 (two) times daily. Patient not taking: Reported on 11/18/2021 11/13/21   Ladell Pier, MD  sodium zirconium cyclosilicate (LOKELMA) 5 g packet Take 5 g  by mouth daily. Patient not taking: Reported on 11/18/2021 11/09/21   Ladell Pier, MD    Physical Exam: Vitals:   11/18/21 0205 11/18/21 0230 11/18/21 0300 11/18/21 0330  BP: (!) 149/90 (!) 149/91 137/86 135/78  Pulse: 80 70 80 81  Resp: 11 (!) _0 Temp:      TempSrc:      SpO2: 98% 100% 100% 99%  Weight:      Height:        Physical Exam Vitals reviewed.  Constitutional:      General: He is not in acute distress. HENT:     Head: Normocephalic and atraumatic.  Eyes:      Extraocular Movements: Extraocular movements intact.     Conjunctiva/sclera: Conjunctivae normal.  Cardiovascular:     Rate and Rhythm: Normal rate and regular rhythm.     Pulses: Normal pulses.  Pulmonary:     Effort: Pulmonary effort is normal. No respiratory distress.     Breath sounds: Normal breath sounds. No wheezing or rales.  Abdominal:     General: Bowel sounds are normal. There is no distension.     Palpations: Abdomen is soft.     Tenderness: There is no abdominal tenderness.  Musculoskeletal:        General: No tenderness.     Cervical back: Normal range of motion.     Left lower leg: Edema present.     Comments: 4+ pitting edema of the left lower extremity  Skin:    General: Skin is warm and dry.  Neurological:     General: No focal deficit present.     Mental Status: He is alert and oriented to person, place, and time.         Labs on Admission: I have personally reviewed following labs and imaging studies  CBC: Recent Labs  Lab 11/17/21 1953  WBC 25.5*  HGB 8.4*  HCT 25.2*  MCV 85.4  PLT 277*   Basic Metabolic Panel: Recent Labs  Lab 11/17/21 1953  NA 132*  K 4.2  CL 106  CO2 15*  GLUCOSE 188*  BUN 39*  CREATININE 2.32*  CALCIUM 8.7*   GFR: Estimated Creatinine Clearance: 29.9 mL/min (A) (by C-G formula based on SCr of 2.32 mg/dL (H)). Liver Function Tests: No results for input(s): AST, ALT, ALKPHOS, BILITOT, PROT, ALBUMIN in the last 168 hours. No results for input(s): LIPASE, AMYLASE in the last 168 hours. No results for input(s): AMMONIA in the last 168 hours. Coagulation Profile: No results for input(s): INR, PROTIME in the last 168 hours. Cardiac Enzymes: No results for input(s): CKTOTAL, CKMB, CKMBINDEX, TROPONINI in the last 168 hours. BNP (last 3 results) No results for input(s): PROBNP in the last 8760 hours. HbA1C: No results for input(s): HGBA1C in the last 72 hours. CBG: No results for input(s): GLUCAP in the last 168  hours. Lipid Profile: No results for input(s): CHOL, HDL, LDLCALC, TRIG, CHOLHDL, LDLDIRECT in the last 72 hours. Thyroid Function Tests: No results for input(s): TSH, T4TOTAL, FREET4, T3FREE, THYROIDAB in the last 72 hours. Anemia Panel: No results for input(s): VITAMINB12, FOLATE, FERRITIN, TIBC, IRON, RETICCTPCT in the last 72 hours. Urine analysis: No results found for: COLORURINE, APPEARANCEUR, LABSPEC, Garvin, GLUCOSEU, HGBUR, BILIRUBINUR, KETONESUR, PROTEINUR, UROBILINOGEN, NITRITE, LEUKOCYTESUR  Radiological Exams on Admission: I have personally reviewed images DG Ankle Complete Left  Result Date: 11/17/2021 CLINICAL DATA:  Pain and swelling. EXAM: LEFT ANKLE COMPLETE - 3+ VIEW COMPARISON:  None Available.  FINDINGS: There is severe soft tissue swelling of the ankle. Rounded density projects from the skin surface measuring 10 x 24 mm along the lateral ankle. There is no radiopaque foreign body. There is no evidence for cortical erosion or focal osseous lesion. No acute fracture or dislocation. Joint spaces are well maintained. IMPRESSION: 1. Severe soft tissue swelling of the ankle. 2. Lateral ankle skin lesion, indeterminate. 3. No acute bony abnormality. Electronically Signed   By: Ronney Asters M.D.   On: 11/17/2021 23:33   DG Knee Complete 4 Views Left  Result Date: 11/17/2021 CLINICAL DATA:  Left leg pain and swelling, history of gout EXAM: LEFT KNEE - COMPLETE 4+ VIEW COMPARISON:  03/20/2021 FINDINGS: Frontal, bilateral oblique, and lateral views of the left knee are obtained. There is diffuse soft tissue swelling. Large joint effusion is noted. 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 No acute fracture, subluxation, or dislocation. No destructive bony lesions. Mild medial compartmental joint space narrowing. IMPRESSION: 1. Diffuse soft tissue swelling. 2. Large joint effusion. 3. No acute or destructive bony lesions. 4. Osteopenia. Electronically  Signed   By: Randa Ngo M.D.   On: 11/17/2021 20:08   DG Foot Complete Left  Result Date: 11/17/2021 CLINICAL DATA:  Pain and swelling. EXAM: LEFT FOOT - COMPLETE 3+ VIEW COMPARISON:  Left foot x-ray 03/20/2021 FINDINGS: There is severe soft tissue swelling over the dorsum of the foot and surrounding the ankle. No radiopaque foreign body. No acute fracture or dislocation. No cortical erosions identified. There is stable cystic degenerative change seen at the first interphalangeal joint. IMPRESSION: 1. Severe soft tissue swelling of the foot and ankle. 2. No acute bony abnormality. 3. Stable cystic degenerative change at the first interphalangeal joint. Electronically Signed   By: Ronney Asters M.D.   On: 11/17/2021 23:35    Assessment and Plan  Left lower extremity edema Chronic tophaceous gout Seen by rheumatology and currently on Uloric and a prednisone taper.  Uric acid elevated but stable since labs done 2 weeks ago.  No acute bony abnormality seen on x-rays of knee, ankle and foot; diffuse soft tissue swelling seen.  He does have a large left knee joint effusion but is not endorsing any knee pain. He was given vancomycin in the ED but has no signs of cellulitis on exam.  Leukocytosis likely due to steroid use.  WBC currently 25.5, was 20.2 on 5/19.  Patient is afebrile, no tachycardia or signs of sepsis.  He appears comfortable and not endorsing any pain at present.  No signs of compartment syndrome.  Given unilateral lower extremity edema, unclear whether this is related to gout.  Ultrasound done 2 weeks ago was negative for DVT. -Check procalcitonin level -Continue Uloric and prednisone -Repeat Doppler pending to rule out DVT  CKD stage IV Chronic metabolic acidosis Renal function stable. -Monitor renal function -Continue bicarb supplement  Chronic anemia Likely due to advanced kidney disease.  Hemoglobin stable. -Continue to monitor  Paroxysmal A-fib -EKG -Continue  Eliquis  Chronic leukocytosis  Likely due to steroid use. -Continue to monitor  Chronic thrombocytosis Platelet count currently stable. -Outpatient hematology follow-up  Hypertension Stable. -Continue amlodipine  DVT prophylaxis: Eliquis Code Status: Full Code (discussed with the patient) Family Communication: No family available at this time. Level of care: Med-Surg Admission status: It is my clinical opinion that referral for OBSERVATION is reasonable and necessary in this patient based on the above information provided. The aforementioned taken together  are felt to place the patient at high risk for further clinical deterioration. However, it is anticipated that the patient may be medically stable for discharge from the hospital within 24 to 48 hours.   Shela Leff MD [facility]  If 7PM-7AM, please contact night-coverage [email or web address]  11/18/2021, 4:22 AM

## 2021-11-18 NOTE — Telephone Encounter (Signed)
I'm keeping an eye on it.

## 2021-11-18 NOTE — ED Notes (Signed)
ED TO INPATIENT HANDOFF REPORT  ED Nurse Name: Bonney Leitz, RN  S Name/Age/Gender Brad [name] 66 y.o. male Room/Bed: 038C/038C  Code Status   Code Status: Prior  Home/SNF/Other Home Patient oriented to: self, place, time, and situation Is this baseline? Yes   Triage Complete: Triage complete  Chief Complaint Leg pain [P10.258]  Triage Note Pt here from home via [facility] for increased pain and swelling in L leg and foot. Pt saw PCP after last ED visit and was placed on prednisone, which pt states he just started taking today. Hx gout and HTN. 142/82, 65HR, 20RR, 99% RA   Allergies No Known Allergies  Level of Care/Admitting Diagnosis ED Disposition     ED Disposition  Admit   Condition  --   Comment  Hospital Area: [facility] [100100]  Level of Care: Med-Surg [16]  May place patient in observation at [facility] or [place] if equivalent level of care is available:: Yes  Covid Evaluation: Asymptomatic - no recent exposure (last 10 days) testing not required  Diagnosis: Leg pain [527782]  Admitting Physician: Shela Leff [[id number]]  Attending Physician: Shela Leff [[id number]]          B Medical/Surgery History Past Medical History:  Diagnosis Date   Gout    Hypertension    History reviewed. No pertinent surgical history.   A IV Location/Drains/Wounds Patient Lines/Drains/Airways Status     Active Line/Drains/Airways     Name Placement date Placement time Site Days   Peripheral IV 11/17/21 20 G Left Antecubital 11/17/21  2139  Antecubital  1            Intake/Output Last 24 hours No intake or output data in the 24 hours ending 11/18/21 0321  Labs/Imaging Results for orders placed or performed during the hospital encounter of 11/17/21 (from the past 48 hour(s))  CBC     Status: Abnormal   Collection Time: 11/17/21  7:53 PM  Result Value Ref Range   WBC 25.5 (H) 4.0 - 10.5 K/uL   RBC 2.95 (L) 4.22 - 5.81  MIL/uL   Hemoglobin 8.4 (L) 13.0 - 17.0 g/dL   HCT 25.2 (L) 39.0 - 52.0 %   MCV 85.4 80.0 - 100.0 fL   MCH 28.5 26.0 - 34.0 pg   MCHC 33.3 30.0 - 36.0 g/dL   RDW 17.7 (H) 11.5 - 15.5 %   Platelets 794 (H) 150 - 400 K/uL   nRBC 0.0 0.0 - 0.2 %    Comment: Performed at [facility], [address], [place], [place] [address]  Basic metabolic panel     Status: Abnormal   Collection Time: 11/17/21  7:53 PM  Result Value Ref Range   Sodium 132 (L) 135 - 145 mmol/L   Potassium 4.2 3.5 - 5.1 mmol/L   Chloride 106 98 - 111 mmol/L   CO2 15 (L) 22 - 32 mmol/L   Glucose, Bld 188 (H) 70 - 99 mg/dL    Comment: Glucose reference range applies only to samples taken after fasting for at least 8 hours.   BUN 39 (H) 8 - 23 mg/dL   Creatinine, Ser 2.32 (H) 0.61 - 1.24 mg/dL   Calcium 8.7 (L) 8.9 - 10.3 mg/dL   GFR, Estimated 31 (L) >60 mL/min    Comment: (NOTE) Calculated using the CKD-EPI Creatinine Equation (2021)    Anion gap 11 5 - 15    Comment: Performed at [facility]  [address] [place], [place] [address]  Sedimentation rate     Status: Abnormal   Collection Time: 11/17/21  7:53 PM  Result Value Ref Range   Sed Rate 123 (H) 0 - 16 mm/hr    Comment: Performed at [facility] [address] [place], [place] [address]  Uric acid     Status: Abnormal   Collection Time: 11/17/21  7:53 PM  Result Value Ref Range   Uric Acid, Serum 10.2 (H) 3.7 - 8.6 mg/dL    Comment: Performed at [facility] [address] [place], [place] [address]  C-reactive protein     Status: Abnormal   Collection Time: 11/17/21 11:30 PM  Result Value Ref Range   CRP 11.5 (H) <1.0 mg/dL    Comment: Performed at [facility] [address] [place], [place] [address]   DG Ankle Complete Left  Result Date: 11/17/2021 CLINICAL DATA:  Pain and swelling. EXAM: LEFT ANKLE COMPLETE - 3+ VIEW COMPARISON:  None Available. FINDINGS: There is severe soft tissue swelling of the ankle.  Rounded density projects from the skin surface measuring 10 x 24 mm along the lateral ankle. There is no radiopaque foreign body. There is no evidence for cortical erosion or focal osseous lesion. No acute fracture or dislocation. Joint spaces are well maintained. IMPRESSION: 1. Severe soft tissue swelling of the ankle. 2. Lateral ankle skin lesion, indeterminate. 3. No acute bony abnormality. Electronically Signed   By: Ronney Asters M.D.   On: 11/17/2021 23:33   DG Knee Complete 4 Views Left  Result Date: 11/17/2021 CLINICAL DATA:  Left leg pain and swelling, history of gout EXAM: LEFT KNEE - COMPLETE 4+ VIEW COMPARISON:  03/20/2021 FINDINGS: Frontal, bilateral oblique, and lateral views of the left knee are obtained. There is diffuse soft tissue swelling. Large joint effusion is noted. 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 No acute fracture, subluxation, or dislocation. No destructive bony lesions. Mild medial compartmental joint space narrowing. IMPRESSION: 1. Diffuse soft tissue swelling. 2. Large joint effusion. 3. No acute or destructive bony lesions. 4. Osteopenia. Electronically Signed   By: Randa Ngo M.D.   On: 11/17/2021 20:08   DG Foot Complete Left  Result Date: 11/17/2021 CLINICAL DATA:  Pain and swelling. EXAM: LEFT FOOT - COMPLETE 3+ VIEW COMPARISON:  Left foot x-ray 03/20/2021 FINDINGS: There is severe soft tissue swelling over the dorsum of the foot and surrounding the ankle. No radiopaque foreign body. No acute fracture or dislocation. No cortical erosions identified. There is stable cystic degenerative change seen at the first interphalangeal joint. IMPRESSION: 1. Severe soft tissue swelling of the foot and ankle. 2. No acute bony abnormality. 3. Stable cystic degenerative change at the first interphalangeal joint. Electronically Signed   By: Ronney Asters M.D.   On: 11/17/2021 23:35    Pending Labs Unresulted Labs (From admission, onward)      Start     Ordered   11/17/21 2301  Culture, blood (routine x 2)  BLOOD CULTURE X 2,   R      11/17/21 2301            Vitals/Pain Today's Vitals   11/18/21 0135 11/18/21 0140 11/18/21 0149 11/18/21 0205  BP:    (!) 149/90  Pulse: 77 76  80  Resp: (!) 22 16  11   Temp:      TempSrc:      SpO2: 100% 99%  98%  Weight:      Height:      PainSc:   4      Isolation Precautions No active isolations  Medications Medications  morphine (PF) 4 MG/ML injection 4 mg (4 mg Intravenous Given 11/18/21 0128)  ondansetron (ZOFRAN) injection 4 mg (4 mg Intravenous Given 11/18/21 0128)  vancomycin (VANCOREADY) IVPB 1250 mg/250 mL (0 mg Intravenous Stopped 11/18/21 0300)    Mobility non-ambulatory Low fall risk   Focused Assessments  Does the Patient currently have chest pain? No    R Recommendations: See Admitting Provider Note  Report given to:   Additional Notes: Pt is very pleasant :)

## 2021-11-18 NOTE — Progress Notes (Signed)
[facility] consulted to order one time dose of aranesp due to anemia from CKD4. Hgb 6.8. Iron 13, t sat 7.   Plan: - Aranesp 73mcg x1 SQ - Recommend to give iron replacement to Dr. Tana Coast if not concern for active infection - Dr. Tana Coast to wait on IV iron for now and will address prior to discharge   Cristela Felt, PharmD, BCPS Clinical Pharmacist 11/18/2021 4:38 PM

## 2021-11-18 NOTE — Procedures (Signed)
Procedure: Left knee aspiration and injection   Indication: Left knee effusion(s)   Surgeon: Silvestre Gunner, PA-C   Assist: None   Anesthesia: Topical refrigerant   EBL: None   Complications: None   Findings: After risks/benefits explained patient desires to undergo procedure. Consent obtained and time out performed. The left knee was sterilely prepped and aspirated. 29ml tophaceous yellow fluid obtained. 21ml 0.5% Marcaine and 40mg  depo-medrol instilled. Pt tolerated the procedure well.       Lisette Abu, PA-C Orthopedic Surgery [phone]

## 2021-11-18 NOTE — Progress Notes (Signed)
Initial Nutrition Assessment  DOCUMENTATION CODES:  Severe malnutrition in context of social or environmental circumstances  INTERVENTION:  Liberalize diet to regular with no added salt Ensure Enlive po BID, each supplement provides 350 kcal and 20 grams of protein. MVI with minerals daily  NUTRITION DIAGNOSIS:  Severe Malnutrition (in the context of social/environmental circumstances) related to  (low intake of nutrient dense foods) as evidenced by severe fat depletion, severe muscle depletion.  GOAL:  Patient will meet greater than or equal to 90% of their needs  MONITOR:  PO intake, Supplement acceptance, I & O's  REASON FOR ASSESSMENT:  Malnutrition Screening Tool    ASSESSMENT:  Pt with hx of HTN, CKD4, and gout presented to ED with leg swelling and pain x 2 weeks. Did not respond to outpatient treatment.  Pt resting in bed at the time of assessment. Discussed recent intake and weight. Pt states that his usual weight is 145-155 lbs but he think he has lost weight in the last 2-3 weeks since his leg has been swollen. Pt reports that he has not been able to get around and cook much over the last few weeks because his leg has been so swollen and sore. Significant muscle and fat deficits present on exam suggestive of long-term poor nutrition.  Discussed intake prior to the swelling. Pt reports that he normally has a fast food biscuit for breakfast on his way to work (if he has enough time he will make this at home), has a sandwich for lunch from home, and then for dinner will either grab some take out or will make something at home (meat with sides). Pt states that he used to drink a lot of alcohol but then cut back and stopped drinking beer and liquor because he was told it could worsen his gout. Pt states that he did switch to wine coolers after this and would drink those with his meals. "Recently" he stopped drinking the wine coolers and is drinking more water.   Pt does not like  milk, but is agreeable to trying the ensure supplements. Discussed his muscle loss and the need to restore any weight that has lost over the last two weeks of decreased intake. Will also liberalize diet in the setting of malnutrition.     Nutritionally Relevant Medications: Scheduled Meds:  febuxostat  40 mg Oral Daily   Ensure Enlive  237 mL Oral BID BM   predniSONE  10 mg Oral Q breakfast   Labs Reviewed: Sodium 134 BUN 39, creatinine 2.14 Hgb 6.8  NUTRITION - FOCUSED PHYSICAL EXAM: Flowsheet Row Most Recent Value  Orbital Region Moderate depletion  Upper Arm Region Severe depletion  Thoracic and Lumbar Region Severe depletion  Buccal Region Moderate depletion  Temple Region Mild depletion  Clavicle Bone Region Severe depletion  Clavicle and Acromion Bone Region Severe depletion  Scapular Bone Region Moderate depletion  Dorsal Hand Severe depletion  Patellar Region Moderate depletion  Anterior Thigh Region Moderate depletion  Posterior Calf Region Moderate depletion  Edema (RD Assessment) Severe  [left leg from ankle to thigh]  Hair Reviewed  Eyes Reviewed  Mouth Reviewed  [limited dentition]  Skin Reviewed  Nails Reviewed   Diet Order:   Diet Order             Diet regular Room service appropriate? Yes; Fluid consistency: Thin  Diet effective now                   EDUCATION NEEDS:  Education needs have been addressed  Skin:  Skin Assessment: Reviewed RN Assessment  Last BM:  prior to admission  Height:  Ht Readings from Last 1 Encounters:  11/18/21 6' (1.829 m)   Weight:  Wt Readings from Last 1 Encounters:  11/18/21 68.4 kg    Ideal Body Weight:  80.9 kg  BMI:  Body mass index is 20.47 kg/m.  Estimated Nutritional Needs:  Kcal:  2000-2200 kcal/d Protein:  100-110g/d Fluid:  >/=2L/d   Ranell Patrick, RD, LDN Clinical Dietitian RD pager # available in [place]  After hours/weekend pager # available in [facility]

## 2021-11-18 NOTE — Progress Notes (Signed)
Left lower extremity venous duplex completed. Refer to "CV Proc" under chart review to view preliminary results.  11/18/2021 2:26 PM Kelby Aline., MHA, RVT, RDCS, RDMS

## 2021-11-19 DIAGNOSIS — M79606 Pain in leg, unspecified: Secondary | ICD-10-CM | POA: Diagnosis present

## 2021-11-19 DIAGNOSIS — M1A9XX1 Chronic gout, unspecified, with tophus (tophi): Secondary | ICD-10-CM

## 2021-11-19 LAB — BPAM RBC
Blood Product Expiration Date: 202306072359
ISSUE DATE / TIME: 202305310929
Unit Type and Rh: 5100

## 2021-11-19 LAB — TYPE AND SCREEN
ABO/RH(D): O POS
Antibody Screen: NEGATIVE
Unit division: 0

## 2021-11-19 LAB — CBC
HCT: 22.7 % — ABNORMAL LOW (ref 39.0–52.0)
Hemoglobin: 7.5 g/dL — ABNORMAL LOW (ref 13.0–17.0)
MCH: 28 pg (ref 26.0–34.0)
MCHC: 33 g/dL (ref 30.0–36.0)
MCV: 84.7 fL (ref 80.0–100.0)
Platelets: 628 10*3/uL — ABNORMAL HIGH (ref 150–400)
RBC: 2.68 MIL/uL — ABNORMAL LOW (ref 4.22–5.81)
RDW: 17.3 % — ABNORMAL HIGH (ref 11.5–15.5)
WBC: 19.8 10*3/uL — ABNORMAL HIGH (ref 4.0–10.5)
nRBC: 0 % (ref 0.0–0.2)

## 2021-11-19 LAB — BASIC METABOLIC PANEL
Anion gap: 7 (ref 5–15)
BUN: 44 mg/dL — ABNORMAL HIGH (ref 8–23)
CO2: 16 mmol/L — ABNORMAL LOW (ref 22–32)
Calcium: 8.5 mg/dL — ABNORMAL LOW (ref 8.9–10.3)
Chloride: 111 mmol/L (ref 98–111)
Creatinine, Ser: 2.43 mg/dL — ABNORMAL HIGH (ref 0.61–1.24)
GFR, Estimated: 29 mL/min — ABNORMAL LOW (ref >60)
Glucose, Bld: 189 mg/dL — ABNORMAL HIGH (ref 70–99)
Potassium: 5.1 mmol/L (ref 3.5–5.1)
Sodium: 134 mmol/L — ABNORMAL LOW (ref 135–145)

## 2021-11-19 LAB — PROCALCITONIN: Procalcitonin: 0.14 ng/mL

## 2021-11-19 MED ORDER — SODIUM CHLORIDE 0.9 % IV SOLN
510.0000 mg | Freq: Once | INTRAVENOUS | Status: AC
  Administered 2021-11-20: 510 mg via INTRAVENOUS
  Filled 2021-11-19: qty 17

## 2021-11-19 MED ORDER — POLYETHYLENE GLYCOL 3350 17 G PO PACK
17.0000 g | PACK | Freq: Every day | ORAL | Status: DC | PRN
  Administered 2021-11-19: 17 g via ORAL
  Filled 2021-11-19: qty 1

## 2021-11-19 MED ORDER — POLYETHYLENE GLYCOL 3350 17 G PO PACK
17.0000 g | PACK | Freq: Every day | ORAL | Status: DC
Start: 2021-11-19 — End: 2021-11-19

## 2021-11-19 MED ORDER — BOOST / RESOURCE BREEZE PO LIQD CUSTOM
1.0000 | Freq: Three times a day (TID) | ORAL | Status: DC
  Administered 2021-11-19 – 2021-11-20 (×2): 1 via ORAL

## 2021-11-19 MED ORDER — DOCUSATE SODIUM 100 MG PO CAPS
100.0000 mg | ORAL_CAPSULE | Freq: Two times a day (BID) | ORAL | Status: DC
  Administered 2021-11-19 – 2021-11-20 (×3): 100 mg via ORAL
  Filled 2021-11-19 (×3): qty 1

## 2021-11-19 MED ORDER — COLCHICINE 0.3 MG HALF TABLET
0.3000 mg | ORAL_TABLET | Freq: Every day | ORAL | Status: DC
Start: 2021-11-20 — End: 2021-11-20
  Administered 2021-11-20: 0.3 mg via ORAL
  Filled 2021-11-19: qty 1

## 2021-11-19 NOTE — Progress Notes (Signed)
[facility]                                                                              Brad [name], is a 66 y.o. 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 Patient reports 2-week history of left leg swelling.  States he does not feel any pain in the leg when resting but only feels it when he gets up to walk due to his leg being so swollen.  Reports history of chronic tophaceous gout for which he is on Uloric and a prednisone taper but the swelling has not improved.  He has been taking prednisone for the past 1 month. ESR 123, CRP 11.5.  Uric acid 10.2 (stable since labs done 2 weeks ago). X-ray of left knee showing diffuse soft tissue swelling, large joint effusion, and no acute or destructive bony lesions.  X-ray of left ankle/foot showing severe soft tissue swelling and no acute bony abnormality.   Assessment & Plan    Principal Problem: Edema of left lower extremity Acute on chronic gout  - venous doppler negative for DVT but showed 2.0x2.4x2.3 cm hetergenous area of the left popliteal fossa , recommend further imaging.  - Ortho consulted, left knee aspiration consistent with acute gout - ESR, CRP, uric acid elevated -Continue IV Solu-Medrol, colchicine, febuxostat - NSAIDS's contraindicated due to AKI -Patient feels a whole lot improved today, was able to ambulate.  Edema improving - will decrease colchicine per his renal function   Active Problems: Progressive knee pain with effusion  - venous doppler  negative for DVT but showed 2.0x 2.4x 2.3 cm hetergenous area of the left popliteal fossa, recommend further imaging.  -CT imaging consistent with likely tophi - likely acute gout, s/p aspiration and injection by ortho, appreciate assistance     Essential hypertension - BP stable, continue amodipine     Chronic tophaceous gout - Hold maintenance prednisone, treating acute flare  - continue uloric, outpatient follow-up with rheumatology     Acute on chronic Anemia, from CKD - baseline ~8, Hb 6.8, no bleeding  -Transfuse 1 unit packed RBCs on 5/31, aranesp x1 - will give IV Feraheme    Paroxysmal atrial fibrillation (HCC) - rate controlled, continue eliquis     Chronic kidney disease, stage IV (severe) (HCC) - continue bicarb  - baseline Cr 2.1-3.1  -Continue to monitor renal function, decrease colchicine    Chronic Thrombocytosis - stable   Code Status: full  DVT Prophylaxis:   apixaban (ELIQUIS) tablet 5 mg   Level of Care: Level of care: Med-Surg Family Communication: Updated patient   Disposition Plan:      Remains inpatient  appropriate:  severe edema and acute gout Left LE.  Hopefully DC in next 24 to 48 hours   Procedures:  5/31 Left knee aspiration and injection   Consultants:   Orthopedics   Antimicrobials:   Anti-infectives (From admission, onward)    Start     Dose/Rate Route Frequency Ordered Stop   11/18/21 0100  vancomycin (VANCOREADY) IVPB 1250 mg/250 mL        1,250 mg 166.7 mL/hr over 90 Minutes Intravenous  Once 11/18/21 0059 11/18/21 0300          Medications  amLODipine  5 mg Oral Daily   apixaban  5 mg Oral BID   [START ON 11/20/2021] colchicine  0.3 mg Oral Daily   docusate sodium  100 mg Oral BID   febuxostat  40 mg Oral Daily   feeding supplement  237 mL Oral BID BM   methylPREDNISolone (SOLU-MEDROL) injection  60 mg Intravenous Q24H   multivitamin with minerals  1 tablet Oral Daily      Subjective:   Brad [name] was  seen and examined today.  Feels a lot better today, left lower extremity edema, pain is significantly improving after starting colchicine and Solu-Medrol.  Per patient he was able to ambulate to the bathroom 3 times.   Objective:   Vitals:   11/18/21 2055 11/19/21 0359 11/19/21 0847 11/19/21 1631  BP: 129/75 131/80 130/78 (!) 150/84  Pulse: 61 64 (!) 56 64  Resp: '17 17 17 18  ' Temp: 98.3 F (36.8 C) 97.8 F (36.6 C) 98 F (36.7 C) 98 F (36.7 C)  TempSrc: Oral Oral Oral Oral  SpO2: 99% 100% 98% 100%  Weight:      Height:        Intake/Output Summary (Last 24 hours) at 11/19/2021 1703 Last data filed at 11/19/2021 1633 Gross per 24 hour  Intake 410 ml  Output 300 ml  Net 110 ml     Wt Readings from Last 3 Encounters:  11/18/21 68.4 kg  11/02/21 65.8 kg  10/30/21 65.8 kg   Physical Exam General: Alert and oriented x 3, NAD Cardiovascular: S1 S2 clear, RRR.  Respiratory: CTAB, no wheezing, rales or rhonchi Gastrointestinal: Soft, nontender, nondistended, NBS Ext: 1-2+ pedal edema LLE, improving  Neuro: no new deficits Skin: No rashes Psych: Normal affect and demeanor, alert and oriented x3    Data Reviewed:  I have personally reviewed following labs    CBC Lab Results  Component Value Date   WBC 19.8 (H) 11/19/2021   RBC 2.68 (L) 11/19/2021   HGB 7.5 (L) 11/19/2021   HCT 22.7 (L) 11/19/2021   MCV 84.7 11/19/2021   MCH 28.0 11/19/2021   PLT 628 (H) 11/19/2021   MCHC 33.0 11/19/2021   RDW 17.3 (H) 11/19/2021   LYMPHSABS 1,050 11/06/2021   MONOABS 0.5 06/04/2021   EOSABS 0 (L) 11/06/2021   BASOSABS 40 31/67/4255     Last metabolic panel Lab Results  Component Value Date   NA 134 (L) 11/19/2021   K 5.1 11/19/2021   CL 111 11/19/2021   CO2 16 (L) 11/19/2021   BUN 44 (H) 11/19/2021   CREATININE 2.43 (H) 11/19/2021   GLUCOSE 189 (H) 11/19/2021   GFRNONAA 29 (L) 11/19/2021   GFRAA >60 11/25/2016   CALCIUM 8.5 (L) 11/19/2021   PHOS 6.2 (H) 11/06/2021    PROT 6.7 10/30/2021   ALBUMIN 3.7 06/04/2021   LABGLOB 3.0 01/27/2021   AGRATIO 1.2 01/27/2021   BILITOT 0.2 10/30/2021  ALKPHOS 53 06/04/2021   AST 9 (L) 10/30/2021   ALT 7 (L) 10/30/2021   ANIONGAP 7 11/19/2021    CBG (last 3)  No results for input(s): GLUCAP in the last 72 hours.    Coagulation Profile: No results for input(s): INR, PROTIME in the last 168 hours.   Radiology Studies: I have personally reviewed the imaging studies  DG Ankle Complete Left  Result Date: 11/17/2021 CLINICAL DATA:  Pain and swelling. EXAM: LEFT ANKLE COMPLETE - 3+ VIEW COMPARISON:  None Available. FINDINGS: There is severe soft tissue swelling of the ankle. Rounded density projects from the skin surface measuring 10 x 24 mm along the lateral ankle. There is no radiopaque foreign body. There is no evidence for cortical erosion or focal osseous lesion. No acute fracture or dislocation. Joint spaces are well maintained. IMPRESSION: 1. Severe soft tissue swelling of the ankle. 2. Lateral ankle skin lesion, indeterminate. 3. No acute bony abnormality. Electronically Signed   By: Ronney Asters M.D.   On: 11/17/2021 23:33   CT KNEE LEFT WO CONTRAST  Result Date: 11/18/2021 CLINICAL DATA:  Knee pain. Aggressive feature on x-ray. Popliteal fossa mass noted on venous Doppler. EXAM: CT OF THE LEFT KNEE WITHOUT CONTRAST TECHNIQUE: Multidetector CT imaging of the left knee was performed according to the standard protocol. Multiplanar CT image reconstructions were also generated. RADIATION DOSE REDUCTION: This exam was performed according to the departmental dose-optimization program which includes automated exposure control, adjustment of the mA and/or kV according to patient size and/or use of iterative reconstruction technique. COMPARISON:  None Available. FINDINGS: Bones/Joint/Cartilage There is diffuse osteopenia. No evidence of acute fracture or dislocation. There is moderate size joint effusion with multiple  calcified structures, likely representing loose bodies. There are multiple small foci of gas in suprapatellar joint effusion concerning for infectious/inflammatory process. Geographic peripherally sclerotic lesion in the proximal tibia likely bone infarct. Ligaments Suboptimally assessed by CT. Muscles and Tendons There is dystrophic calcification about the insertion of the patellar tendon. There is also ossification anterior to the patella, likely sequela of chronic bursitis, which may be secondary to gouty arthritis. Soft tissues Subcutaneous soft tissue edema about posterior and medial aspect of the leg. IMPRESSION: 1.  No evidence of fracture or dislocation. 2. Large joint effusion with multiple foci of gas concerning for infectious/inflammatory process. Joint aspiration for further management would be helpful. Clinical correlation is suggested. 3. Multiple calcified bodies in the joint space, which may be loose bodies or sequela of chronic arthritis. 4. Prepatellar calcification as well as advanced enthesopathy of the patellar tendon, which may be secondary to inflammatory arthropathy including gouty arthritis. Electronically Signed   By: Keane Police D.O.   On: 11/18/2021 21:13   DG Knee Complete 4 Views Left  Result Date: 11/17/2021 CLINICAL DATA:  Left leg pain and swelling, history of gout EXAM: LEFT KNEE - COMPLETE 4+ VIEW COMPARISON:  03/20/2021 FINDINGS: Frontal, bilateral oblique, and lateral views of the left knee are obtained. There is diffuse soft tissue swelling. Large joint effusion is noted. 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 No acute fracture, subluxation, or dislocation. No destructive bony lesions. Mild medial compartmental joint space narrowing. IMPRESSION: 1. Diffuse soft tissue swelling. 2. Large joint effusion. 3. No acute or destructive bony lesions. 4. Osteopenia. Electronically Signed   By: Randa Ngo M.D.   On: 11/17/2021 20:08   DG  Foot Complete Left  Result Date: 11/17/2021 CLINICAL DATA:  Pain  and swelling. EXAM: LEFT FOOT - COMPLETE 3+ VIEW COMPARISON:  Left foot x-ray 03/20/2021 FINDINGS: There is severe soft tissue swelling over the dorsum of the foot and surrounding the ankle. No radiopaque foreign body. No acute fracture or dislocation. No cortical erosions identified. There is stable cystic degenerative change seen at the first interphalangeal joint. IMPRESSION: 1. Severe soft tissue swelling of the foot and ankle. 2. No acute bony abnormality. 3. Stable cystic degenerative change at the first interphalangeal joint. Electronically Signed   By: Ronney Asters M.D.   On: 11/17/2021 23:35   VAS [place] LOWER EXTREMITY VENOUS (DVT) (ONLY [facility] & [facility])  Result Date: 11/18/2021  Lower Venous DVT Study Patient Name:  NICKALOUS STINGLEY  Date of Exam:   11/18/2021 Medical Rec #: [id number]       Accession #:    [id number] Date of Birth: 07/04/1955      Patient Gender: M Patient Age:   89 years Exam Location:  [facility] Procedure:      VAS [place] LOWER EXTREMITY VENOUS (DVT) Referring Phys: Wandra Feinstein RATHORE --------------------------------------------------------------------------------  Indications: Left greater than right worsening edema.  Comparison Study: 11/02/2021- negative left lower extremity venous duplex Performing Technologist: Maudry Mayhew MHA, RDMS, RVT, RDCS  Examination Guidelines: A complete evaluation includes B-mode imaging, spectral Doppler, color Doppler, and power Doppler as needed of all accessible portions of each vessel. Bilateral testing is considered an integral part of a complete examination. Limited examinations for reoccurring indications may be performed as noted. The reflux portion of the exam is performed with the patient in reverse Trendelenburg.  +-----+---------------+---------+-----------+----------+--------------+ RIGHTCompressibilityPhasicitySpontaneityPropertiesThrombus Aging  +-----+---------------+---------+-----------+----------+--------------+ CFV  Full           Yes      Yes                                 +-----+---------------+---------+-----------+----------+--------------+   +---------+---------------+---------+-----------+----------+--------------+ LEFT     CompressibilityPhasicitySpontaneityPropertiesThrombus Aging +---------+---------------+---------+-----------+----------+--------------+ CFV      Full           Yes      Yes                                 +---------+---------------+---------+-----------+----------+--------------+ SFJ      Full                                                        +---------+---------------+---------+-----------+----------+--------------+ FV Prox  Full                                                        +---------+---------------+---------+-----------+----------+--------------+ FV Mid   Full                                                        +---------+---------------+---------+-----------+----------+--------------+ FV DistalFull                                                        +---------+---------------+---------+-----------+----------+--------------+  PFV      Full                                                        +---------+---------------+---------+-----------+----------+--------------+ POP      Full           Yes      Yes                                 +---------+---------------+---------+-----------+----------+--------------+ PTV      Full                                                        +---------+---------------+---------+-----------+----------+--------------+ PERO     Full                                                        +---------+---------------+---------+-----------+----------+--------------+     Summary: RIGHT: - No evidence of common femoral vein obstruction.  LEFT: - There is no evidence of deep vein thrombosis in the  lower extremity.  - No cystic structure found in the popliteal fossa. - Heterogenous area of the left poplitea fossa with internal low-resistant vascularity, and poorly defined margins measuring 2.0 x 2.4 x 2.3 cm. Etiology is unknown; recommend further imaging as clinically indicated.  *See table(s) above for measurements and observations. Electronically signed by Harold Barban MD on 11/18/2021 at 7:55:20 PM.    Final        Estill Cotta M.D. [facility] 11/19/2021, 5:03 PM  Available via Epic secure chat 7am-7pm After 7 pm, please refer to night coverage provider listed on amion.

## 2021-11-19 NOTE — Progress Notes (Signed)
Ok to change colchicine to 0.3mg  qday for now due to renal dysfunction. Check Bmet in AM per Dr. Tana Coast.  Onnie Boer, PharmD, BCIDP, AAHIVP, CPP Infectious Disease Pharmacist 11/19/2021 4:35 PM

## 2021-11-20 ENCOUNTER — Telehealth: Payer: Self-pay | Admitting: *Deleted

## 2021-11-20 DIAGNOSIS — I10 Essential (primary) hypertension: Secondary | ICD-10-CM

## 2021-11-20 LAB — CBC
HCT: 27.9 % — ABNORMAL LOW (ref 39.0–52.0)
Hemoglobin: 9.3 g/dL — ABNORMAL LOW (ref 13.0–17.0)
MCH: 28.3 pg (ref 26.0–34.0)
MCHC: 33.3 g/dL (ref 30.0–36.0)
MCV: 84.8 fL (ref 80.0–100.0)
Platelets: 666 10*3/uL — ABNORMAL HIGH (ref 150–400)
RBC: 3.29 MIL/uL — ABNORMAL LOW (ref 4.22–5.81)
RDW: 17.1 % — ABNORMAL HIGH (ref 11.5–15.5)
WBC: 29.5 10*3/uL — ABNORMAL HIGH (ref 4.0–10.5)
nRBC: 0.1 % (ref 0.0–0.2)

## 2021-11-20 LAB — BASIC METABOLIC PANEL
Anion gap: 8 (ref 5–15)
BUN: 56 mg/dL — ABNORMAL HIGH (ref 8–23)
CO2: 16 mmol/L — ABNORMAL LOW (ref 22–32)
Calcium: 9 mg/dL (ref 8.9–10.3)
Chloride: 109 mmol/L (ref 98–111)
Creatinine, Ser: 2.38 mg/dL — ABNORMAL HIGH (ref 0.61–1.24)
GFR, Estimated: 30 mL/min — ABNORMAL LOW (ref >60)
Glucose, Bld: 218 mg/dL — ABNORMAL HIGH (ref 70–99)
Potassium: 5.3 mmol/L — ABNORMAL HIGH (ref 3.5–5.1)
Sodium: 133 mmol/L — ABNORMAL LOW (ref 135–145)

## 2021-11-20 MED ORDER — TRAMADOL HCL 50 MG PO TABS: 50.0000 mg | ORAL_TABLET | Freq: Three times a day (TID) | ORAL | 0 refills | Status: DC | PRN

## 2021-11-20 MED ORDER — PREDNISONE 10 MG PO TABS: ORAL_TABLET | ORAL | 0 refills | Status: DC

## 2021-11-20 MED ORDER — PREDNISONE 50 MG PO TABS
60.0000 mg | ORAL_TABLET | Freq: Once | ORAL | Status: AC
  Administered 2021-11-20: 60 mg via ORAL
  Filled 2021-11-20: qty 1

## 2021-11-20 MED ORDER — COLCHICINE 0.6 MG PO TABS: 0.3000 mg | ORAL_TABLET | Freq: Every day | ORAL | 3 refills | Status: DC

## 2021-11-20 MED ORDER — AMLODIPINE BESYLATE 5 MG PO TABS: 5.0000 mg | ORAL_TABLET | Freq: Every day | ORAL | Status: DC

## 2021-11-20 NOTE — Progress Notes (Signed)
Pt discharged home in stable condition 

## 2021-11-20 NOTE — Discharge Summary (Signed)
Physician Discharge Summary   Patient: Brad [name] MRN: [id number] DOB: 1955/12/15  Admit date:     11/17/2021  Discharge date: 11/20/21  Discharge Physician: Estill Cotta, MD   PCP: Ladell Pier, MD   Recommendations at discharge:   Placed on prednisone 40 mg daily x 3 days, 30 mg daily for 3 days, 20 mg daily for 3 days, 10 mg daily for 3 days then off.  Patient recommended to keep his appointment with his rheumatologist, Dr.Rice on 11/27/2021 Continue colchicine 0.3 mg daily for 7 days   Discharge Diagnoses:    Edema of left lower extremity Severe acute gouty arthritis, LLE   Essential hypertension   Chronic tophaceous gout   Anemia, chronic disease   Paroxysmal atrial fibrillation (HCC)   Chronic kidney disease, stage IV (severe) (HCC)   Thrombocytosis   Protein-calorie malnutrition, severe   Leg pain   Hospital Course: Patient is a 66 year old male with chronic tophaceous gout seen by rheumatology, hypertension, chronic anemia, CKD stage IV, paroxysmal A-fib on Eliquis, chronic leukocytosis and thrombocytosis seen by hematology presented to the ED complaining of left leg pain and swelling. Patient reports 2-week history of left leg swelling.  States he does not feel any pain in the leg when resting but only feels it when he gets up to walk due to his leg being so swollen.  Reports history of chronic tophaceous gout for which he is on Uloric and a prednisone taper but the swelling has not improved.  He has been taking prednisone for the past 1 month. ESR 123, CRP 11.5.  Uric acid 10.2 (stable since labs done 2 weeks ago). X-ray of left knee showing diffuse soft tissue swelling, large joint effusion, and no acute or destructive bony lesions.  X-ray of left ankle/foot showing severe soft tissue swelling and no acute bony abnormality.    Assessment and Plan:  Edema of left lower extremity Acute on chronic gout  - venous doppler negative for DVT but showed 2.0x2.4x2.3 cm  hetergenous area of the left popliteal fossa , recommend further imaging.  - Ortho consulted, left knee aspiration consistent with acute gout - ESR, CRP, uric acid elevated, patient was placed on IV Solu-Medrol, colchicine, febuxostat - NSAIDS's contraindicated due to AKI -Feels back to his baseline, ambulating in the room, edema has significantly improved. -Transition to oral prednisone with taper, continue colchicine for 7 days given severe acute gouty arthritis with flare. -Recommended patient to keep his appointment with his rheumatologist, [name] on June 9    Progressive knee pain with effusion  - venous doppler negative for DVT but showed 2.0x 2.4x 2.3 cm hetergenous area of the left popliteal fossa, recommend further imaging.  -CT imaging consistent with likely tophi - likely acute gout, s/p aspiration and injection by ortho, appreciate assistance       Essential hypertension - BP stable, continue amodipine      Chronic tophaceous gout - Hold maintenance prednisone, treating acute flare  - continue uloric, outpatient follow-up with rheumatology      Acute on chronic Anemia, from CKD - baseline ~8, Hb 6.8, no bleeding  -Transfuse 1 unit packed RBCs on 5/31, aranesp x1 -Received IV Feraheme     Paroxysmal atrial fibrillation (HCC) - rate controlled, continue eliquis      Chronic kidney disease, stage IV (severe) ([place]) - continue bicarb  - baseline Cr 2.1-3.1  -Continue to monitor renal function, decrease colchicine     Chronic Thrombocytosis - stable  Pain control - [employer] Controlled Substance Reporting System database was reviewed. and patient was instructed, not to drive, operate heavy machinery, perform activities at heights, swimming or participation in water activities or provide baby-sitting services while on Pain, Sleep and Anxiety Medications; until their outpatient Physician has advised to do so again. Also recommended to not to take more than  prescribed Pain, Sleep and Anxiety Medications.  Consultants: Orthopedics Procedures performed: Left knee arthrocentesis Disposition: Home Diet recommendation:  Discharge Diet Orders (From admission, onward)     Start     Ordered   11/20/21 0000  Diet - low sodium heart healthy        11/20/21 1154           Cardiac diet DISCHARGE MEDICATION: Allergies as of 11/20/2021   No Known Allergies      Medication List     TAKE these medications    amLODipine 5 MG tablet Commonly known as: NORVASC Take 1 tablet (5 mg total) by mouth daily.   apixaban 5 MG Tabs tablet Commonly known as: ELIQUIS Take 1 tablet (5 mg total) by mouth 2 (two) times daily.   colchicine 0.6 MG tablet Take 0.5 tablets (0.3 mg total) by mouth daily. Take for 7 days, STOP if experiencing GI upset, diarrhea or the acute gout flare has resolved. Start taking on: November 21, 2021   [employer] VITAMIN B12 1000 MCG tablet Generic drug: cyanocobalamin TAKE 1 TABLET BY MOUTH EVERY DAY What changed: how much to take   febuxostat 40 MG tablet Commonly known as: ULORIC Take 1 tablet (40 mg total) by mouth daily.   Lokelma 5 g packet Generic drug: sodium zirconium cyclosilicate Take 5 g by mouth daily.   predniSONE 10 MG tablet Commonly known as: DELTASONE Prednisone dosing: Take  Prednisone 68m (4 tabs) x 3 days, then taper to 373m(3 tabs) x 3 days, then 2061m2 tabs) x 3days, then 91m71m tab) x 3days, then OFF . Start taking on: November 21, 2021 What changed: additional instructions   sodium bicarbonate 325 MG tablet Take 1 tablet (325 mg total) by mouth 2 (two) times daily.   traMADol 50 MG tablet Commonly known as: Ultram Take 1 tablet (50 mg total) by mouth every 8 (eight) hours as needed for moderate pain or severe pain.        Follow-up Information     JohnLadell Pier. Schedule an appointment as soon as possible for a visit in 2 week(s).   Specialty: Internal Medicine Why: for hospital  follow-up Contact information: [address] [phone]         RiceCollier [place]. Schedule an appointment as soon as possible for a visit in 10 day(s).   Specialty: Rheumatology Why: keep your follow-up appt on June 9th, for hospital follow-up Contact information: [phone]            Discharge Exam: FileDanley Dankerghts   11/18/21 0030 11/18/21 0443  Weight: 65.8 kg 68.4 kg   S: Feels better, wants to go home, ambulating in the room.  Swelling left leg has significantly improved.  Vitals:   11/19/21 1631 11/19/21 1933 11/20/21 0448 11/20/21 1028  BP: (!) 150/84 131/75 135/78 135/90  Pulse: 64 63 (!) 57 63  Resp: _0 Temp: 98 F (36.7 C) 98.3 F (36.8 C) 98.2 F (36.8 C) 98.3  F (36.8 C)  TempSrc: Oral Oral Oral   SpO2: 100% 100% 100% 100%  Weight:      Height:        Physical Exam General: Alert and oriented x 3, NAD Cardiovascular: S1 S2 clear, RRR. No pedal edema b/l Respiratory: CTAB, no wheezing, rales or rhonchi Gastrointestinal: Soft, nontender, nondistended, NBS Ext: left lower leg swelling improving Neuro: no new deficits   Condition at discharge: fair  The results of significant diagnostics from this hospitalization (including imaging, microbiology, ancillary and laboratory) are listed below for reference.   Imaging Studies: DG Ankle Complete Left  Result Date: 11/17/2021 CLINICAL DATA:  Pain and swelling. EXAM: LEFT ANKLE COMPLETE - 3+ VIEW COMPARISON:  None Available. FINDINGS: There is severe soft tissue swelling of the ankle. Rounded density projects from the skin surface measuring 10 x 24 mm along the lateral ankle. There is no radiopaque foreign body. There is no evidence for cortical erosion or focal osseous lesion. No acute fracture or dislocation. Joint spaces are well maintained. IMPRESSION: 1. Severe soft tissue swelling of the  ankle. 2. Lateral ankle skin lesion, indeterminate. 3. No acute bony abnormality. Electronically Signed   By: Ronney Asters M.D.   On: 11/17/2021 23:33   CT KNEE LEFT WO CONTRAST  Result Date: 11/18/2021 CLINICAL DATA:  Knee pain. Aggressive feature on x-ray. Popliteal fossa mass noted on venous Doppler. EXAM: CT OF THE LEFT KNEE WITHOUT CONTRAST TECHNIQUE: Multidetector CT imaging of the left knee was performed according to the standard protocol. Multiplanar CT image reconstructions were also generated. RADIATION DOSE REDUCTION: This exam was performed according to the departmental dose-optimization program which includes automated exposure control, adjustment of the mA and/or kV according to patient size and/or use of iterative reconstruction technique. COMPARISON:  None Available. FINDINGS: Bones/Joint/Cartilage There is diffuse osteopenia. No evidence of acute fracture or dislocation. There is moderate size joint effusion with multiple calcified structures, likely representing loose bodies. There are multiple small foci of gas in suprapatellar joint effusion concerning for infectious/inflammatory process. Geographic peripherally sclerotic lesion in the proximal tibia likely bone infarct. Ligaments Suboptimally assessed by CT. Muscles and Tendons There is dystrophic calcification about the insertion of the patellar tendon. There is also ossification anterior to the patella, likely sequela of chronic bursitis, which may be secondary to gouty arthritis. Soft tissues Subcutaneous soft tissue edema about posterior and medial aspect of the leg. IMPRESSION: 1.  No evidence of fracture or dislocation. 2. Large joint effusion with multiple foci of gas concerning for infectious/inflammatory process. Joint aspiration for further management would be helpful. Clinical correlation is suggested. 3. Multiple calcified bodies in the joint space, which may be loose bodies or sequela of chronic arthritis. 4. Prepatellar  calcification as well as advanced enthesopathy of the patellar tendon, which may be secondary to inflammatory arthropathy including gouty arthritis. Electronically Signed   By: Keane Police D.O.   On: 11/18/2021 21:13   DG Knee Complete 4 Views Left  Result Date: 11/17/2021 CLINICAL DATA:  Left leg pain and swelling, history of gout EXAM: LEFT KNEE - COMPLETE 4+ VIEW COMPARISON:  03/20/2021 FINDINGS: Frontal, bilateral oblique, and lateral views of the left knee are obtained. There is diffuse soft tissue swelling. Large joint effusion is noted. 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 No acute fracture, subluxation, or dislocation. No destructive bony lesions. Mild medial compartmental joint space narrowing. IMPRESSION: 1. Diffuse soft tissue swelling. 2. Large joint effusion. 3. No  acute or destructive bony lesions. 4. Osteopenia. Electronically Signed   By: Randa Ngo M.D.   On: 11/17/2021 20:08   DG Foot Complete Left  Result Date: 11/17/2021 CLINICAL DATA:  Pain and swelling. EXAM: LEFT FOOT - COMPLETE 3+ VIEW COMPARISON:  Left foot x-ray 03/20/2021 FINDINGS: There is severe soft tissue swelling over the dorsum of the foot and surrounding the ankle. No radiopaque foreign body. No acute fracture or dislocation. No cortical erosions identified. There is stable cystic degenerative change seen at the first interphalangeal joint. IMPRESSION: 1. Severe soft tissue swelling of the foot and ankle. 2. No acute bony abnormality. 3. Stable cystic degenerative change at the first interphalangeal joint. Electronically Signed   By: Ronney Asters M.D.   On: 11/17/2021 23:35   VAS [place] LOWER EXTREMITY VENOUS (DVT) (ONLY [facility] & [facility])  Result Date: 11/18/2021  Lower Venous DVT Study Patient Name:  Brad [name]  Date of Exam:   11/18/2021 Medical Rec #: [id number]       Accession #:    [id number] Date of Birth: 02-01-1956      Patient Gender: M Patient Age:   66 years Exam Location:   [facility] Procedure:      VAS [place] LOWER EXTREMITY VENOUS (DVT) Referring Phys: Wandra Feinstein RATHORE --------------------------------------------------------------------------------  Indications: Left greater than right worsening edema.  Comparison Study: 11/02/2021- negative left lower extremity venous duplex Performing Technologist: Maudry Mayhew MHA, RDMS, RVT, RDCS  Examination Guidelines: A complete evaluation includes B-mode imaging, spectral Doppler, color Doppler, and power Doppler as needed of all accessible portions of each vessel. Bilateral testing is considered an integral part of a complete examination. Limited examinations for reoccurring indications may be performed as noted. The reflux portion of the exam is performed with the patient in reverse Trendelenburg.  +-----+---------------+---------+-----------+----------+--------------+ RIGHTCompressibilityPhasicitySpontaneityPropertiesThrombus Aging +-----+---------------+---------+-----------+----------+--------------+ CFV  Full           Yes      Yes                                 +-----+---------------+---------+-----------+----------+--------------+   +---------+---------------+---------+-----------+----------+--------------+ LEFT     CompressibilityPhasicitySpontaneityPropertiesThrombus Aging +---------+---------------+---------+-----------+----------+--------------+ CFV      Full           Yes      Yes                                 +---------+---------------+---------+-----------+----------+--------------+ SFJ      Full                                                        +---------+---------------+---------+-----------+----------+--------------+ FV Prox  Full                                                        +---------+---------------+---------+-----------+----------+--------------+ FV Mid   Full                                                         +---------+---------------+---------+-----------+----------+--------------+  FV DistalFull                                                        +---------+---------------+---------+-----------+----------+--------------+ PFV      Full                                                        +---------+---------------+---------+-----------+----------+--------------+ POP      Full           Yes      Yes                                 +---------+---------------+---------+-----------+----------+--------------+ PTV      Full                                                        +---------+---------------+---------+-----------+----------+--------------+ PERO     Full                                                        +---------+---------------+---------+-----------+----------+--------------+     Summary: RIGHT: - No evidence of common femoral vein obstruction.  LEFT: - There is no evidence of deep vein thrombosis in the lower extremity.  - No cystic structure found in the popliteal fossa. - Heterogenous area of the left poplitea fossa with internal low-resistant vascularity, and poorly defined margins measuring 2.0 x 2.4 x 2.3 cm. Etiology is unknown; recommend further imaging as clinically indicated.  *See table(s) above for measurements and observations. Electronically signed by Harold Barban MD on 11/18/2021 at 7:55:20 PM.    Final    VAS [place] LOWER EXTREMITY VENOUS (DVT) (7a-7p)  Result Date: 11/02/2021  Lower Venous DVT Study Patient Name:  Brad [name]  Date of Exam:   11/02/2021 Medical Rec #: [id number]       Accession #:    [id number] Date of Birth: 04-22-1956      Patient Gender: M Patient Age:   31 years Exam Location:  [facility] Procedure:      VAS [place] LOWER EXTREMITY VENOUS (DVT) Referring Phys: Vonzella Nipple BLUE --------------------------------------------------------------------------------  Indications: Pain.  Risk Factors: None identified. Limitations: Poor  ultrasound/tissue interface and patient positioning, patient immobility, patient pain tolerance. Comparison Study: No prior studies. Performing Technologist: Oliver Hum RVT  Examination Guidelines: A complete evaluation includes B-mode imaging, spectral Doppler, color Doppler, and power Doppler as needed of all accessible portions of each vessel. Bilateral testing is considered an integral part of a complete examination. Limited examinations for reoccurring indications may be performed as noted. The reflux portion of the exam is performed with the patient in reverse Trendelenburg.  +-----+---------------+---------+-----------+----------+--------------+ RIGHTCompressibilityPhasicitySpontaneityPropertiesThrombus Aging +-----+---------------+---------+-----------+----------+--------------+ CFV  Full           Yes  Yes                                 +-----+---------------+---------+-----------+----------+--------------+   +---------+---------------+---------+-----------+----------+--------------+ LEFT     CompressibilityPhasicitySpontaneityPropertiesThrombus Aging +---------+---------------+---------+-----------+----------+--------------+ CFV      Full           Yes      Yes                                 +---------+---------------+---------+-----------+----------+--------------+ SFJ      Full                                                        +---------+---------------+---------+-----------+----------+--------------+ FV Prox  Full                                                        +---------+---------------+---------+-----------+----------+--------------+ FV Mid   Full                                                        +---------+---------------+---------+-----------+----------+--------------+ FV DistalFull                                                        +---------+---------------+---------+-----------+----------+--------------+ PFV       Full                                                        +---------+---------------+---------+-----------+----------+--------------+ POP      Full           Yes      Yes                                 +---------+---------------+---------+-----------+----------+--------------+ PTV      Full                                                        +---------+---------------+---------+-----------+----------+--------------+ PERO     Full                                                        +---------+---------------+---------+-----------+----------+--------------+     Summary: RIGHT: - No evidence of  common femoral vein obstruction.  LEFT: - There is no evidence of deep vein thrombosis in the lower extremity. However, portions of this examination were limited- see technologist comments above.  - No cystic structure found in the popliteal fossa.  *See table(s) above for measurements and observations. Electronically signed by Deitra Mayo MD on 11/02/2021 at 4:20:57 PM.    Final     Microbiology: Results for orders placed or performed during the hospital encounter of 11/17/21  Culture, blood (routine x 2)     Status: None (Preliminary result)   Collection Time: 11/18/21 12:14 AM   Specimen: BLOOD  Result Value Ref Range Status   Specimen Description BLOOD RIGHT ANTECUBITAL  Final   Special Requests   Final    BOTTLES DRAWN AEROBIC AND ANAEROBIC Blood Culture results may not be optimal due to an inadequate volume of blood received in culture bottles   Culture   Final    NO GROWTH 2 DAYS Performed at [facility], [place] [address] [place], [place] [address]    Report Status PENDING  Incomplete  Culture, blood (routine x 2)     Status: None (Preliminary result)   Collection Time: 11/18/21 12:20 AM   Specimen: BLOOD RIGHT HAND  Result Value Ref Range Status   Specimen Description BLOOD RIGHT HAND  Final   Special Requests   Final    BOTTLES DRAWN AEROBIC AND  ANAEROBIC Blood Culture results may not be optimal due to an inadequate volume of blood received in culture bottles   Culture   Final    NO GROWTH 2 DAYS Performed at [facility], [place] [address], [place], [place] [address]    Report Status PENDING  Incomplete  Body fluid culture w Gram Stain     Status: None (Preliminary result)   Collection Time: 11/18/21  1:18 PM   Specimen: Synovium; Body Fluid  Result Value Ref Range Status   Specimen Description SYNOVIAL  Final   Special Requests LEFT KNEE  Final   Gram Stain   Final    NO SQUAMOUS EPITHELIAL CELLS SEEN MODERATE WBC SEEN NO ORGANISMS SEEN    Culture   Final    NO GROWTH 2 DAYS Performed at [facility], [address] [place], [place] [address]    Report Status PENDING  Incomplete    Labs: CBC: Recent Labs  Lab 11/17/21 1953 11/18/21 0618 11/19/21 0152 11/20/21 0635  WBC 25.5* 21.9* 19.8* 29.5*  HGB 8.4* 6.8* 7.5* 9.3*  HCT 25.2* 19.6* 22.7* 27.9*  MCV 85.4 83.1 84.7 84.8  PLT 794* 668* 628* 909*   Basic Metabolic Panel: Recent Labs  Lab 11/17/21 1953 11/18/21 0618 11/19/21 0152 11/20/21 0635  NA 132* 134* 134* 133*  K 4.2 4.3 5.1 5.3*  CL 106 109 111 109  CO2 15* 16* 16* 16*  GLUCOSE 188* 191* 189* 218*  BUN 39* 39* 44* 56*  CREATININE 2.32* 2.14* 2.43* 2.38*  CALCIUM 8.7* 8.5* 8.5* 9.0   Liver Function Tests: No results for input(s): AST, ALT, ALKPHOS, BILITOT, PROT, ALBUMIN in the last 168 hours. CBG: No results for input(s): GLUCAP in the last 168 hours.  Discharge time spent: greater than 30 minutes.  Signed: Estill Cotta, MD [facility] 11/20/2021

## 2021-11-20 NOTE — Progress Notes (Signed)
PT Cancellation Note  Patient Details Name: Brad [name] MRN: [id number] DOB: 05-24-1956   Cancelled Treatment:    Reason Eval/Treat Not Completed: Patient declined, no reason specified (Declines need for PT.  Per pt + OT, pt mobilizing mod I w/ RW (baseline function).  OT does states pt has stairs at home and PT offers to assess function on stairs, and pt refuses.  Says he has been using crutches for months and doesn't need to practice.) PT order will be D/C'd at this time, please reconsult should pt's status change.  [name], PT, DPT Acute Rehabilitation Services Office: [place] 11/20/2021, 9:14 AM

## 2021-11-20 NOTE — Telephone Encounter (Signed)
Patient called the office stating he is being discharged from the hospital today. Patient states the doctor at the hospital wants him to be seen for a follow up here in 1 week. Patient has a follow up scheduled for June 9 already. Patient reminded of appointment date and time. Patient states he is going to be sent home on Prednisone 60 mg for 7 days and some pain medication. Patient states he also received a call from the nephrologist yesterday but was unable to talk to them due to having a doctor in the room. Patient states he will update you next week regarding whether or not he will be able to schedule that appointment.

## 2021-11-20 NOTE — Evaluation (Addendum)
Occupational Therapy Evaluation/Discharge Patient Details Name: Brad [name] MRN: [id number] DOB: Feb 18, 1956 Today's Date: 11/20/2021   History of Present Illness Pt is a 66 y/o male presenting with left leg swelling and pain. Xray shows diffuse soft tissue swelling, large joint effusion. Negative for acute fx and DVT. Pt underwent knee aspiration, admitted for further treatment of possible gout. PMH: chronic tophaceous gout, HTN, anemia, CKD, a fib, and chronic leukocytosis   Clinical Impression   PTA, pt lives alone in first floor apartment, typically Independent with mobility with occasional assist with ADLs/IADLs as needed. Pt still working 7 days/week as an Dispensing optician. Pt presents now with continued L LE swelling but reports this is much improved since admission along with pain levels. Overall, pt completing ADLs and in room mobility using RW at a Modified Independent level. Pt able to demo dynamic standing balance tasks with RW without LOB or safety concerns (tub tx, picking up items from floor, etc). Pt eager to return home today. No further skilled OT services needed at acute level or on DC.       Recommendations for follow up therapy are one component of a multi-disciplinary discharge planning process, led by the attending physician.  Recommendations may be updated based on patient status, additional functional criteria and insurance authorization.   Follow Up Recommendations  No OT follow up    Assistance Recommended at Discharge PRN  Patient can return home with the following Assist for transportation;Help with stairs or ramp for entrance    Functional Status Assessment  Patient has had a recent decline in their functional status and demonstrates the ability to make significant improvements in function in a reasonable and predictable amount of time.  Equipment Recommendations  None recommended by OT    Recommendations for Other Services        Precautions / Restrictions Precautions Precautions: None Restrictions Weight Bearing Restrictions: No      Mobility Bed Mobility Overal bed mobility: Modified Independent Bed Mobility: Supine to Sit, Sit to Supine     Supine to sit: Modified independent (Device/Increase time) Sit to supine: Modified independent (Device/Increase time)        Transfers Overall transfer level: Modified independent Equipment used: Rolling walker (2 wheels)                      Balance Overall balance assessment: No apparent balance deficits (not formally assessed)                                         ADL either performed or assessed with clinical judgement   ADL Overall ADL's : Modified independent                                       General ADL Comments: has been mobilizing to/from bathroom using RW without assist and without issue. Pt able to bend to floor to pick up items, simulated stepping over tub, without increased pain or safety concerns. able to carry items with RW around room     Vision Baseline Vision/History: 1 Wears glasses Ability to See in Adequate Light: 0 Adequate Patient Visual Report: No change from baseline Vision Assessment?: No apparent visual deficits     Perception     Praxis      Pertinent  Vitals/Pain Pain Assessment Pain Assessment: No/denies pain     Hand Dominance Right   Extremity/Trunk Assessment Upper Extremity Assessment Upper Extremity Assessment: Overall WFL for tasks assessed   Lower Extremity Assessment Lower Extremity Assessment: Defer to PT evaluation;LLE deficits/detail LLE Deficits / Details: diffuse swelling of LLE   Cervical / Trunk Assessment Cervical / Trunk Assessment: Normal   Communication Communication Communication: No difficulties   Cognition Arousal/Alertness: Awake/alert Behavior During Therapy: WFL for tasks assessed/performed Overall Cognitive Status: Within  Functional Limits for tasks assessed                                       General Comments  Encouraged continued mobility and ROM exercises of L LE to decrease edema    Exercises     Shoulder Instructions      Home Living Family/patient expects to be discharged to:: Private residence Living Arrangements: Alone Available Help at Discharge: Family;Available PRN/intermittently Type of Home: Apartment Home Access: Stairs to enter Entrance Stairs-Number of Steps: 4 Entrance Stairs-Rails: Right Home Layout: One level     Bathroom Shower/Tub: Teacher, early years/pre: Standard     Home Equipment: Crutches;Rollator (4 wheels)          Prior Functioning/Environment Prior Level of Function : Working/employed;Needs assist       Physical Assist : ADLs (physical)   ADLs (physical): Bathing;IADLs Mobility Comments: no use of AD typically for mobility, does use crutch for stair mgmt at apartment. had been using rollator in recent weeks due to LE swelling/pain ADLs Comments: Family assists with transportation, groceries and brother assists with certain aspects of bathing tasks as needed. Pt still working as Dispensing optician, hoping to retire soon        OT Problem List:        OT Treatment/Interventions:      OT Goals(Current goals can be found in the care plan section) Acute Rehab OT Goals Patient Stated Goal: recover, get back to work when ready OT Goal Formulation: All assessment and education complete, DC therapy  OT Frequency:      Co-evaluation              AM-PAC OT "6 Clicks" Daily Activity     Outcome Measure Help from another person eating meals?: None Help from another person taking care of personal grooming?: None Help from another person toileting, which includes using toliet, bedpan, or urinal?: None Help from another person bathing (including washing, rinsing, drying)?: None Help from another person to put  on and taking off regular upper body clothing?: None Help from another person to put on and taking off regular lower body clothing?: None 6 Click Score: 24   End of Session Equipment Utilized During Treatment: Rolling walker (2 wheels) Nurse Communication: Mobility status  Activity Tolerance: Patient tolerated treatment well Patient left: in bed;with call bell/phone within reach  OT Visit Diagnosis: Other abnormalities of gait and mobility (R26.89)                Time: 0711-0729 OT Time Calculation (min): 18 min Charges:  OT General Charges $OT Visit: 1 Visit OT Evaluation $OT Eval Low Complexity: 1 Low  Malachy Chamber, OTR/L Acute Rehab Services Office: [phone]   Layla Maw 11/20/2021, 7:44 AM

## 2021-11-20 NOTE — Telephone Encounter (Signed)
Sounds good, we can see him next week as scheduled or can move it if needed.

## 2021-11-22 LAB — BODY FLUID CULTURE W GRAM STAIN
Culture: NO GROWTH
Gram Stain: NONE SEEN

## 2021-11-23 ENCOUNTER — Telehealth: Payer: Self-pay

## 2021-11-23 LAB — CULTURE, BLOOD (ROUTINE X 2)
Culture: NO GROWTH
Culture: NO GROWTH

## 2021-11-23 NOTE — Telephone Encounter (Signed)
Transition Care Management Follow-up Telephone Call Date of discharge and from where: 11/20/2021, [facility] How have you been since you were released from the hospital? He said he is really feeling pretty good. He said that  the swelling in his leg is decreasing.  Any questions or concerns? No  Items Reviewed: Did the pt receive and understand the discharge instructions provided? Yes  Medications obtained and verified? Yes - he said he has all of his medications and did not have any questions about the med regime  Other? No  Any new allergies since your discharge? No  Dietary orders reviewed? Yes Do you have support at home? Yes - he lives in a boarding house.   Home Care and Equipment/Supplies: Were home health services ordered? no If so, what is the name of the agency? N/a  Has the agency set up a time to come to the patient's home? not applicable Were any new equipment or medical supplies ordered?  No What is the name of the medical supply agency? N/a Were you able to get the supplies/equipment? not applicable Do you have any questions related to the use of the equipment or supplies? No  Functional Questionnaire: (I = Independent and D = Dependent) ADLs: independent. Has walker and crutches to use if needed.   Follow up appointments reviewed:  PCP Hospital f/u appt confirmed? Yes  Scheduled to see Dr [name] - 12/18/2021.  [facility] f/u appt confirmed? Yes  Scheduled to see Dr Benjamine Mola - 11/27/2021.  Are transportation arrangements needed? No  If their condition worsens, is the pt aware to call PCP or go to the Emergency Dept.? Yes Was the patient provided with contact information for the PCP's office or ED? Yes Was to pt encouraged to call back with questions or concerns? Yes

## 2021-11-23 NOTE — Telephone Encounter (Signed)
Pt states during todays 6-5 call w/ Opal Sidles he was asked if he needed anything  Pt states he needs gauze and tape for wound on leg  Pt inquiring if he can come by office today to pick up supplies  Please advise pt further

## 2021-11-23 NOTE — Telephone Encounter (Signed)
Pt made an additional call , please advise.

## 2021-11-24 ENCOUNTER — Ambulatory Visit: Payer: Self-pay | Admitting: Physician Assistant

## 2021-11-24 ENCOUNTER — Encounter: Payer: Self-pay | Admitting: Physician Assistant

## 2021-11-24 DIAGNOSIS — R600 Localized edema: Secondary | ICD-10-CM

## 2021-11-24 NOTE — Telephone Encounter (Signed)
Attempted to contact patient # [phone], voicemail full, unable to leave a message.   He was seen at the Mobile Medical Unit today

## 2021-11-24 NOTE — Progress Notes (Signed)
Established Patient Office Visit  Subjective   Patient ID: Brad [name], male    DOB: 1956-01-24  Age: 66 y.o. MRN: [id number]  Chief Complaint  Patient presents with   Wound Check   Virtual Visit via Telephone Note  I connected with [name] on 11/24/21 at  3:00 PM EDT by telephone and verified that I am speaking with the correct person using two identifiers.  Location: Patient: Car  Provider: [facility]    I discussed the limitations, risks, security and privacy concerns of performing an evaluation and management service by telephone and the availability of in person appointments. I also discussed with the patient that there may be a patient responsible charge related to this service. The patient expressed understanding and agreed to proceed.   History of Present Illness: Patient states that he was recently hospitalized from May 30 through November 20, 2021.    Hospital Course: Patient is a 66 year old male with chronic tophaceous gout seen by rheumatology, hypertension, chronic anemia, CKD stage IV, paroxysmal A-fib on Eliquis, chronic leukocytosis and thrombocytosis seen by hematology presented to the ED complaining of left leg pain and swelling. Patient reports 2-week history of left leg swelling.  States he does not feel any pain in the leg when resting but only feels it when he gets up to walk due to his leg being so swollen.  Reports history of chronic tophaceous gout for which he is on Uloric and a prednisone taper but the swelling has not improved.  He has been taking prednisone for the past 1 month. ESR 123, CRP 11.5.  Uric acid 10.2 (stable since labs done 2 weeks ago). X-ray of left knee showing diffuse soft tissue swelling, large joint effusion, and no acute or destructive bony lesions.  X-ray of left ankle/foot showing severe soft tissue swelling and no acute bony abnormality  States today that he has a hospital follow-up on June 9 with  rheumatology.  States that he did receive supplies to change his wound on his left foot, but requests help doing that.  No other concerns at this time.   Observations/Objective: Medical history and current medications reviewed, no physical exam completed    Past Medical History:  Diagnosis Date   Gout    Hypertension    Social History   Socioeconomic History   Marital status: Single    Spouse name: Not on file   Number of children: Not on file   Years of education: Not on file   Highest education level: Not on file  Occupational History   Not on file  Tobacco Use   Smoking status: Every Day    Packs/day: 0.24    Years: 15.00    Pack years: 3.60    Types: Cigarettes   Smokeless tobacco: Never  Vaping Use   Vaping Use: Never used  Substance and Sexual Activity   Alcohol use: Yes    Alcohol/week: 3.0 standard drinks    Types: 3 Standard drinks or equivalent per week   Drug use: No   Sexual activity: Not on file  Other Topics Concern   Not on file  Social History Narrative   Not on file   Social Determinants of Health   Financial Resource Strain: Not on file  Food Insecurity: Not on file  Transportation Needs: Not on file  Physical Activity: Not on file  Stress: Not on file  Social Connections: Not on file  Intimate Partner Violence: Not on  file   Family History  Problem Relation Age of Onset   Diabetes Mother    No Known Allergies    Review of Systems  Constitutional:  Negative for chills and fever.  HENT: Negative.    Eyes: Negative.   Respiratory:  Negative for shortness of breath.   Cardiovascular:  Negative for chest pain.  Gastrointestinal: Negative.   Genitourinary: Negative.   Musculoskeletal:  Positive for joint pain.  Skin: Negative.   Neurological: Negative.   Endo/Heme/Allergies: Negative.   Psychiatric/Behavioral: Negative.        Assessment & Plan:   Problem List Items Addressed This Visit       Other   Edema of left lower  extremity - Primary   Assessment and Plan: 1. Edema of left lower extremity Nurse went to patient's vehicle to help change dressing.  Superficial wound, healing well.  Patient unable to navigate stairs on mobile unit and unfortunately wheelchair lift was inoperable.  Patient declined any further supplies.   Follow Up Instructions:    I discussed the assessment and treatment plan with the patient. The patient was provided an opportunity to ask questions and all were answered. The patient agreed with the plan and demonstrated an understanding of the instructions.   The patient was advised to call back or seek an in-person evaluation if the symptoms worsen or if the condition fails to improve as anticipated.  I provided 15 minutes of non-face-to-face time during this encounter.  Patient declined AVS.  Chauntae Hults S Mayers, PA-C

## 2021-11-27 ENCOUNTER — Ambulatory Visit (INDEPENDENT_AMBULATORY_CARE_PROVIDER_SITE_OTHER): Payer: No Typology Code available for payment source | Admitting: Internal Medicine

## 2021-11-27 ENCOUNTER — Encounter: Payer: Self-pay | Admitting: Internal Medicine

## 2021-11-27 VITALS — BP 155/75 | HR 88 | Resp 17 | Ht 72.0 in | Wt 160.0 lb

## 2021-11-27 DIAGNOSIS — D638 Anemia in other chronic diseases classified elsewhere: Secondary | ICD-10-CM

## 2021-11-27 DIAGNOSIS — N184 Chronic kidney disease, stage 4 (severe): Secondary | ICD-10-CM

## 2021-11-27 DIAGNOSIS — M1A9XX1 Chronic gout, unspecified, with tophus (tophi): Secondary | ICD-10-CM

## 2021-11-27 DIAGNOSIS — L97521 Non-pressure chronic ulcer of other part of left foot limited to breakdown of skin: Secondary | ICD-10-CM | POA: Insufficient documentation

## 2021-11-27 DIAGNOSIS — Z79899 Other long term (current) drug therapy: Secondary | ICD-10-CM

## 2021-11-30 ENCOUNTER — Other Ambulatory Visit: Payer: Self-pay

## 2021-11-30 ENCOUNTER — Telehealth: Payer: Self-pay | Admitting: Hematology and Oncology

## 2021-11-30 ENCOUNTER — Inpatient Hospital Stay: Payer: Self-pay | Attending: Hematology and Oncology | Admitting: Hematology and Oncology

## 2021-11-30 ENCOUNTER — Inpatient Hospital Stay: Payer: Self-pay

## 2021-11-30 ENCOUNTER — Other Ambulatory Visit: Payer: Self-pay | Admitting: Hematology and Oncology

## 2021-11-30 VITALS — BP 144/95 | HR 84 | Temp 97.7°F | Resp 17 | Wt 161.7 lb

## 2021-11-30 DIAGNOSIS — D649 Anemia, unspecified: Secondary | ICD-10-CM | POA: Insufficient documentation

## 2021-11-30 DIAGNOSIS — F17210 Nicotine dependence, cigarettes, uncomplicated: Secondary | ICD-10-CM | POA: Insufficient documentation

## 2021-11-30 DIAGNOSIS — D75839 Thrombocytosis, unspecified: Secondary | ICD-10-CM | POA: Insufficient documentation

## 2021-11-30 DIAGNOSIS — D72825 Bandemia: Secondary | ICD-10-CM

## 2021-11-30 DIAGNOSIS — D72829 Elevated white blood cell count, unspecified: Secondary | ICD-10-CM | POA: Insufficient documentation

## 2021-11-30 DIAGNOSIS — D638 Anemia in other chronic diseases classified elsewhere: Secondary | ICD-10-CM

## 2021-11-30 DIAGNOSIS — I10 Essential (primary) hypertension: Secondary | ICD-10-CM | POA: Insufficient documentation

## 2021-11-30 LAB — CMP (CANCER CENTER ONLY)
ALT: 8 U/L (ref 0–44)
AST: 9 U/L — ABNORMAL LOW (ref 15–41)
Albumin: 3.4 g/dL — ABNORMAL LOW (ref 3.5–5.0)
Alkaline Phosphatase: 43 U/L (ref 38–126)
Anion gap: 7 (ref 5–15)
BUN: 29 mg/dL — ABNORMAL HIGH (ref 8–23)
CO2: 20 mmol/L — ABNORMAL LOW (ref 22–32)
Calcium: 8.5 mg/dL — ABNORMAL LOW (ref 8.9–10.3)
Chloride: 105 mmol/L (ref 98–111)
Creatinine: 2.04 mg/dL — ABNORMAL HIGH (ref 0.61–1.24)
GFR, Estimated: 36 mL/min — ABNORMAL LOW (ref >60)
Glucose, Bld: 226 mg/dL — ABNORMAL HIGH (ref 70–99)
Potassium: 4.6 mmol/L (ref 3.5–5.1)
Sodium: 132 mmol/L — ABNORMAL LOW (ref 135–145)
Total Bilirubin: 0.4 mg/dL (ref 0.3–1.2)
Total Protein: 6.3 g/dL — ABNORMAL LOW (ref 6.5–8.1)

## 2021-11-30 LAB — FERRITIN: Ferritin: 264 ng/mL (ref 24–336)

## 2021-11-30 LAB — CBC WITH DIFFERENTIAL (CANCER CENTER ONLY)
Abs Immature Granulocytes: 0.09 10*3/uL — ABNORMAL HIGH (ref 0.00–0.07)
Basophils Absolute: 0 10*3/uL (ref 0.0–0.1)
Basophils Relative: 0 %
Eosinophils Absolute: 0 10*3/uL (ref 0.0–0.5)
Eosinophils Relative: 0 %
HCT: 28.3 % — ABNORMAL LOW (ref 39.0–52.0)
Hemoglobin: 9.6 g/dL — ABNORMAL LOW (ref 13.0–17.0)
Immature Granulocytes: 1 %
Lymphocytes Relative: 4 %
Lymphs Abs: 0.5 10*3/uL — ABNORMAL LOW (ref 0.7–4.0)
MCH: 29.3 pg (ref 26.0–34.0)
MCHC: 33.9 g/dL (ref 30.0–36.0)
MCV: 86.3 fL (ref 80.0–100.0)
Monocytes Absolute: 0.5 10*3/uL (ref 0.1–1.0)
Monocytes Relative: 3 %
Neutro Abs: 14.5 10*3/uL — ABNORMAL HIGH (ref 1.7–7.7)
Neutrophils Relative %: 92 %
Platelet Count: 378 10*3/uL (ref 150–400)
RBC: 3.28 MIL/uL — ABNORMAL LOW (ref 4.22–5.81)
RDW: 19.7 % — ABNORMAL HIGH (ref 11.5–15.5)
WBC Count: 15.6 10*3/uL — ABNORMAL HIGH (ref 4.0–10.5)
nRBC: 0 % (ref 0.0–0.2)

## 2021-11-30 LAB — VITAMIN B12: Vitamin B-12: 917 pg/mL — ABNORMAL HIGH (ref 180–914)

## 2021-11-30 LAB — RETIC PANEL
Immature Retic Fract: 16.1 % — ABNORMAL HIGH (ref 2.3–15.9)
RBC.: 3.24 MIL/uL — ABNORMAL LOW (ref 4.22–5.81)
Retic Count, Absolute: 97.8 10*3/uL (ref 19.0–186.0)
Retic Ct Pct: 3 % (ref 0.4–3.1)
Reticulocyte Hemoglobin: 33.5 pg (ref >27.9)

## 2021-11-30 LAB — LACTATE DEHYDROGENASE: LDH: 175 U/L (ref 98–192)

## 2021-11-30 LAB — IRON AND IRON BINDING CAPACITY (CC-WL,HP ONLY)
Iron: 12 ug/dL — ABNORMAL LOW (ref 45–182)
Saturation Ratios: 5 % — ABNORMAL LOW (ref 17.9–39.5)
TIBC: 266 ug/dL (ref 250–450)
UIBC: 254 ug/dL (ref 117–376)

## 2021-11-30 LAB — FOLATE: Folate: 8.3 ng/mL (ref >5.9)

## 2021-11-30 NOTE — Progress Notes (Signed)
[place] [phone]   [phone]) [phone]  PROGRESS NOTE  Patient Care Team: Ladell Pier, MD as PCP - General (Internal Medicine)  Hematological/Oncological History # Leukocytosis/Thrombocytosis # Normocytic Anemia 11/25/2016: WBC 8.6, Hgb 11.5, MCV 91.3, Plt 532 01/27/2021: WBC 13.7, Hgb 8.1, MCV 88, Plt 707 02/13/2021: establish care with [name] 06/04/2021: WBC 8.2, Hgb 10.2, MCV 86.4, Plt 470 11/20/2021: WBC 29.5, Hgb 9.3, MCV 84.8, Plt 666 11/30/2021: WBC 15.6, Hgb 9.6, Plt 378, MCV 86.3.   Interval History:  Brad [name] 66 y.o. male with medical history significant for leukocytosis/thrombocytosis and normocytic anemia who presents for a follow up visit. The patient's last visit was on 06/04/2021. In the interim since the last visit he had a hospitalization from 5/30-11/20/2021 with an acute on chronic gout flare.   On exam today [name] reports reports that his left knee is improving greatly since his discharge from the hospital.  He reports that he had excruciating pain and massive swelling of the left lower extremity but is improving after his hospitalization.  He reports he is currently on steroid taper and is taking 2 tablets daily.  He notes he is "about to finish".  He notes the pain was 8 out of 10 last night but is easing up today.  He reports that is most painful when he is trying to stand up.  He is not having any issues with bleeding, bruising, or dark stools.  He also notes that his appetite is good and he is not having any cough or shortness of breath.  He denies any fevers, chills, sweats, nausea, vomiting or diarrhea.  He is not having any other new or concerning symptoms.  A full 10 point ROS is listed below.  MEDICAL HISTORY:  Past Medical History:  Diagnosis Date   Gout    Hypertension    Kidney failure     SURGICAL HISTORY: No past surgical history on file.  SOCIAL HISTORY: Social History   Socioeconomic History    Marital status: Single    Spouse name: Not on file   Number of children: Not on file   Years of education: Not on file   Highest education level: Not on file  Occupational History   Not on file  Tobacco Use   Smoking status: Every Day    Packs/day: 0.24    Years: 15.00    Total pack years: 3.60    Types: Cigarettes   Smokeless tobacco: Never  Vaping Use   Vaping Use: Never used  Substance and Sexual Activity   Alcohol use: Yes    Alcohol/week: 3.0 standard drinks of alcohol    Types: 3 Standard drinks or equivalent per week   Drug use: No   Sexual activity: Not on file  Other Topics Concern   Not on file  Social History Narrative   Not on file   Social Determinants of Health   Financial Resource Strain: Not on file  Food Insecurity: Not on file  Transportation Needs: Not on file  Physical Activity: Not on file  Stress: Not on file  Social Connections: Not on file  Intimate Partner Violence: Not on file    FAMILY HISTORY: Family History  Problem Relation Age of Onset   Diabetes Mother     ALLERGIES:  has No Known Allergies.  MEDICATIONS:  Current Outpatient Medications  Medication Sig Dispense Refill   amLODipine (NORVASC) 5 MG tablet Take 1 tablet (5 mg total) by mouth daily.  apixaban (ELIQUIS) 5 MG TABS tablet Take 1 tablet (5 mg total) by mouth 2 (two) times daily. 60 tablet 6   colchicine 0.6 MG tablet Take 0.5 tablets (0.3 mg total) by mouth daily. Take for 7 days, STOP if experiencing GI upset, diarrhea or the acute gout flare has resolved. 30 tablet 3   [employer] VITAMIN B12 1000 MCG tablet TAKE 1 TABLET BY MOUTH EVERY DAY (Patient taking differently: Take 500 mcg by mouth daily.) 30 tablet 3   febuxostat (ULORIC) 40 MG tablet Take 1 tablet (40 mg total) by mouth daily. 30 tablet 1   predniSONE (DELTASONE) 10 MG tablet Prednisone dosing: Take  Prednisone 40mg  (4 tabs) x 3 days, then taper to 30mg  (3 tabs) x 3 days, then 20mg  (2 tabs) x 3days, then 10mg  (1  tab) x 3days, then OFF . 30 tablet 0   sodium bicarbonate 325 MG tablet Take 1 tablet (325 mg total) by mouth 2 (two) times daily. 60 tablet 2   sodium zirconium cyclosilicate (LOKELMA) 5 g packet Take 5 g by mouth daily. 1 packet 0   traMADol (ULTRAM) 50 MG tablet Take 1 tablet (50 mg total) by mouth every 8 (eight) hours as needed for moderate pain or severe pain. 20 tablet 0   No current facility-administered medications for this visit.    REVIEW OF SYSTEMS:   Constitutional: ( - ) fevers, ( - )  chills , ( - ) night sweats Eyes: ( - ) blurriness of vision, ( - ) double vision, ( - ) watery eyes Ears, nose, mouth, throat, and face: ( - ) mucositis, ( - ) sore throat Respiratory: ( - ) cough, ( - ) dyspnea, ( - ) wheezes Cardiovascular: ( - ) palpitation, ( - ) chest discomfort, ( - ) lower extremity swelling Gastrointestinal:  ( - ) nausea, ( - ) heartburn, ( - ) change in bowel habits Skin: ( - ) abnormal skin rashes Lymphatics: ( - ) new lymphadenopathy, ( - ) easy bruising Neurological: ( - ) numbness, ( - ) tingling, ( - ) new weaknesses Behavioral/Psych: ( - ) mood change, ( - ) new changes  All other systems were reviewed with the patient and are negative.  PHYSICAL EXAMINATION:  Vitals:   11/30/21 1049  BP: (!) 144/95  Pulse: 84  Resp: 17  Temp: 97.7 F (36.5 C)  SpO2: 100%    Filed Weights   11/30/21 1049  Weight: 161 lb 11.2 oz (73.3 kg)     GENERAL: Well-appearing middle-aged African-American male, alert, no distress and comfortable SKIN: skin color, texture, turgor are normal, no rashes or significant lesions EYES: conjunctiva are pink and non-injected, sclera clear LUNGS: clear to auscultation and percussion with normal breathing effort HEART: regular rate & rhythm and no murmurs and no lower extremity edema Musculoskeletal: Marked tophaceous gout of digits bilaterally, improved from prior.  No cyanosis of digits and no clubbing  PSYCH: alert & oriented x  3, fluent speech NEURO: no focal motor/sensory deficits  LABORATORY DATA:  I have reviewed the data as listed    Latest Ref Rng & Units 11/30/2021   10:38 AM 11/20/2021    6:35 AM 11/19/2021    1:52 AM  CBC  WBC 4.0 - 10.5 K/uL 15.6  29.5  19.8   Hemoglobin 13.0 - 17.0 g/dL 9.6  9.3  7.5   Hematocrit 39.0 - 52.0 % 28.3  27.9  22.7   Platelets 150 - 400 K/uL 378  666  628        Latest Ref Rng & Units 11/30/2021   10:38 AM 11/20/2021    6:35 AM 11/19/2021    1:52 AM  CMP  Glucose 70 - 99 mg/dL 226  218  189   BUN 8 - 23 mg/dL 29  56  44   Creatinine 0.61 - 1.24 mg/dL 2.04  2.38  2.43   Sodium 135 - 145 mmol/L 132  133  134   Potassium 3.5 - 5.1 mmol/L 4.6  5.3  5.1   Chloride 98 - 111 mmol/L 105  109  111   CO2 22 - 32 mmol/L 20  16  16    Calcium 8.9 - 10.3 mg/dL 8.5  9.0  8.5   Total Protein 6.5 - 8.1 g/dL 6.3     Total Bilirubin 0.3 - 1.2 mg/dL 0.4     Alkaline Phos 38 - 126 U/L 43     AST 15 - 41 U/L 9     ALT 0 - 44 U/L 8       RADIOGRAPHIC STUDIES: CT KNEE LEFT WO CONTRAST  Result Date: 11/18/2021 CLINICAL DATA:  Knee pain. Aggressive feature on x-ray. Popliteal fossa mass noted on venous Doppler. EXAM: CT OF THE LEFT KNEE WITHOUT CONTRAST TECHNIQUE: Multidetector CT imaging of the left knee was performed according to the standard protocol. Multiplanar CT image reconstructions were also generated. RADIATION DOSE REDUCTION: This exam was performed according to the departmental dose-optimization program which includes automated exposure control, adjustment of the mA and/or kV according to patient size and/or use of iterative reconstruction technique. COMPARISON:  None Available. FINDINGS: Bones/Joint/Cartilage There is diffuse osteopenia. No evidence of acute fracture or dislocation. There is moderate size joint effusion with multiple calcified structures, likely representing loose bodies. There are multiple small foci of gas in suprapatellar joint effusion concerning for  infectious/inflammatory process. Geographic peripherally sclerotic lesion in the proximal tibia likely bone infarct. Ligaments Suboptimally assessed by CT. Muscles and Tendons There is dystrophic calcification about the insertion of the patellar tendon. There is also ossification anterior to the patella, likely sequela of chronic bursitis, which may be secondary to gouty arthritis. Soft tissues Subcutaneous soft tissue edema about posterior and medial aspect of the leg. IMPRESSION: 1.  No evidence of fracture or dislocation. 2. Large joint effusion with multiple foci of gas concerning for infectious/inflammatory process. Joint aspiration for further management would be helpful. Clinical correlation is suggested. 3. Multiple calcified bodies in the joint space, which may be loose bodies or sequela of chronic arthritis. 4. Prepatellar calcification as well as advanced enthesopathy of the patellar tendon, which may be secondary to inflammatory arthropathy including gouty arthritis. Electronically Signed   By: Keane Police D.O.   On: 11/18/2021 21:13   VAS [place] LOWER EXTREMITY VENOUS (DVT) (ONLY [facility] & [facility])  Result Date: 11/18/2021  Lower Venous DVT Study Patient Name:  EMORI KAMAU  Date of Exam:   11/18/2021 Medical Rec #: [id number]       Accession #:    [id number] Date of Birth: [birth year]      Patient Gender: M Patient Age:   20 years Exam Location:  [facility] Procedure:      VAS [place] LOWER EXTREMITY VENOUS (DVT) Referring Phys: Wandra Feinstein RATHORE --------------------------------------------------------------------------------  Indications: Left greater than right worsening edema.  Comparison Study: 11/02/2021- negative left lower extremity venous duplex Performing Technologist: Maudry Mayhew MHA, RDMS, RVT, RDCS  Examination Guidelines: A complete evaluation includes B-mode  imaging, spectral Doppler, color Doppler, and power Doppler as needed of all accessible portions of each vessel. Bilateral testing  is considered an integral part of a complete examination. Limited examinations for reoccurring indications may be performed as noted. The reflux portion of the exam is performed with the patient in reverse Trendelenburg.  +-----+---------------+---------+-----------+----------+--------------+ RIGHTCompressibilityPhasicitySpontaneityPropertiesThrombus Aging +-----+---------------+---------+-----------+----------+--------------+ CFV  Full           Yes      Yes                                 +-----+---------------+---------+-----------+----------+--------------+   +---------+---------------+---------+-----------+----------+--------------+ LEFT     CompressibilityPhasicitySpontaneityPropertiesThrombus Aging +---------+---------------+---------+-----------+----------+--------------+ CFV      Full           Yes      Yes                                 +---------+---------------+---------+-----------+----------+--------------+ SFJ      Full                                                        +---------+---------------+---------+-----------+----------+--------------+ FV Prox  Full                                                        +---------+---------------+---------+-----------+----------+--------------+ FV Mid   Full                                                        +---------+---------------+---------+-----------+----------+--------------+ FV DistalFull                                                        +---------+---------------+---------+-----------+----------+--------------+ PFV      Full                                                        +---------+---------------+---------+-----------+----------+--------------+ POP      Full           Yes      Yes                                 +---------+---------------+---------+-----------+----------+--------------+ PTV      Full                                                         +---------+---------------+---------+-----------+----------+--------------+  PERO     Full                                                        +---------+---------------+---------+-----------+----------+--------------+     Summary: RIGHT: - No evidence of common femoral vein obstruction.  LEFT: - There is no evidence of deep vein thrombosis in the lower extremity.  - No cystic structure found in the popliteal fossa. - Heterogenous area of the left poplitea fossa with internal low-resistant vascularity, and poorly defined margins measuring 2.0 x 2.4 x 2.3 cm. Etiology is unknown; recommend further imaging as clinically indicated.  *See table(s) above for measurements and observations. Electronically signed by Harold Barban MD on 11/18/2021 at 7:55:20 PM.    Final    DG Foot Complete Left  Result Date: 11/17/2021 CLINICAL DATA:  Pain and swelling. EXAM: LEFT FOOT - COMPLETE 3+ VIEW COMPARISON:  Left foot x-ray 03/20/2021 FINDINGS: There is severe soft tissue swelling over the dorsum of the foot and surrounding the ankle. No radiopaque foreign body. No acute fracture or dislocation. No cortical erosions identified. There is stable cystic degenerative change seen at the first interphalangeal joint. IMPRESSION: 1. Severe soft tissue swelling of the foot and ankle. 2. No acute bony abnormality. 3. Stable cystic degenerative change at the first interphalangeal joint. Electronically Signed   By: Ronney Asters M.D.   On: 11/17/2021 23:35   DG Ankle Complete Left  Result Date: 11/17/2021 CLINICAL DATA:  Pain and swelling. EXAM: LEFT ANKLE COMPLETE - 3+ VIEW COMPARISON:  None Available. FINDINGS: There is severe soft tissue swelling of the ankle. Rounded density projects from the skin surface measuring 10 x 24 mm along the lateral ankle. There is no radiopaque foreign body. There is no evidence for cortical erosion or focal osseous lesion. No acute fracture or dislocation. Joint spaces are well maintained.  IMPRESSION: 1. Severe soft tissue swelling of the ankle. 2. Lateral ankle skin lesion, indeterminate. 3. No acute bony abnormality. Electronically Signed   By: Ronney Asters M.D.   On: 11/17/2021 23:33   DG Knee Complete 4 Views Left  Result Date: 11/17/2021 CLINICAL DATA:  Left leg pain and swelling, history of gout EXAM: LEFT KNEE - COMPLETE 4+ VIEW COMPARISON:  03/20/2021 FINDINGS: Frontal, bilateral oblique, and lateral views of the left knee are obtained. There is diffuse soft tissue swelling. Large joint effusion is noted. 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 No acute fracture, subluxation, or dislocation. No destructive bony lesions. Mild medial compartmental joint space narrowing. IMPRESSION: 1. Diffuse soft tissue swelling. 2. Large joint effusion. 3. No acute or destructive bony lesions. 4. Osteopenia. Electronically Signed   By: Randa Ngo M.D.   On: 11/17/2021 20:08   VAS [place] LOWER EXTREMITY VENOUS (DVT) (7a-7p)  Result Date: 11/02/2021  Lower Venous DVT Study Patient Name:  MALIKE FOGLIO  Date of Exam:   11/02/2021 Medical Rec #: [id number]       Accession #:    [id number] Date of Birth: [birth year]      Patient Gender: M Patient Age:   62 years Exam Location:  [facility] Procedure:      VAS [place] LOWER EXTREMITY VENOUS (DVT) Referring Phys: [place] --------------------------------------------------------------------------------  Indications: Pain.  Risk Factors: None identified. Limitations: Poor ultrasound/tissue interface  and patient positioning, patient immobility, patient pain tolerance. Comparison Study: No prior studies. Performing Technologist: Oliver Hum RVT  Examination Guidelines: A complete evaluation includes B-mode imaging, spectral Doppler, color Doppler, and power Doppler as needed of all accessible portions of each vessel. Bilateral testing is considered an integral part of a complete examination. Limited examinations  for reoccurring indications may be performed as noted. The reflux portion of the exam is performed with the patient in reverse Trendelenburg.  +-----+---------------+---------+-----------+----------+--------------+ RIGHTCompressibilityPhasicitySpontaneityPropertiesThrombus Aging +-----+---------------+---------+-----------+----------+--------------+ CFV  Full           Yes      Yes                                 +-----+---------------+---------+-----------+----------+--------------+   +---------+---------------+---------+-----------+----------+--------------+ LEFT     CompressibilityPhasicitySpontaneityPropertiesThrombus Aging +---------+---------------+---------+-----------+----------+--------------+ CFV      Full           Yes      Yes                                 +---------+---------------+---------+-----------+----------+--------------+ SFJ      Full                                                        +---------+---------------+---------+-----------+----------+--------------+ FV Prox  Full                                                        +---------+---------------+---------+-----------+----------+--------------+ FV Mid   Full                                                        +---------+---------------+---------+-----------+----------+--------------+ FV DistalFull                                                        +---------+---------------+---------+-----------+----------+--------------+ PFV      Full                                                        +---------+---------------+---------+-----------+----------+--------------+ POP      Full           Yes      Yes                                 +---------+---------------+---------+-----------+----------+--------------+ PTV      Full                                                        +---------+---------------+---------+-----------+----------+--------------+  PERO     Full                                                        +---------+---------------+---------+-----------+----------+--------------+     Summary: RIGHT: - No evidence of common femoral vein obstruction.  LEFT: - There is no evidence of deep vein thrombosis in the lower extremity. However, portions of this examination were limited- see technologist comments above.  - No cystic structure found in the popliteal fossa.  *See table(s) above for measurements and observations. Electronically signed by Deitra Mayo MD on 11/02/2021 at 4:20:57 PM.    Final     ASSESSMENT & PLAN Kaelum Kissick 67 y.o. male with medical history significant for leukocytosis/thrombocytosis and normocytic anemia who presents for a follow up visit.  After review of the labs, review of the records, and discussion with the patient the patients findings are most consistent with leukocytosis, thrombocytosis, and anemia secondary to inflammation.  The patient has very poorly controlled gout of the hands with marked swelling and deformity of his fingers.  Fortunately he does have upcoming appointment with rheumatology in order to address this further.  We will rule out other possible causes by checking for nutritional deficiency and assessing his erythropoietin levels.  No indication for an MPN work-up at this time as there is a clear underlying inflammatory cause for his current findings.   # Leukocytosis/Thrombocytosis, fluctuating.  # Normocytic Anemia, improving -- blood counts appear to be normalizing with treatment of latest gout flare (hospitalized from 5/30-11/20/2021).   --strongly suspect findings were due to gout/inflammation. Trending in appropriate direction with treatment.  --due to persistently elevated blood counts will conduct an MPN workup today to effective r/o those conditions. --will also order full anemia labs to include nutritional panel and hemolysis labs.  --Labs today show white blood cell  count 15.6, hemoglobin 9.6, MCV 86.3, and platelets of 378.  Leukocytosis is of neutrophilic predominance.  All labs improving from labs on discharge from 11/20/2021. --Return as needed. Please re-refer for any new or worsening hematological abnormalities.   No orders of the defined types were placed in this encounter.  All questions were answered. The patient knows to call the clinic with any problems, questions or concerns.  A total of more than 30 minutes were spent on this encounter with face-to-face time and non-face-to-face time, including preparing to see the patient, ordering tests and/or medications, counseling the patient and coordination of care as outlined above.   Ledell Peoples, MD Department of Hematology/Oncology [place] [facility] Phone: [phone] Pager: [phone] Email: [email or web address]  11/30/2021 1:31 PM

## 2021-11-30 NOTE — Telephone Encounter (Signed)
Added appointment to existing appointment per inbasket message. Patient aware.

## 2021-12-01 LAB — HAPTOGLOBIN: Haptoglobin: 314 mg/dL (ref 32–363)

## 2021-12-01 NOTE — Telephone Encounter (Signed)
Attempted to contact patient again # [phone] to inquire about his wound care supplies, voicemail full, unable to leave a message.

## 2021-12-08 LAB — BCR ABL1 FISH (GENPATH)

## 2021-12-08 LAB — JAK2 (INCLUDING V617F AND EXON 12), MPL,& CALR W/RFL MPN PANEL (NGS)

## 2021-12-11 ENCOUNTER — Telehealth: Payer: Self-pay | Admitting: Internal Medicine

## 2021-12-11 NOTE — Telephone Encounter (Signed)
Copied from [place] [phone]. Topic: General - Other >> Dec 11, 2021  3:47 PM Ja-Kwan M wrote: Reason for CRM: Pt stated he has a virtual meeting with disability office coming up and requests a copy of his records regarding his appt with Dr.Johnson. Pt requests call back at [phone]

## 2021-12-14 NOTE — Telephone Encounter (Signed)
Patient called back,  checking status of his request for info about his very first appt , he says 6 months ago from last year, but didn't have the exact date and he says he needs this by Friday. Please call back.

## 2021-12-15 ENCOUNTER — Telehealth: Payer: Self-pay

## 2021-12-15 NOTE — Telephone Encounter (Signed)
-----   Message from Otila Kluver, RN sent at 12/14/2021  4:47 PM EDT -----  ----- Message ----- From: Orson Slick, MD Sent: 12/13/2021   2:08 PM EDT To: Otila Kluver, RN  Please let Mr. Capano know that his iron levels are quite low.  I am concerned that with his inflammation from gout he may not be absorbing iron appropriately through his GI tract.  If he is willing we can schedule him for IV iron therapy.  If this is something he is agreeable to we will get this scheduled as soon as is feasible.  If not, would recommend that he take p.o. iron therapy though I am concerned that would be ineffective in raising his iron levels.  Please schedule follow-up visit with labs for him in 3 months time. ----- Message ----- From: Buel Ream, Lab In [place] Sent: 11/30/2021  10:45 AM EDT To: Orson Slick, MD

## 2021-12-15 NOTE — Telephone Encounter (Signed)
Attempted to contact pt with lab results and Dr recommendations.   No answer, mail box full and pt does not have My Chart.

## 2021-12-16 ENCOUNTER — Telehealth: Payer: Self-pay

## 2021-12-16 NOTE — Telephone Encounter (Signed)
LM with Gerald Stabs, pt's brother, to have pt return my call to review lab results.

## 2021-12-16 NOTE — Telephone Encounter (Signed)
-----   Message from Otila Kluver, RN sent at 12/14/2021  4:47 PM EDT -----  ----- Message ----- From: Orson Slick, MD Sent: 12/13/2021   2:08 PM EDT To: Otila Kluver, RN  Please let [name] know that his iron levels are quite low.  I am concerned that with his inflammation from gout he may not be absorbing iron appropriately through his GI tract.  If he is willing we can schedule him for IV iron therapy.  If this is something he is agreeable to we will get this scheduled as soon as is feasible.  If not, would recommend that he take p.o. iron therapy though I am concerned that would be ineffective in raising his iron levels.  Please schedule follow-up visit with labs for him in 3 months time. ----- Message ----- From: Buel Ream, Lab In [place] Sent: 11/30/2021  10:45 AM EDT To: Orson Slick, MD

## 2021-12-16 NOTE — Telephone Encounter (Signed)
Spoke with Brad [name] and advised of lab results and Drs recommendations.  He is in agreement with having IV iron treatments.  He has been advised that we will submit this to his insurance for approval and then someone from scheduling will call him with the appt.

## 2021-12-16 NOTE — Telephone Encounter (Signed)
Opened in error

## 2021-12-17 ENCOUNTER — Other Ambulatory Visit: Payer: Self-pay | Admitting: Hematology and Oncology

## 2021-12-17 DIAGNOSIS — D500 Iron deficiency anemia secondary to blood loss (chronic): Secondary | ICD-10-CM

## 2021-12-18 ENCOUNTER — Ambulatory Visit: Payer: No Typology Code available for payment source | Admitting: Internal Medicine

## 2021-12-18 NOTE — Telephone Encounter (Signed)
Pt was called and he states that he no longer needs the paperwork.

## 2021-12-28 ENCOUNTER — Other Ambulatory Visit: Payer: Self-pay | Admitting: Internal Medicine

## 2021-12-28 DIAGNOSIS — N184 Chronic kidney disease, stage 4 (severe): Secondary | ICD-10-CM

## 2021-12-28 DIAGNOSIS — I129 Hypertensive chronic kidney disease with stage 1 through stage 4 chronic kidney disease, or unspecified chronic kidney disease: Secondary | ICD-10-CM

## 2021-12-28 NOTE — Telephone Encounter (Signed)
Medication Refill - Medication: amLODipine (NORVASC) 5 MG tablet [[id number]]   Has the patient contacted their pharmacy? Yes.   (Agent: If no, request that the patient contact the pharmacy for the refill. If patient does not wish to contact the pharmacy document the reason why and proceed with request.) (Agent: If yes, when and what did the pharmacy advise?)  Preferred Pharmacy (with phone number or street name):  [id number] - [place], [place] [address]  Phone: [phone] Fax: [phone]  Hours: Not open 24 hours   Has the patient been seen for an appointment in the last year OR does the patient have an upcoming appointment? Yes.  01/13/22  Agent: Please be advised that RX refills may take up to 3 business days. We ask that you follow-up with your pharmacy.

## 2021-12-29 NOTE — Telephone Encounter (Signed)
Requested medication (s) are due for refill today: unclear  Requested medication (s) are on the active medication list: yes  Last refill:  11/20/21, no details, provider not in this practice  Future visit scheduled: yes, 01/13/22  Notes to clinic:  Prescriber not in this practice, please assess.      Requested Prescriptions  Pending Prescriptions Disp Refills   amLODipine (NORVASC) 5 MG tablet      Sig: Take 1 tablet (5 mg total) by mouth daily.     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

## 2021-12-30 MED ORDER — AMLODIPINE BESYLATE 5 MG PO TABS: 5.0000 mg | ORAL_TABLET | Freq: Every day | ORAL | 0 refills | Status: DC

## 2022-01-01 ENCOUNTER — Telehealth: Payer: Self-pay | Admitting: Hematology and Oncology

## 2022-01-01 NOTE — Telephone Encounter (Signed)
.  Called patient to schedule appointment per 6/28 inbasket, patient is aware of date and time.   

## 2022-01-03 ENCOUNTER — Emergency Department (HOSPITAL_BASED_OUTPATIENT_CLINIC_OR_DEPARTMENT_OTHER): Payer: No Typology Code available for payment source

## 2022-01-03 ENCOUNTER — Encounter (HOSPITAL_BASED_OUTPATIENT_CLINIC_OR_DEPARTMENT_OTHER): Payer: Self-pay | Admitting: Emergency Medicine

## 2022-01-03 ENCOUNTER — Other Ambulatory Visit: Payer: Self-pay

## 2022-01-03 ENCOUNTER — Encounter: Payer: Self-pay | Admitting: Hematology and Oncology

## 2022-01-03 ENCOUNTER — Emergency Department (HOSPITAL_BASED_OUTPATIENT_CLINIC_OR_DEPARTMENT_OTHER)
Admission: EM | Admit: 2022-01-03 | Discharge: 2022-01-03 | Disposition: A | Discharge status: Home / Self Care | Payer: No Typology Code available for payment source | Attending: Emergency Medicine | Admitting: Emergency Medicine

## 2022-01-03 DIAGNOSIS — X58XXXA Exposure to other specified factors, initial encounter: Secondary | ICD-10-CM | POA: Insufficient documentation

## 2022-01-03 DIAGNOSIS — S91302A Unspecified open wound, left foot, initial encounter: Secondary | ICD-10-CM | POA: Insufficient documentation

## 2022-01-03 DIAGNOSIS — L98491 Non-pressure chronic ulcer of skin of other sites limited to breakdown of skin: Secondary | ICD-10-CM

## 2022-01-03 DIAGNOSIS — E11622 Type 2 diabetes mellitus with other skin ulcer: Secondary | ICD-10-CM | POA: Insufficient documentation

## 2022-01-03 DIAGNOSIS — Z7901 Long term (current) use of anticoagulants: Secondary | ICD-10-CM | POA: Diagnosis not present

## 2022-01-03 DIAGNOSIS — L97221 Non-pressure chronic ulcer of left calf limited to breakdown of skin: Secondary | ICD-10-CM | POA: Diagnosis not present

## 2022-01-03 LAB — CBC WITH DIFFERENTIAL/PLATELET
Abs Immature Granulocytes: 0.02 10*3/uL (ref 0.00–0.07)
Basophils Absolute: 0.1 10*3/uL (ref 0.0–0.1)
Basophils Relative: 1 %
Eosinophils Absolute: 0.2 10*3/uL (ref 0.0–0.5)
Eosinophils Relative: 2 %
HCT: 29 % — ABNORMAL LOW (ref 39.0–52.0)
Hemoglobin: 10.1 g/dL — ABNORMAL LOW (ref 13.0–17.0)
Immature Granulocytes: 0 %
Lymphocytes Relative: 39 %
Lymphs Abs: 3.6 10*3/uL (ref 0.7–4.0)
MCH: 32 pg (ref 26.0–34.0)
MCHC: 34.8 g/dL (ref 30.0–36.0)
MCV: 91.8 fL (ref 80.0–100.0)
Monocytes Absolute: 0.4 10*3/uL (ref 0.1–1.0)
Monocytes Relative: 5 %
Neutro Abs: 5.1 10*3/uL (ref 1.7–7.7)
Neutrophils Relative %: 53 %
Platelets: 522 10*3/uL — ABNORMAL HIGH (ref 150–400)
RBC: 3.16 MIL/uL — ABNORMAL LOW (ref 4.22–5.81)
RDW: 20.5 % — ABNORMAL HIGH (ref 11.5–15.5)
WBC: 9.5 10*3/uL (ref 4.0–10.5)
nRBC: 0 % (ref 0.0–0.2)

## 2022-01-03 LAB — BASIC METABOLIC PANEL
Anion gap: 12 (ref 5–15)
BUN: 30 mg/dL — ABNORMAL HIGH (ref 8–23)
CO2: 19 mmol/L — ABNORMAL LOW (ref 22–32)
Calcium: 9.5 mg/dL (ref 8.9–10.3)
Chloride: 104 mmol/L (ref 98–111)
Creatinine, Ser: 2.18 mg/dL — ABNORMAL HIGH (ref 0.61–1.24)
GFR, Estimated: 33 mL/min — ABNORMAL LOW (ref >60)
Glucose, Bld: 102 mg/dL — ABNORMAL HIGH (ref 70–99)
Potassium: 4.7 mmol/L (ref 3.5–5.1)
Sodium: 135 mmol/L (ref 135–145)

## 2022-01-03 LAB — SEDIMENTATION RATE: Sed Rate: 103 mm/hr — ABNORMAL HIGH (ref 0–16)

## 2022-01-03 MED ORDER — DOXYCYCLINE HYCLATE 100 MG PO CAPS: 100.0000 mg | ORAL_CAPSULE | Freq: Two times a day (BID) | ORAL | 0 refills | Status: AC

## 2022-01-03 NOTE — ED Notes (Signed)
RN Megan aware of blood pressure.

## 2022-01-03 NOTE — ED Provider Notes (Signed)
[facility] Provider Note   CSN: [id number] Arrival date & time: 01/03/22  1751     History  Chief Complaint  Patient presents with   Skin Ulcer    Brad [name] is a 66 y.o. male.  Patient with history of gout, high blood pressure, renal failure presents with left foot wound that is not healing.  Patient feels that started as get wound and is seen for it about 4 weeks ago however it is gotten a little larger and has some mild yellow color to it.  Patient's been cleaning it every day and putting peroxide on it regularly.  No fevers or chills no spreading redness.  Patient feels well otherwise.  No significant pain.  No diabetes history.  No new injuries.  Patient does not have any known vascular disease.  Patient does smoke cigarettes.       Home Medications Prior to Admission medications   Medication Sig Start Date End Date Taking? Authorizing Provider  doxycycline (VIBRAMYCIN) 100 MG capsule Take 1 capsule (100 mg total) by mouth 2 (two) times daily. One po bid x 7 days 01/03/22  Yes Elnora Morrison, MD  amLODipine (NORVASC) 5 MG tablet Take 1 tablet (5 mg total) by mouth daily. 12/30/21   Ladell Pier, MD  apixaban (ELIQUIS) 5 MG TABS tablet Take 1 tablet (5 mg total) by mouth 2 (two) times daily. 04/02/21   Jerline Pain, MD  colchicine 0.6 MG tablet Take 0.5 tablets (0.3 mg total) by mouth daily. Take for 7 days, STOP if experiencing GI upset, diarrhea or the acute gout flare has resolved. 11/21/21   Rai, Ripudeep Raliegh Ip, MD  [facility] VITAMIN B12 1000 MCG tablet TAKE 1 TABLET BY MOUTH EVERY DAY Patient taking differently: Take 500 mcg by mouth daily. 06/28/21   Orson Slick, MD  febuxostat (ULORIC) 40 MG tablet Take 1 tablet (40 mg total) by mouth daily. 10/30/21   Rice, Resa Miner, MD  predniSONE (DELTASONE) 10 MG tablet Prednisone dosing: Take  Prednisone 40mg  (4 tabs) x 3 days, then taper to 30mg  (3 tabs) x 3 days, then 20mg  (2 tabs) x 3days, then  10mg  (1 tab) x 3days, then OFF . 11/21/21   Rai, Ripudeep K, MD  sodium bicarbonate 325 MG tablet Take 1 tablet (325 mg total) by mouth 2 (two) times daily. 11/13/21   Ladell Pier, MD  sodium zirconium cyclosilicate (LOKELMA) 5 g packet Take 5 g by mouth daily. 11/09/21   Ladell Pier, MD  traMADol (ULTRAM) 50 MG tablet Take 1 tablet (50 mg total) by mouth every 8 (eight) hours as needed for moderate pain or severe pain. 11/20/21 11/20/22  Mendel Corning, MD      Allergies    Patient has no known allergies.    Review of Systems   Review of Systems  Constitutional:  Negative for chills and fever.  HENT:  Negative for congestion.   Eyes:  Negative for visual disturbance.  Respiratory:  Negative for shortness of breath.   Cardiovascular:  Negative for chest pain.  Gastrointestinal:  Negative for abdominal pain and vomiting.  Genitourinary:  Negative for dysuria and flank pain.  Musculoskeletal:  Negative for back pain, neck pain and neck stiffness.  Skin:  Positive for wound. Negative for rash.  Neurological:  Negative for light-headedness and headaches.    Physical Exam Updated Vital Signs BP (!) 143/67   Pulse (!) 53   Temp 98.1 F (36.7 C) (  Oral)   Resp 16   Ht 6' (1.829 m)   Wt 72.6 kg   SpO2 100%   BMI 21.70 kg/m  Physical Exam Vitals and nursing note reviewed.  Constitutional:      General: He is not in acute distress.    Appearance: He is well-developed.  HENT:     Head: Normocephalic and atraumatic.     Mouth/Throat:     Mouth: Mucous membranes are moist.  Eyes:     General:        Right eye: No discharge.        Left eye: No discharge.     Conjunctiva/sclera: Conjunctivae normal.  Neck:     Trachea: No tracheal deviation.  Cardiovascular:     Rate and Rhythm: Normal rate.  Pulmonary:     Effort: Pulmonary effort is normal.  Abdominal:     General: There is no distension.     Palpations: Abdomen is soft.     Tenderness: There is no abdominal  tenderness. There is no guarding.  Musculoskeletal:        General: Swelling present.     Cervical back: Normal range of motion and neck supple. No rigidity.  Skin:    General: Skin is warm.     Capillary Refill: Capillary refill takes less than 2 seconds.     Findings: Erythema and rash present.     Comments: Patient has ulcer approximately 8 cm diameter dorsal aspect of left foot with clear wound margins and pink throughout.  No spreading erythema warmth around it.  Patient has minimal edema to both feet and ankles.  Patient has pulses in lower extremity bilateral.  Neurological:     General: No focal deficit present.     Mental Status: He is alert.  Psychiatric:        Mood and Affect: Mood normal.     ED Results / Procedures / Treatments   Labs (all labs ordered are listed, but only abnormal results are displayed) Labs Reviewed  CBC WITH DIFFERENTIAL/PLATELET - Abnormal; Notable for the following components:      Result Value   RBC 3.16 (*)    Hemoglobin 10.1 (*)    HCT 29.0 (*)    RDW 20.5 (*)    Platelets 522 (*)    All other components within normal limits  BASIC METABOLIC PANEL - Abnormal; Notable for the following components:   CO2 19 (*)    Glucose, Bld 102 (*)    BUN 30 (*)    Creatinine, Ser 2.18 (*)    GFR, Estimated 33 (*)    All other components within normal limits  SEDIMENTATION RATE    EKG None  Radiology DG Foot 2 Views Left  Result Date: 01/03/2022 CLINICAL DATA:  Left foot ulcer. EXAM: LEFT FOOT - 2 VIEW COMPARISON:  AP, lateral and oblique views 11/17/2021. FINDINGS: There is artifact from an overlying sock. There is moderate generalized edema. The edema was previously severe and has improved, but there is now a dorsal foot ulcer overlying the metatarsals towards the lateral aspect measuring at least 3.3 cm across and 1.1 cm in depth. There is osteopenia without evidence of underlying fractures or destructive lesion. Chronic degenerative cystic  changes of the great toe IP joint are again shown. IMPRESSION: 1. Dorsal foot ulcer overlying the metatarsals. 2. Osteopenia with no evidence of underlying fractures or destructive lesions. 3. 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 4. Moderate generalized edema, but was  previously severe. Electronically Signed   By: Telford Nab M.D.   On: 01/03/2022 21:51    Procedures Procedures    Medications Ordered in ED Medications - No data to display  ED Course/ Medical Decision Making/ A&P                           Medical Decision Making Amount and/or Complexity of Data Reviewed Labs: ordered. Radiology: ordered.  Risk Prescription drug management.   Patient presents with unhealing wound on left foot and no known diabetes however patient does have renal disease.  Patient is well-appearing otherwise no systemic symptoms to suggest sepsis or significant cellulitis.  With yellow exudate concern for inflammatory response specially from using peroxide or mild infection.  Screen blood work ordered and normal white count, inflammatory marker pending, electrolytes unremarkable creatinine overall similar to previous 2.18 last was 2.04.  Patient well-appearing in the ER.  X-ray reviewed osteopenia no signs of osteomyelitis or fracture.  Inflammatory markers off to be followed up outpatient.  Discussed importance of follow-up with wound care/plastic surgery and will start on doxycycline.  Patient stable for discharge.        Final Clinical Impression(s) / ED Diagnoses Final diagnoses:  Skin ulcer, limited to breakdown of skin ([place])  Open wound of left foot, initial encounter    Rx / DC Orders ED Discharge Orders          Ordered    doxycycline (VIBRAMYCIN) 100 MG capsule  2 times daily        01/03/22 2215              Elnora Morrison, MD 01/03/22 2220

## 2022-01-03 NOTE — Discharge Instructions (Signed)
You can use wet-to-dry dressing or Vaseline gauze covered and sterile gauze.  Gentle soap and water.  Avoid peroxide.  Use Tylenol every 4 as needed for pain.  Take antibiotics and follow-up closely with wound care/plastic surgery.  Your x-ray did not show any obvious signs of bone infection or broken bones.  Blood work was reassuring however your inflammatory marker did not come back yet.

## 2022-01-03 NOTE — ED Notes (Signed)
Pt refused final VS, stated he was ready to go

## 2022-01-03 NOTE — ED Triage Notes (Signed)
Opened sore on left foot x 1 month. Pt has not been evaluated and isn't sure if he is diabetic or not. Pt was in the hospital 1 month ago for gout and stated the ulcer started while in the hospital and has been getting worse.

## 2022-01-04 ENCOUNTER — Telehealth: Payer: Self-pay | Admitting: Pharmacy Technician

## 2022-01-04 NOTE — Telephone Encounter (Signed)
To PCP please ?

## 2022-01-04 NOTE — Telephone Encounter (Signed)
Sorry, this note seemed to come randomly to me about pt I am not associated with.   thanks

## 2022-01-04 NOTE — Telephone Encounter (Signed)
Brad [name], Please return forms to me once [name] have signed them.  Monoferric patient assistance forms have been faxed to office. Fax: [phone]  Will f/u once I have a response.

## 2022-01-07 NOTE — Telephone Encounter (Signed)
MONOFERRIC PAP: FREE DRUG Patient has been approved for Monoferric free drug. Id: [id number] Date: 01/06/22 - 01/07/23 Phone: [phone]  Patient will be scheduled as soon as possible

## 2022-01-13 ENCOUNTER — Encounter: Payer: Self-pay | Admitting: Physician Assistant

## 2022-01-13 ENCOUNTER — Ambulatory Visit: Payer: No Typology Code available for payment source | Attending: Internal Medicine | Admitting: Physician Assistant

## 2022-01-13 VITALS — BP 116/80 | HR 91 | Ht 72.0 in | Wt 144.4 lb

## 2022-01-13 DIAGNOSIS — L97522 Non-pressure chronic ulcer of other part of left foot with fat layer exposed: Secondary | ICD-10-CM | POA: Diagnosis not present

## 2022-01-13 DIAGNOSIS — M1A9XX1 Chronic gout, unspecified, with tophus (tophi): Secondary | ICD-10-CM | POA: Diagnosis not present

## 2022-01-13 DIAGNOSIS — Z09 Encounter for follow-up examination after completed treatment for conditions other than malignant neoplasm: Secondary | ICD-10-CM | POA: Diagnosis not present

## 2022-01-13 DIAGNOSIS — N184 Chronic kidney disease, stage 4 (severe): Secondary | ICD-10-CM

## 2022-01-13 DIAGNOSIS — I129 Hypertensive chronic kidney disease with stage 1 through stage 4 chronic kidney disease, or unspecified chronic kidney disease: Secondary | ICD-10-CM | POA: Diagnosis not present

## 2022-01-13 MED ORDER — AMLODIPINE BESYLATE 5 MG PO TABS: 5.0000 mg | ORAL_TABLET | Freq: Every day | ORAL | 0 refills | Status: AC

## 2022-01-13 MED ORDER — COLCHICINE 0.6 MG PO TABS: 0.3000 mg | ORAL_TABLET | Freq: Every day | ORAL | 3 refills | Status: DC

## 2022-01-13 MED ORDER — MUPIROCIN 2 % EX OINT: 1.0000 | TOPICAL_OINTMENT | Freq: Two times a day (BID) | CUTANEOUS | 0 refills | Status: AC

## 2022-01-13 NOTE — Progress Notes (Signed)
Patient ID: Brad [name], male   DOB: Mar 06, 1956, 66 y.o.   MRN: [id number]   Brad [name], is a 66 y.o. male  RSW:[id number]  KXF:[id number]  DOB - 08-Apr-1956  No chief complaint on file.      Subjective:   Brad [name] is a 66 y.o. male here today for a follow up visit After ED visit  01/03/2022 and to get med RF.  He was seen for a large ulceration on the dorsum of his R foot.  He says it is not painful and "looks better" since taking the doxy.  Finished doxy several days ago.  No fever.  He was told to f/up with plastics or wound care but not referred.    From ED note Brad [name] is a 66 y.o. male.   Patient with history of gout, high blood pressure, renal failure presents with left foot wound that is not healing.  Patient feels that started as get wound and is seen for it about 4 weeks ago however it is gotten a little larger and has some mild yellow color to it.  Patient's been cleaning it every day and putting peroxide on it regularly.  No fevers or chills no spreading redness.  Patient feels well otherwise.  No significant pain.  No diabetes history.  No new injuries.  Patient does not have any known vascular disease.  Patient does smoke cigarettes.  From A/P:   Patient presents with unhealing wound on left foot and no known diabetes however patient does have renal disease.  Patient is well-appearing otherwise no systemic symptoms to suggest sepsis or significant cellulitis.  With yellow exudate concern for inflammatory response specially from using peroxide or mild infection.  Screen blood work ordered and normal white count, inflammatory marker pending, electrolytes unremarkable creatinine overall similar to previous 2.18 last was 2.04.  Patient well-appearing in the ER.  X-ray reviewed osteopenia no signs of osteomyelitis or fracture.  Inflammatory markers off to be followed up outpatient.  Discussed importance of follow-up with wound care/plastic surgery and will start on  doxycycline.  Patient stable for discharge.    Patient has No headache, No chest pain, No abdominal pain - No Nausea, No new weakness tingling or numbness, No Cough - SOB.   No problems updated.  ALLERGIES: No Known Allergies  PAST MEDICAL HISTORY: Past Medical History:  Diagnosis Date   Gout    Hypertension    Kidney failure     MEDICATIONS AT HOME: Prior to Admission medications   Medication Sig Start Date End Date Taking? Authorizing Provider  apixaban (ELIQUIS) 5 MG TABS tablet Take 1 tablet (5 mg total) by mouth 2 (two) times daily. 04/02/21  Yes Jerline Pain, MD  [facility] VITAMIN B12 1000 MCG tablet TAKE 1 TABLET BY MOUTH EVERY DAY Patient taking differently: Take 500 mcg by mouth daily. 06/28/21  Yes Orson Slick, MD  mupirocin ointment (BACTROBAN) 2 % Apply 1 Application topically 2 (two) times daily. 01/13/22  Yes Argentina Donovan, PA-C  sodium bicarbonate 325 MG tablet Take 1 tablet (325 mg total) by mouth 2 (two) times daily. 11/13/21  Yes Ladell Pier, MD  amLODipine (NORVASC) 5 MG tablet Take 1 tablet (5 mg total) by mouth daily. 01/13/22   Argentina Donovan, PA-C  colchicine 0.6 MG tablet Take 0.5 tablets (0.3 mg total) by mouth daily. Take for 7 days, STOP if experiencing GI upset, diarrhea or the acute gout flare has resolved. 01/13/22   Leslea Vowles,  Dionne Bucy, PA-C  doxycycline (VIBRAMYCIN) 100 MG capsule Take 1 capsule (100 mg total) by mouth 2 (two) times daily. One po bid x 7 days 01/03/22   Elnora Morrison, MD  febuxostat (ULORIC) 40 MG tablet Take 1 tablet (40 mg total) by mouth daily. 10/30/21   Rice, Resa Miner, MD  predniSONE (DELTASONE) 10 MG tablet Prednisone dosing: Take  Prednisone 40mg  (4 tabs) x 3 days, then taper to 30mg  (3 tabs) x 3 days, then 20mg  (2 tabs) x 3days, then 10mg  (1 tab) x 3days, then OFF . 11/21/21   Rai, Ripudeep K, MD  sodium zirconium cyclosilicate (LOKELMA) 5 g packet Take 5 g by mouth daily. 11/09/21   Ladell Pier, MD  traMADol  (ULTRAM) 50 MG tablet Take 1 tablet (50 mg total) by mouth every 8 (eight) hours as needed for moderate pain or severe pain. 11/20/21 11/20/22  Rai, Ripudeep K, MD    ROS: Neg HEENT Neg resp Neg cardiac Neg GI Neg GU Neg psych Neg neuro  Objective:   Vitals:   01/13/22 1537  BP: 116/80  Pulse: 91  SpO2: 100%  Weight: 144 lb 6.4 oz (65.5 kg)  Height: 6' (1.829 m)   Exam General appearance : Awake, alert, not in any distress. Speech Clear. Not toxic looking HEENT: Atraumatic and Normocephalic Neck: Supple, no JVD. No cervical lymphadenopathy.  Chest: Good air entry bilaterally, CTAB.  No rales/rhonchi/wheezing CVS: S1 S2 regular, no murmurs.  Extremities: B/L Lower Ext shows mild edema on L, both legs are warm to touch.  L dorsum of foot with large ~8X6cm ulcer with sub q fat visible at some parts dermis and epidermis missing; eschar formin at edges and no infection.  saline telfa then wrapped with ACE wrap Neurology: Awake alert, and oriented X 3, CN II-XII intact, Non focal Skin: No Rash  Data Review No results found for: "HGBA1C"  Assessment & Plan   1. Ulcer of left foot with fat layer exposed ([place]) Stat qound care center-appt was NOT made from UC.  Taught dressings - AMB referral to wound care center - mupirocin ointment (BACTROBAN) 2 %; Apply 1 Application topically 2 (two) times daily.  Dispense: 22 g; Refill: 0  2. 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  Dispense: 90 tablet; Refill: 0  3. Chronic tophaceous gout - colchicine 0.6 MG tablet; Take 0.5 tablets (0.3 mg total) by mouth daily. Take for 7 days, STOP if experiencing GI upset, diarrhea or the acute gout flare has resolved.  Dispense: 30 tablet; Refill: 3  4. Chronic tophaceous gout - colchicine 0.6 MG tablet; Take 0.5 tablets (0.3 mg total) by mouth daily. Take for 7 days, STOP if experiencing GI  upset, diarrhea or the acute gout flare has resolved.  Dispense: 30 tablet; Refill: 3  5. Encounter for examination following treatment at hospital    Return in about 2 months (around 03/16/2022) for chronic conditions.  The patient was given clear instructions to go to ER or return to medical center if symptoms don't improve, worsen or new problems develop. The patient verbalized understanding. The patient was told to call to get lab results if they haven't heard anything in the next week.      Freeman Caldron, PA-C [facility] [place], [place]   01/13/2022, 4:17 PM

## 2022-01-14 ENCOUNTER — Ambulatory Visit (INDEPENDENT_AMBULATORY_CARE_PROVIDER_SITE_OTHER): Payer: No Typology Code available for payment source

## 2022-01-14 VITALS — BP 127/80 | HR 61 | Temp 98.0°F | Resp 16 | Ht 72.0 in | Wt 142.0 lb

## 2022-01-14 DIAGNOSIS — D500 Iron deficiency anemia secondary to blood loss (chronic): Secondary | ICD-10-CM | POA: Diagnosis not present

## 2022-01-14 MED ORDER — SODIUM CHLORIDE 0.9 % IV SOLN
1000.0000 mg | Freq: Once | INTRAVENOUS | Status: AC
  Administered 2022-01-14: 1000 mg via INTRAVENOUS
  Filled 2022-01-14: qty 10

## 2022-01-14 NOTE — Progress Notes (Signed)
Diagnosis: Iron Deficiency Anemia  Provider:  Marshell Garfinkel, MD  Procedure: Infusion  IV Type: Peripheral, IV Location: L Forearm  Monoferric (Ferric Derisomaltose), Dose: 1000 mg  Infusion Start Time: 5868  Infusion Stop Time: 1450  Post Infusion IV Care: Observation period completed and Peripheral IV Discontinued  Discharge: Condition: Good, Destination: Home . AVS provided to patient.   Performed by:  Adelina Mings, LPN

## 2022-02-03 ENCOUNTER — Encounter (HOSPITAL_BASED_OUTPATIENT_CLINIC_OR_DEPARTMENT_OTHER): Payer: No Typology Code available for payment source | Attending: General Surgery | Admitting: General Surgery

## 2022-02-03 DIAGNOSIS — I480 Paroxysmal atrial fibrillation: Secondary | ICD-10-CM | POA: Diagnosis not present

## 2022-02-03 DIAGNOSIS — M1A9XX1 Chronic gout, unspecified, with tophus (tophi): Secondary | ICD-10-CM | POA: Insufficient documentation

## 2022-02-03 DIAGNOSIS — E43 Unspecified severe protein-calorie malnutrition: Secondary | ICD-10-CM | POA: Diagnosis not present

## 2022-02-03 DIAGNOSIS — L97522 Non-pressure chronic ulcer of other part of left foot with fat layer exposed: Secondary | ICD-10-CM | POA: Diagnosis not present

## 2022-02-03 DIAGNOSIS — I129 Hypertensive chronic kidney disease with stage 1 through stage 4 chronic kidney disease, or unspecified chronic kidney disease: Secondary | ICD-10-CM | POA: Diagnosis not present

## 2022-02-03 DIAGNOSIS — N184 Chronic kidney disease, stage 4 (severe): Secondary | ICD-10-CM | POA: Insufficient documentation

## 2022-02-04 NOTE — Progress Notes (Signed)
RONDA, KAZMI ([id number]) Visit Report for 02/03/2022 Abuse Risk Screen Details Patient Name: Date of Service: Brad [name], [place] LD 02/03/2022 1:30 PM Medical Record Number: [id number] Patient Account Number: [id number] Date of Birth/Sex: Treating RN: January 02, 1956 (66 y.o. Janyth Contes Primary Care Lajada Janes: Karle Plumber Other Clinician: Referring Kendre Sires: Treating Adabelle Griffiths/Extender: Betsy Pries in Treatment: 0 Abuse Risk Screen Items Answer ABUSE RISK SCREEN: Has anyone close to you tried to hurt or harm you recentlyo No Do you feel uncomfortable with anyone in your familyo No Has anyone forced you do things that you didnt want to doo No Electronic Signature(s) Signed: 02/03/2022 4:13:50 PM By: Adline Peals Entered By: Adline Peals on 02/03/2022 13:28:27 -------------------------------------------------------------------------------- Activities of Daily Living Details Patient Name: Date of Service: Brad [name], [place] NA LD 02/03/2022 1:30 PM Medical Record Number: [id number] Patient Account Number: [id number] Date of Birth/Sex: Treating RN: Sep 29, 1955 (66 y.o. Janyth Contes Primary Care Shantinique Picazo: Karle Plumber Other Clinician: Referring Gwendolynn Merkey: Treating Tattiana Fakhouri/Extender: Betsy Pries in Treatment: 0 Activities of Daily Living Items Answer Activities of Daily Living (Please select one for each item) Drive Automobile Completely Able T Medications ake Completely Able Use T elephone Completely Able Care for Appearance Completely Able Use T oilet Completely Able Bath / Shower Completely Able Dress Self Completely Able Feed Self Completely Able Walk Completely Able Get In / Out Bed Completely Able Housework Completely Able Prepare Meals Completely [place] Completely Able Shop for Self Completely Able Electronic Signature(s) Signed: 02/03/2022 4:13:50 PM By: Adline Peals Entered By: Adline Peals on 02/03/2022 13:28:43 -------------------------------------------------------------------------------- Education Screening Details Patient Name: Date of Service: Brad [name], [place] LD 02/03/2022 1:30 PM Medical Record Number: [id number] Patient Account Number: [id number] Date of Birth/Sex: Treating RN: 10/29/1955 (66 y.o. Janyth Contes Primary Care Harlan Vinal: Karle Plumber Other Clinician: Referring Eriel Doyon: Treating Matteo Banke/Extender: Betsy Pries in Treatment: 0 Primary Learner Assessed: Patient Learning Preferences/Education Level/Primary Language Learning Preference: Explanation, Demonstration, Video, Printed Material Highest Education Level: High School Preferred Language: English Cognitive Barrier Language Barrier: No Translator Needed: No Memory Deficit: No Emotional Barrier: No Cultural/Religious Beliefs Affecting Medical Care: No Physical Barrier Impaired Vision: Yes Glasses Impaired Hearing: No Decreased Hand dexterity: No Knowledge/Comprehension Knowledge Level: Medium Comprehension Level: Medium Ability to understand written instructions: Medium Ability to understand verbal instructions: Medium Motivation Anxiety Level: Calm Cooperation: Cooperative Education Importance: Acknowledges Need Interest in Health Problems: Asks Questions Perception: Coherent Willingness to Engage in Self-Management Medium Activities: Readiness to Engage in Self-Management Medium Activities: Electronic Signature(s) Signed: 02/03/2022 4:13:50 PM By: Adline Peals Entered By: Adline Peals on 02/03/2022 13:35:06 -------------------------------------------------------------------------------- Fall Risk Assessment Details Patient Name: Date of Service: RO Clent Demark, [place] NA LD 02/03/2022 1:30 PM Medical Record Number: [id number] Patient Account Number: [id number] Date of Birth/Sex: Treating  RN: [birth year] (65 y.o. Janyth Contes Primary Care Esma Kilts: Karle Plumber Other Clinician: Referring Keionte Swicegood: Treating Johncharles Fusselman/Extender: Betsy Pries in Treatment: 0 Fall Risk Assessment Items Have you had 2 or more falls in the last 12 monthso 0 No Have you had any fall that resulted in injury in the last 12 monthso 0 No FALLS RISK SCREEN History of falling - immediate or within 3 months 0 No Secondary diagnosis (Do you have 2 or more medical diagnoseso) 0 No Ambulatory aid None/bed rest/wheelchair/nurse 0 No Crutches/cane/walker 0 No Furniture 0 No Intravenous therapy Access/Saline/Heparin Lock 0 No Gait/Transferring Normal/ bed rest/ wheelchair 0 Yes Weak (short steps with or  without shuffle, stooped but able to lift head while walking, may seek 0 No support from furniture) Impaired (short steps with shuffle, may have difficulty arising from chair, head down, impaired 0 No balance) Mental Status Oriented to own ability 0 Yes Electronic Signature(s) Signed: 02/03/2022 4:13:50 PM By: Adline Peals Entered By: Adline Peals on 02/03/2022 13:35:17 -------------------------------------------------------------------------------- Foot Assessment Details Patient Name: Date of Service: Brad [name], [place] LD 02/03/2022 1:30 PM Medical Record Number: [id number] Patient Account Number: [id number] Date of Birth/Sex: Treating RN: Sep 27, 1955 (66 y.o. Janyth Contes Primary Care Dwan Hemmelgarn: Karle Plumber Other Clinician: Referring Anoushka Divito: Treating Pattie Flaharty/Extender: Betsy Pries in Treatment: 0 Foot Assessment Items Site Locations + = Sensation present, - = Sensation absent, C = Callus, U = Ulcer R = Redness, W = Warmth, M = Maceration, PU = Pre-ulcerative lesion F = Fissure, S = Swelling, D = Dryness Assessment Right: Left: Other Deformity: No No Prior Foot Ulcer: No No Prior Amputation: No  No Charcot Joint: No No Ambulatory Status: Ambulatory Without Help Gait: Steady Electronic Signature(s) Signed: 02/03/2022 4:13:50 PM By: Adline Peals Entered By: Adline Peals on 02/03/2022 13:35:48 -------------------------------------------------------------------------------- Nutrition Risk Screening Details Patient Name: Date of Service: Brad [name], [place] NA LD 02/03/2022 1:30 PM Medical Record Number: [id number] Patient Account Number: [id number] Date of Birth/Sex: Treating RN: [birth year] (66 y.o. Janyth Contes Primary Care Avondre Richens: Karle Plumber Other Clinician: Referring Kaleem Sartwell: Treating Tramain Gershman/Extender: Betsy Pries in Treatment: 0 Height (in): 72 Weight (lbs): 152 Body Mass Index (BMI): 20.6 Nutrition Risk Screening Items Score Screening NUTRITION RISK SCREEN: I have an illness or condition that made me change the kind and/or amount of food I eat 0 No I eat fewer than two meals per day 0 No I eat few fruits and vegetables, or milk products 0 No I have three or more drinks of beer, liquor or wine almost every day 0 No I have tooth or mouth problems that make it hard for me to eat 0 No I don't always have enough money to buy the food I need 0 No I eat alone most of the time 0 No I take three or more different prescribed or over-the-counter drugs a day 1 Yes Without wanting to, I have lost or gained 10 pounds in the last six months 0 No I am not always physically able to shop, cook and/or feed myself 0 No Nutrition Protocols Good Risk Protocol 0 No interventions needed Moderate Risk Protocol High Risk Proctocol Risk Level: Good Risk Score: 1 Electronic Signature(s) Signed: 02/03/2022 4:13:50 PM By: Adline Peals Entered By: Adline Peals on 02/03/2022 13:35:41

## 2022-02-04 NOTE — Progress Notes (Signed)
BUFFORD, HELMS ([id number]) Visit Report for 02/03/2022 Chief Complaint Document Details Patient Name: Date of Service: Dayton Martes, [place] LD 02/03/2022 1:30 PM Medical Record Number: [id number] Patient Account Number: [id number] Date of Birth/Sex: Treating RN: 01-17-1956 (66 y.o. Janyth Contes Primary Care Provider: Karle Plumber Other Clinician: Referring Provider: Treating Provider/Extender: Betsy Pries in Treatment: 0 Information Obtained from: Patient Chief Complaint Patient seen for complaints of Non-Healing Wound on his dorsal left foot. Electronic Signature(s) Signed: 02/03/2022 2:07:11 PM By: Fredirick Maudlin MD FACS Entered By: Fredirick Maudlin on 02/03/2022 14:07:11 -------------------------------------------------------------------------------- Debridement Details Patient Name: Date of Service: Dayton Martes, RO NA LD 02/03/2022 1:30 PM Medical Record Number: [id number] Patient Account Number: [id number] Date of Birth/Sex: Treating RN: 05-08-1956 (66 y.o. Janyth Contes Primary Care Provider: Karle Plumber Other Clinician: Referring Provider: Treating Provider/Extender: Betsy Pries in Treatment: 0 Debridement Performed for Assessment: Wound #1 Left,Dorsal Foot Performed By: Physician Fredirick Maudlin, MD Debridement Type: Debridement Level of Consciousness (Pre-procedure): Awake and Alert Pre-procedure Verification/Time Out Yes - 14:01 Taken: Start Time: 14:01 Pain Control: Lidocaine 5% topical ointment T Area Debrided (L x W): otal 8.1 (cm) x 2.5 (cm) = 20.25 (cm) Tissue and other material debrided: Non-Viable, Eschar, Slough, Slough Level: Non-Viable Tissue Debridement Description: Selective/Open Wound Instrument: Curette Bleeding: Minimum Hemostasis Achieved: Pressure Procedural Pain: 2 Post Procedural Pain: 0 Response to Treatment: Procedure was tolerated well Level of Consciousness  (Post- Awake and Alert procedure): Post Debridement Measurements of Total Wound Length: (cm) 8.1 Width: (cm) 2.5 Depth: (cm) 0.1 Volume: (cm) 1.59 Character of Wound/Ulcer Post Debridement: Improved Post Procedure Diagnosis Same as Pre-procedure Electronic Signature(s) Signed: 02/03/2022 2:43:32 PM By: Fredirick Maudlin MD FACS Signed: 02/03/2022 4:13:50 PM By: Adline Peals Entered By: Adline Peals on 02/03/2022 14:02:37 -------------------------------------------------------------------------------- HPI Details Patient Name: Date of Service: Dayton Martes, RO NA LD 02/03/2022 1:30 PM Medical Record Number: [id number] Patient Account Number: [id number] Date of Birth/Sex: Treating RN: Mar 01, 1956 (66 y.o. Janyth Contes Primary Care Provider: Karle Plumber Other Clinician: Referring Provider: Treating Provider/Extender: Betsy Pries in Treatment: 0 History of Present Illness HPI Description: ADMISSION 02/03/2022 This is a 66 year old man with a past medical history significant for stage IV chronic kidney disease, chronic tophaceous gout, and essential hypertension. He was admitted to the hospital in June with a severe gout flare. His left leg became tremendously swollen. Ultimately, after discharge, the skin on the top of his left foot broke down. He has treated this variously with Epsom salt soaks and peroxide. He was also prescribed mupirocin at 1 point. As his wound is not healing, he was referred to the wound care center for further evaluation and management. He is not diabetic. ABI in clinic today was 1.03. On the dorsolateral surface of his left foot, there is a large wound. There is evidence of perimeter epithelialization. Eschar and slough are present on the wound surface. There is good granulation tissue forming. No concern for infection. Electronic Signature(s) Signed: 02/03/2022 2:10:00 PM By: Fredirick Maudlin MD FACS Entered By:  Fredirick Maudlin on 02/03/2022 14:10:00 -------------------------------------------------------------------------------- Physical Exam Details Patient Name: Date of Service: RO Clent Demark, RO NA LD 02/03/2022 1:30 PM Medical Record Number: [id number] Patient Account Number: [id number] Date of Birth/Sex: Treating RN: Sep 16, 1955 (66 y.o. Janyth Contes Primary Care Provider: Karle Plumber Other Clinician: Referring Provider: Treating Provider/Extender: Betsy Pries in Treatment: 0 Constitutional Hypertensive, asymptomatic. . . . No acute distress. Respiratory Normal work of breathing  on room air.. Musculoskeletal Extensive gouty tophi. Notes 02/03/2022: On the dorsolateral surface of his left foot, there is a large wound. There is evidence of perimeter epithelialization. Eschar and slough are present on the wound surface. There is good granulation tissue forming. No concern for infection. Electronic Signature(s) Signed: 02/03/2022 2:10:53 PM By: Fredirick Maudlin MD FACS Entered By: Fredirick Maudlin on 02/03/2022 14:10:53 -------------------------------------------------------------------------------- Physician Orders Details Patient Name: Date of Service: RO Clent Demark, RO NA LD 02/03/2022 1:30 PM Medical Record Number: [id number] Patient Account Number: [id number] Date of Birth/Sex: Treating RN: 1955/08/16 (66 y.o. Janyth Contes Primary Care Provider: Karle Plumber Other Clinician: Referring Provider: Treating Provider/Extender: Betsy Pries in Treatment: 0 Verbal / Phone Orders: No Diagnosis Coding ICD-10 Coding Code Description [phone] Non-pressure chronic ulcer of other part of left foot with fat layer exposed N18.4 Chronic kidney disease, stage 4 (severe) M1A.9XX1 Chronic gout, unspecified, with tophus (tophi) R60.0 Localized edema E43 Unspecified severe protein-calorie malnutrition I10 Essential (primary)  hypertension Follow-up Appointments ppointment in 1 week. - Dr. Celine Ahr - room 2 - 8/24 at 3:00 PM Return A Wound Treatment Wound #1 - Foot Wound Laterality: Dorsal, Left Cleanser: Soap and Water Every Other Day/30 Days Discharge Instructions: May shower and wash wound with dial antibacterial soap and water prior to dressing change. Cleanser: Wound Cleanser Every Other Day/30 Days Discharge Instructions: Cleanse the wound with wound cleanser prior to applying a clean dressing using gauze sponges, not tissue or cotton balls. Prim Dressing: SILVERCEL Alginate Dressing, 4x8 (in/in) (DME) (Generic) Every Other Day/30 Days ary Secondary Dressing: Woven Gauze Sponge, Non-Sterile 4x4 in (DME) (Generic) Every Other Day/30 Days Discharge Instructions: Apply over primary dressing as directed. Secured With: Elastic Bandage 4 inch (ACE bandage) (DME) (Generic) Every Other Day/30 Days Discharge Instructions: Secure with ACE bandage as directed. Secured With: [employer], 4.5x3.1 (in/yd) (DME) (Generic) Every Other Day/30 Days Discharge Instructions: Secure with Kerlix as directed. Secured With: 84M Medipore Public affairs consultant Surgical T 2x10 (in/yd) (DME) (Generic) Every Other Day/30 Days ape Discharge Instructions: Secure with tape as directed. Electronic Signature(s) Signed: 02/03/2022 2:22:22 PM By: Fredirick Maudlin MD FACS Entered By: Fredirick Maudlin on 02/03/2022 14:22:22 -------------------------------------------------------------------------------- Problem List Details Patient Name: Date of Service: RO Clent Demark, RO NA LD 02/03/2022 1:30 PM Medical Record Number: [id number] Patient Account Number: [id number] Date of Birth/Sex: Treating RN: 05-20-1956 (66 y.o. Janyth Contes Primary Care Provider: Other Clinician: Karle Plumber Referring Provider: Treating Provider/Extender: Betsy Pries in Treatment: 0 Active Problems ICD-10 Encounter Code Description  Active Date MDM Diagnosis [phone] Non-pressure chronic ulcer of other part of left foot with fat layer exposed 02/03/2022 No Yes N18.4 Chronic kidney disease, stage 4 (severe) 02/03/2022 No Yes M1A.9XX1 Chronic gout, unspecified, with tophus (tophi) 02/03/2022 No Yes R60.0 Localized edema 02/03/2022 No Yes E43 Unspecified severe protein-calorie malnutrition 02/03/2022 No Yes I10 Essential (primary) hypertension 02/03/2022 No Yes Inactive Problems Resolved Problems Electronic Signature(s) Signed: 02/03/2022 2:06:40 PM By: Fredirick Maudlin MD FACS Previous Signature: 02/03/2022 1:36:51 PM Version By: Fredirick Maudlin MD FACS Entered By: Fredirick Maudlin on 02/03/2022 14:06:40 -------------------------------------------------------------------------------- Progress Note Details Patient Name: Date of Service: RO Clent Demark, RO NA LD 02/03/2022 1:30 PM Medical Record Number: [id number] Patient Account Number: [id number] Date of Birth/Sex: Treating RN: [birth year] (66 y.o. Janyth Contes Primary Care Provider: Karle Plumber Other Clinician: Referring Provider: Treating Provider/Extender: Betsy Pries in Treatment: 0 Subjective Chief Complaint Information obtained from Patient Patient seen for complaints of Non-Healing  Wound on his dorsal left foot. History of Present Illness (HPI) ADMISSION 02/03/2022 This is a 66 year old man with a past medical history significant for stage IV chronic kidney disease, chronic tophaceous gout, and essential hypertension. He was admitted to the hospital in June with a severe gout flare. His left leg became tremendously swollen. Ultimately, after discharge, the skin on the top of his left foot broke down. He has treated this variously with Epsom salt soaks and peroxide. He was also prescribed mupirocin at 1 point. As his wound is not healing, he was referred to the wound care center for further evaluation and management. He is not  diabetic. ABI in clinic today was 1.03. On the dorsolateral surface of his left foot, there is a large wound. There is evidence of perimeter epithelialization. Eschar and slough are present on the wound surface. There is good granulation tissue forming. No concern for infection. Patient History Information obtained from Patient. Allergies No Known Allergies Family History Diabetes - Mother, Heart Disease - Mother, Hypertension - Mother, Kidney Disease - Siblings, No family history of Cancer, Hereditary Spherocytosis, Lung Disease, Seizures, Stroke, Thyroid Problems, Tuberculosis. Social History Current some day smoker - "5-6 in 3 days", Marital Status - Divorced, Alcohol Use - Rarely, Drug Use - No History, Caffeine Use - Daily. Medical History Hematologic/Lymphatic Patient has history of Anemia Cardiovascular Patient has history of Arrhythmia - paroxysmal atrial fibrillation, Hypertension Musculoskeletal Patient has history of Gout Medical A Surgical History Notes nd Hematologic/Lymphatic vitamin B12 deficiency, iron deficiency Genitourinary CKD stage IV Review of Systems (ROS) Constitutional Symptoms (General Health) Denies complaints or symptoms of Fatigue, Fever, Chills, Marked Weight Change. Eyes Complains or has symptoms of Glasses / Contacts - glasses. Ear/Nose/Mouth/Throat Denies complaints or symptoms of Chronic sinus problems or rhinitis. Respiratory Denies complaints or symptoms of Chronic or frequent coughs, Shortness of Breath. Gastrointestinal Denies complaints or symptoms of Frequent diarrhea, Nausea, Vomiting. Endocrine Denies complaints or symptoms of Heat/cold intolerance. Integumentary (Skin) Complains or has symptoms of Wounds. Musculoskeletal Denies complaints or symptoms of Muscle Pain, Muscle Weakness. Neurologic Denies complaints or symptoms of Numbness/parasthesias. Psychiatric Complains or has symptoms of Claustrophobia. Denies complaints or  symptoms of Suicidal. Objective Constitutional Hypertensive, asymptomatic. No acute distress. Vitals Time Taken: 1:25 PM, Height: 72 in, Source: Stated, Weight: 152 lbs, Source: Stated, BMI: 20.6, Temperature: 98.6 F, Pulse: 87 bpm, Respiratory Rate: 18 breaths/min, Blood Pressure: 159/83 mmHg. Respiratory Normal work of breathing on room air.. Musculoskeletal Extensive gouty tophi. General Notes: 02/03/2022: On the dorsolateral surface of his left foot, there is a large wound. There is evidence of perimeter epithelialization. Eschar and slough are present on the wound surface. There is good granulation tissue forming. No concern for infection. Integumentary (Hair, Skin) Wound #1 status is Open. Original cause of wound was Blister. The date acquired was: 11/19/2021. The wound is located on the Left,Dorsal Foot. The wound measures 8.1cm length x 2.5cm width x 0.1cm depth; 15.904cm^2 area and 1.59cm^3 volume. There is Fat Layer (Subcutaneous Tissue) exposed. There is no tunneling or undermining noted. There is a medium amount of serosanguineous drainage noted. The wound margin is distinct with the outline attached to the wound base. There is large (67-100%) red granulation within the wound bed. There is a small (1-33%) amount of necrotic tissue within the wound bed including Adherent Slough. Assessment Active Problems ICD-10 Non-pressure chronic ulcer of other part of left foot with fat layer exposed Chronic kidney disease, stage 4 (severe) Chronic gout, unspecified, with tophus (  tophi) Localized edema Unspecified severe protein-calorie malnutrition Essential (primary) hypertension Procedures Wound #1 Pre-procedure diagnosis of Wound #1 is an Atypical located on the Left,Dorsal Foot . There was a Selective/Open Wound Non-Viable Tissue Debridement with a total area of 20.25 sq cm performed by Fredirick Maudlin, MD. With the following instrument(s): Curette to remove Non-Viable  tissue/material. Material removed includes Eschar and Slough and after achieving pain control using Lidocaine 5% topical ointment. No specimens were taken. A time out was conducted at 14:01, prior to the start of the procedure. A Minimum amount of bleeding was controlled with Pressure. The procedure was tolerated well with a pain level of 2 throughout and a pain level of 0 following the procedure. Post Debridement Measurements: 8.1cm length x 2.5cm width x 0.1cm depth; 1.59cm^3 volume. Character of Wound/Ulcer Post Debridement is improved. Post procedure Diagnosis Wound #1: Same as Pre-Procedure Plan Follow-up Appointments: Return Appointment in 1 week. - Dr. Celine Ahr - room 2 - 8/24 at 3:00 PM WOUND #1: - Foot Wound Laterality: Dorsal, Left Cleanser: Soap and Water Every Other Day/30 Days Discharge Instructions: May shower and wash wound with dial antibacterial soap and water prior to dressing change. Cleanser: Wound Cleanser Every Other Day/30 Days Discharge Instructions: Cleanse the wound with wound cleanser prior to applying a clean dressing using gauze sponges, not tissue or cotton balls. Prim Dressing: SILVERCEL Alginate Dressing, 4x8 (in/in) (DME) (Generic) Every Other Day/30 Days ary Secondary Dressing: Woven Gauze Sponge, Non-Sterile 4x4 in (DME) (Generic) Every Other Day/30 Days Discharge Instructions: Apply over primary dressing as directed. Secured With: Elastic Bandage 4 inch (ACE bandage) (DME) (Generic) Every Other Day/30 Days Discharge Instructions: Secure with ACE bandage as directed. Secured With: [employer], 4.5x3.1 (in/yd) (DME) (Generic) Every Other Day/30 Days Discharge Instructions: Secure with Kerlix as directed. Secured With: 19M Medipore Public affairs consultant Surgical T 2x10 (in/yd) (DME) (Generic) Every Other Day/30 Days ape Discharge Instructions: Secure with tape as directed. 02/03/2022: This is a 66 year old man who developed severe swelling in his left leg that  resulted in his skin breaking down on the top of his foot. On the dorsolateral surface of his left foot, there is a large wound. There is evidence of perimeter epithelialization. Eschar and slough are present on the wound surface. There is good granulation tissue forming. No concern for infection. I used a curette to debride eschar and slough from the wound. We will use silver alginate with Kerlix and Ace wrap. Follow-up in 1 week. Electronic Signature(s) Signed: 02/03/2022 2:23:10 PM By: Fredirick Maudlin MD FACS Entered By: Fredirick Maudlin on 02/03/2022 14:23:10 -------------------------------------------------------------------------------- HxROS Details Patient Name: Date of Service: RO Clent Demark, RO NA LD 02/03/2022 1:30 PM Medical Record Number: [id number] Patient Account Number: [id number] Date of Birth/Sex: Treating RN: December 26, 1955 (66 y.o. Janyth Contes Primary Care Provider: Karle Plumber Other Clinician: Referring Provider: Treating Provider/Extender: Betsy Pries in Treatment: 0 Information Obtained From Patient Constitutional Symptoms ([place]) Complaints and Symptoms: Negative for: Fatigue; Fever; Chills; Marked Weight Change Eyes Complaints and Symptoms: Positive for: Glasses / Contacts - glasses Ear/Nose/Mouth/Throat Complaints and Symptoms: Negative for: Chronic sinus problems or rhinitis Respiratory Complaints and Symptoms: Negative for: Chronic or frequent coughs; Shortness of Breath Gastrointestinal Complaints and Symptoms: Negative for: Frequent diarrhea; Nausea; Vomiting Endocrine Complaints and Symptoms: Negative for: Heat/cold intolerance Integumentary (Skin) Complaints and Symptoms: Positive for: Wounds Musculoskeletal Complaints and Symptoms: Negative for: Muscle Pain; Muscle Weakness Medical History: Positive for: Gout Neurologic Complaints and Symptoms: Negative for:  Numbness/parasthesias  Psychiatric Complaints and Symptoms: Positive for: Claustrophobia Negative for: Suicidal Hematologic/Lymphatic Medical History: Positive for: Anemia Past Medical History Notes: vitamin B12 deficiency, iron deficiency Cardiovascular Medical History: Positive for: Arrhythmia - paroxysmal atrial fibrillation; Hypertension Genitourinary Medical History: Past Medical History Notes: CKD stage IV Immunological Oncologic Immunizations Pneumococcal Vaccine: Received Pneumococcal Vaccination: No Implantable Devices None Family and Social History Cancer: No; Diabetes: Yes - Mother; Heart Disease: Yes - Mother; Hereditary Spherocytosis: No; Hypertension: Yes - Mother; Kidney Disease: Yes - Siblings; Lung Disease: No; Seizures: No; Stroke: No; Thyroid Problems: No; Tuberculosis: No; Current some day smoker - "5-6 in 3 days"; Marital Status - Divorced; Alcohol Use: Rarely; Drug Use: No History; Caffeine Use: Daily; Financial Concerns: No; Food, Clothing or Shelter Needs: No; Support System Lacking: No; Transportation Concerns: No Electronic Signature(s) Signed: 02/03/2022 2:43:32 PM By: Fredirick Maudlin MD FACS Signed: 02/03/2022 4:13:50 PM By: Adline Peals Entered By: Adline Peals on 02/03/2022 13:34:32 -------------------------------------------------------------------------------- SuperBill Details Patient Name: Date of Service: Dayton Martes, RO NA LD 02/03/2022 Medical Record Number: [id number] Patient Account Number: [id number] Date of Birth/Sex: Treating RN: 1955/12/23 (66 y.o. Janyth Contes Primary Care Provider: Karle Plumber Other Clinician: Referring Provider: Treating Provider/Extender: Betsy Pries in Treatment: 0 Diagnosis Coding ICD-10 Codes Code Description [phone] Non-pressure chronic ulcer of other part of left foot with fat layer exposed N18.4 Chronic kidney disease, stage 4 (severe) M1A.9XX1  Chronic gout, unspecified, with tophus (tophi) R60.0 Localized edema E43 Unspecified severe protein-calorie malnutrition I10 Essential (primary) hypertension Facility Procedures CPT4 Code: [id number] Description: 99213 - WOUND CARE VISIT-LEV 3 EST PT Modifier: 25 Quantity: 1 CPT4 Code: [id number] Description: 17408 - DEBRIDE WOUND 1ST 20 SQ CM OR < ICD-10 Diagnosis Description L97.522 Non-pressure chronic ulcer of other part of left foot with fat layer exposed Modifier: Quantity: 1 CPT4 Code: [id number] Description: 31497 - DEBRIDE WOUND EA ADDL 20 SQ CM ICD-10 Diagnosis Description L97.522 Non-pressure chronic ulcer of other part of left foot with fat layer exposed Modifier: Quantity: 1 Physician Procedures : CPT4 Code Description Modifier [id number] - WC PHYS LEVEL 4 - NEW PT 25 ICD-10 Diagnosis Description L97.522 Non-pressure chronic ulcer of other part of left foot with fat layer exposed M1A.9XX1 Chronic gout, unspecified, with tophus (tophi) R60.0  Localized edema E43 Unspecified severe protein-calorie malnutrition Quantity: 1 : [id number] - WC PHYS DEBR WO ANESTH 20 SQ CM 1 ICD-10 Diagnosis Description L97.522 Non-pressure chronic ulcer of other part of left foot with fat layer exposed Quantity: : [id number] - WC PHYS DEBR WO ANESTH EA ADD 20 CM 1 ICD-10 Diagnosis Description L97.522 Non-pressure chronic ulcer of other part of left foot with fat layer exposed Quantity: Electronic Signature(s) Signed: 02/03/2022 2:23:44 PM By: Fredirick Maudlin MD FACS Entered By: Fredirick Maudlin on 02/03/2022 14:23:44

## 2022-02-04 NOTE — Progress Notes (Signed)
SIRE, POET ([id number]) Visit Report for 02/03/2022 Allergy List Details Patient Name: Date of Service: Brad [name], [place] LD 02/03/2022 1:30 PM Medical Record Number: [id number] Patient Account Number: [id number] Date of Birth/Sex: Treating RN: Mar 13, 1956 (66 y.o. Janyth Contes Primary Care Darely Becknell: Karle Plumber Other Clinician: Referring Torre Pikus: Treating Nikole Swartzentruber/Extender: Betsy Pries in Treatment: 0 Allergies Active Allergies No Known Allergies Allergy Notes Electronic Signature(s) Signed: 02/03/2022 4:13:50 PM By: Adline Peals Entered By: Adline Peals on 02/03/2022 13:25:49 -------------------------------------------------------------------------------- Arrival Information Details Patient Name: Date of Service: Brad [name], RO NA LD 02/03/2022 1:30 PM Medical Record Number: [id number] Patient Account Number: [id number] Date of Birth/Sex: Treating RN: 1955/07/22 (66 y.o. Janyth Contes Primary Care Lynnda Wiersma: Karle Plumber Other Clinician: Referring Bergen Melle: Treating Karesa Maultsby/Extender: Betsy Pries in Treatment: 0 Visit Information Patient Arrived: Ambulatory Arrival Time: 13:22 Accompanied By: self Transfer Assistance: None Patient Identification Verified: Yes Secondary Verification Process Completed: No Patient Has Alerts: Yes Patient Alerts: Patient on Blood Thinner Electronic Signature(s) Signed: 02/03/2022 4:13:50 PM By: Adline Peals Entered By: Adline Peals on 02/03/2022 13:24:49 -------------------------------------------------------------------------------- Clinic Level of Care Assessment Details Patient Name: Date of Service: Brad [name], [place] NA LD 02/03/2022 1:30 PM Medical Record Number: [id number] Patient Account Number: [id number] Date of Birth/Sex: Treating RN: 1955-07-26 (66 y.o. Janyth Contes Primary Care Gracen Southwell: Karle Plumber Other  Clinician: Referring Sharalee Witman: Treating Sonali Wivell/Extender: Betsy Pries in Treatment: 0 Clinic Level of Care Assessment Items TOOL 1 Quantity Score X- 1 0 Use when EandM and Procedure is performed on INITIAL visit ASSESSMENTS - Nursing Assessment / Reassessment X- 1 20 General Physical Exam (combine w/ comprehensive assessment (listed just below) when performed on new pt. evals) X- 1 25 Comprehensive Assessment (HX, ROS, Risk Assessments, Wounds Hx, etc.) ASSESSMENTS - Wound and Skin Assessment / Reassessment []  - 0 Dermatologic / Skin Assessment (not related to wound area) ASSESSMENTS - Ostomy and/or Continence Assessment and Care []  - 0 Incontinence Assessment and Management []  - 0 Ostomy Care Assessment and Management (repouching, etc.) PROCESS - Coordination of Care X - Simple Patient / Family Education for ongoing care 1 15 []  - 0 Complex (extensive) Patient / Family Education for ongoing care X- 1 10 Staff obtains Programmer, systems, Records, T Results / Process Orders est []  - 0 Staff telephones HHA, Nursing Homes / Clarify orders / etc []  - 0 Routine Transfer to another Facility (non-emergent condition) []  - 0 Routine Hospital Admission (non-emergent condition) X- 1 15 New Admissions / Biomedical engineer / Ordering NPWT Apligraf, etc. , []  - 0 Emergency Hospital Admission (emergent condition) PROCESS - Special Needs []  - 0 Pediatric / Minor Patient Management []  - 0 Isolation Patient Management []  - 0 Hearing / Language / Visual special needs []  - 0 Assessment of Community assistance (transportation, D/C planning, etc.) []  - 0 Additional assistance / Altered mentation []  - 0 Support Surface(s) Assessment (bed, cushion, seat, etc.) INTERVENTIONS - Miscellaneous []  - 0 External ear exam []  - 0 Patient Transfer (multiple staff / Civil Service fast streamer / Similar devices) []  - 0 Simple Staple / Suture removal (25 or less) []  - 0 Complex  Staple / Suture removal (26 or more) []  - 0 Hypo/Hyperglycemic Management (do not check if billed separately) X- 1 15 Ankle / Brachial Index (ABI) - do not check if billed separately Has the patient been seen at the hospital within the last three years: Yes Total Score: 100 Level Of Care: New/Established - Level 3  Electronic Signature(s) Signed: 02/03/2022 4:13:50 PM By: Sabas Sous By: Adline Peals on 02/03/2022 14:11:47 -------------------------------------------------------------------------------- Encounter Discharge Information Details Patient Name: Date of Service: RO Clent Demark, RO NA LD 02/03/2022 1:30 PM Medical Record Number: [id number] Patient Account Number: [id number] Date of Birth/Sex: Treating RN: 1955/07/07 (66 y.o. Janyth Contes Primary Care Florina Glas: Karle Plumber Other Clinician: Referring Everline Mahaffy: Treating Ronnica Dreese/Extender: Betsy Pries in Treatment: 0 Encounter Discharge Information Items Post Procedure Vitals Discharge Condition: Stable Temperature (F): 98.6 Ambulatory Status: Ambulatory Pulse (bpm): 87 Discharge Destination: Home Respiratory Rate (breaths/min): 18 Transportation: Private Auto Blood Pressure (mmHg): 159/83 Accompanied By: self Schedule Follow-up Appointment: Yes Clinical Summary of Care: Patient Declined Electronic Signature(s) Signed: 02/03/2022 4:13:50 PM By: Adline Peals Entered By: Adline Peals on 02/03/2022 14:13:04 -------------------------------------------------------------------------------- Lower Extremity Assessment Details Patient Name: Date of Service: Brad [name], [place] NA LD 02/03/2022 1:30 PM Medical Record Number: [id number] Patient Account Number: [id number] Date of Birth/Sex: Treating RN: 1955-07-18 (66 y.o. Janyth Contes Primary Care Makeya Hilgert: Karle Plumber Other Clinician: Referring Dasan Hardman: Treating Abdulwahab Demelo/Extender: Betsy Pries in Treatment: 0 Edema Assessment Assessed: Shirlyn Goltz: No] [Right: No] E[Left: dema] [Right: :] Calf Left: Right: Point of Measurement: From Medial Instep 33.6 cm Ankle Left: Right: Point of Measurement: From Medial Instep 23 cm Vascular Assessment Pulses: Dorsalis Pedis Palpable: [Left:No] Blood Pressure: Brachial: [Left:159] Ankle: [Left:Dorsalis Pedis: 164 1.03] Electronic Signature(s) Signed: 02/03/2022 4:13:50 PM By: Adline Peals Entered By: Adline Peals on 02/03/2022 13:42:29 -------------------------------------------------------------------------------- Multi Wound Chart Details Patient Name: Date of Service: Brad [name], RO NA LD 02/03/2022 1:30 PM Medical Record Number: [id number] Patient Account Number: [id number] Date of Birth/Sex: Treating RN: [birth year] (66 y.o. Janyth Contes Primary Care Tandi Hanko: Karle Plumber Other Clinician: Referring Sahvannah Rieser: Treating Tationna Fullard/Extender: Betsy Pries in Treatment: 0 Vital Signs Height(in): 72 Pulse(bpm): 35 Weight(lbs): 152 Blood Pressure(mmHg): 159/83 Body Mass Index(BMI): 20.6 Temperature(F): 98.6 Respiratory Rate(breaths/min): 18 Photos: [N/A:N/A] Left, Dorsal Foot N/A N/A Wound Location: Blister N/A N/A Wounding Event: Arterial Insufficiency Ulcer N/A N/A Primary Etiology: Anemia, Arrhythmia, Hypertension, N/A N/A Comorbid History: Gout 11/19/2021 N/A N/A Date Acquired: 0 N/A N/A Weeks of Treatment: Open N/A N/A Wound Status: No N/A N/A Wound Recurrence: 8.1x2.5x0.1 N/A N/A Measurements L x W x D (cm) 15.904 N/A N/A A (cm) : rea 1.59 N/A N/A Volume (cm) : 0.00% N/A N/A % Reduction in A rea: 0.00% N/A N/A % Reduction in Volume: Full Thickness Without Exposed N/A N/A Classification: Support Structures Medium N/A N/A Exudate A mount: Serosanguineous N/A N/A Exudate Type: red, brown N/A N/A Exudate  Color: Distinct, outline attached N/A N/A Wound Margin: Large (67-100%) N/A N/A Granulation A mount: Red N/A N/A Granulation Quality: Small (1-33%) N/A N/A Necrotic A mount: Fat Layer (Subcutaneous Tissue): Yes N/A N/A Exposed Structures: Fascia: No Tendon: No Muscle: No Joint: No Bone: No Small (1-33%) N/A N/A Epithelialization: Debridement - Selective/Open Wound N/A N/A Debridement: Pre-procedure Verification/Time Out 14:01 N/A N/A Taken: Lidocaine 5% topical ointment N/A N/A Pain Control: Necrotic/Eschar, Slough N/A N/A Tissue Debrided: Non-Viable Tissue N/A N/A Level: 20.25 N/A N/A Debridement A (sq cm): rea Curette N/A N/A Instrument: Minimum N/A N/A Bleeding: Pressure N/A N/A Hemostasis A chieved: 2 N/A N/A Procedural Pain: 0 N/A N/A Post Procedural Pain: Procedure was tolerated well N/A N/A Debridement Treatment Response: 8.1x2.5x0.1 N/A N/A Post Debridement Measurements L x W x D (cm) 1.59 N/A N/A Post Debridement Volume: (cm) Debridement N/A N/A Procedures Performed: Treatment Notes Electronic Signature(s)  Signed: 02/03/2022 2:06:47 PM By: Fredirick Maudlin MD FACS Signed: 02/03/2022 4:13:50 PM By: Sabas Sous By: Fredirick Maudlin on 02/03/2022 14:06:46 -------------------------------------------------------------------------------- Multi-Disciplinary Care Plan Details Patient Name: Date of Service: Brad [name], [place] NA LD 02/03/2022 1:30 PM Medical Record Number: [id number] Patient Account Number: [id number] Date of Birth/Sex: Treating RN: 1955-12-28 (66 y.o. Janyth Contes Primary Care Chessa Barrasso: Karle Plumber Other Clinician: Referring Bedelia Pong: Treating Gianah Batt/Extender: Betsy Pries in Treatment: 0 Active Inactive Wound/Skin Impairment Nursing Diagnoses: Impaired tissue integrity Knowledge deficit related to ulceration/compromised skin integrity Goals: Patient will demonstrate a  reduced rate of smoking or cessation of smoking Date Initiated: 02/03/2022 Target Resolution Date: 03/26/2022 Goal Status: Active Patient/caregiver will verbalize understanding of skin care regimen Date Initiated: 02/03/2022 Target Resolution Date: 03/26/2022 Goal Status: Active Interventions: Assess patient/caregiver ability to obtain necessary supplies Treatment Activities: Skin care regimen initiated : 02/03/2022 Topical wound management initiated : 02/03/2022 Notes: Electronic Signature(s) Signed: 02/03/2022 4:13:50 PM By: Adline Peals Entered By: Adline Peals on 02/03/2022 13:46:21 -------------------------------------------------------------------------------- Pain Assessment Details Patient Name: Date of Service: Brad [name], RO NA LD 02/03/2022 1:30 PM Medical Record Number: [id number] Patient Account Number: [id number] Date of Birth/Sex: Treating RN: January 17, 1956 (66 y.o. Janyth Contes Primary Care Makylee Sanborn: Karle Plumber Other Clinician: Referring Celedonio Sortino: Treating Johnnisha Forton/Extender: Betsy Pries in Treatment: 0 Active Problems Location of Pain Severity and Description of Pain Patient Has Paino No Site Locations Rate the pain. Current Pain Level: 0 Pain Management and Medication Current Pain Management: Electronic Signature(s) Signed: 02/03/2022 4:13:50 PM By: Adline Peals Entered By: Adline Peals on 02/03/2022 13:43:32 -------------------------------------------------------------------------------- Patient/Caregiver Education Details Patient Name: Date of Service: Brad [name], RO NA LD 8/16/2023andnbsp1:30 PM Medical Record Number: [id number] Patient Account Number: [id number] Date of Birth/Gender: Treating RN: 1955-10-29 (66 y.o. Janyth Contes Primary Care Physician: Karle Plumber Other Clinician: Referring Physician: Treating Physician/Extender: Betsy Pries in  Treatment: 0 Education Assessment Education Provided To: Patient Education Topics Provided Welcome T [facility]: o Handouts: Welcome T [facility] o Methods: Explain/Verbal, Printed Responses: Reinforcements needed, State content correctly Wound/Skin Impairment: Handouts: Caring for Your Ulcer, Smoking and Wound Healing Methods: Explain/Verbal, Printed Responses: Reinforcements needed, State content correctly Electronic Signature(s) Signed: 02/03/2022 4:13:50 PM By: Adline Peals Entered By: Adline Peals on 02/03/2022 13:48:26 -------------------------------------------------------------------------------- Wound Assessment Details Patient Name: Date of Service: Brad [name], RO NA LD 02/03/2022 1:30 PM Medical Record Number: [id number] Patient Account Number: [id number] Date of Birth/Sex: Treating RN: 1955-08-01 (66 y.o. Janyth Contes Primary Care Yuriko Portales: Karle Plumber Other Clinician: Referring Timisha Mondry: Treating Oaklyn Jakubek/Extender: Betsy Pries in Treatment: 0 Wound Status Wound Number: 1 Primary Etiology: Arterial Insufficiency Ulcer Wound Location: Left, Dorsal Foot Wound Status: Open Wounding Event: Blister Comorbid History: Anemia, Arrhythmia, Hypertension, Gout Date Acquired: 11/19/2021 Weeks Of Treatment: 0 Clustered Wound: No Photos Wound Measurements Length: (cm) 8.1 Width: (cm) 2.5 Depth: (cm) 0.1 Area: (cm) 15.904 Volume: (cm) 1.59 % Reduction in Area: 0% % Reduction in Volume: 0% Epithelialization: Small (1-33%) Tunneling: No Undermining: No Wound Description Classification: Full Thickness Without Exposed Support Structures Wound Margin: Distinct, outline attached Exudate Amount: Medium Exudate Type: Serosanguineous Exudate Color: red, brown Foul Odor After Cleansing: No Slough/Fibrino Yes Wound Bed Granulation Amount: Large (67-100%) Exposed Structure Granulation Quality:  Red Fascia Exposed: No Necrotic Amount: Small (1-33%) Fat Layer (Subcutaneous Tissue) Exposed: Yes Necrotic Quality: Adherent Slough Tendon Exposed: No Muscle Exposed: No Joint Exposed: No Bone Exposed: No  Electronic Signature(s) Signed: 02/03/2022 4:13:50 PM By: Sabas Sous By: Adline Peals on 02/03/2022 13:47:24 -------------------------------------------------------------------------------- Vitals Details Patient Name: Date of Service: RO Clent Demark, RO NA LD 02/03/2022 1:30 PM Medical Record Number: [id number] Patient Account Number: [id number] Date of Birth/Sex: Treating RN: 04-Apr-1956 (66 y.o. Janyth Contes Primary Care Dequavious Harshberger: Karle Plumber Other Clinician: Referring Mika Anastasi: Treating Elize Pinon/Extender: Betsy Pries in Treatment: 0 Vital Signs Time Taken: 13:25 Temperature (F): 98.6 Height (in): 72 Pulse (bpm): 87 Source: Stated Respiratory Rate (breaths/min): 18 Weight (lbs): 152 Blood Pressure (mmHg): 159/83 Source: Stated Reference Range: 80 - 120 mg / dl Body Mass Index (BMI): 20.6 Electronic Signature(s) Signed: 02/03/2022 4:13:50 PM By: Adline Peals Entered By: Adline Peals on 02/03/2022 13:30:03

## 2022-02-07 ENCOUNTER — Emergency Department (HOSPITAL_COMMUNITY)
Admission: EM | Admit: 2022-02-07 | Discharge: 2022-02-07 | Disposition: A | Discharge status: Home / Self Care | Payer: No Typology Code available for payment source | Attending: Emergency Medicine | Admitting: Emergency Medicine

## 2022-02-07 ENCOUNTER — Emergency Department (HOSPITAL_COMMUNITY): Payer: No Typology Code available for payment source

## 2022-02-07 ENCOUNTER — Encounter (HOSPITAL_COMMUNITY): Payer: Self-pay

## 2022-02-07 ENCOUNTER — Encounter: Payer: Self-pay | Admitting: Hematology and Oncology

## 2022-02-07 ENCOUNTER — Other Ambulatory Visit: Payer: Self-pay

## 2022-02-07 DIAGNOSIS — I4891 Unspecified atrial fibrillation: Secondary | ICD-10-CM | POA: Insufficient documentation

## 2022-02-07 DIAGNOSIS — N189 Chronic kidney disease, unspecified: Secondary | ICD-10-CM | POA: Insufficient documentation

## 2022-02-07 DIAGNOSIS — Z7901 Long term (current) use of anticoagulants: Secondary | ICD-10-CM | POA: Insufficient documentation

## 2022-02-07 DIAGNOSIS — M109 Gout, unspecified: Secondary | ICD-10-CM

## 2022-02-07 DIAGNOSIS — M1A9XX1 Chronic gout, unspecified, with tophus (tophi): Secondary | ICD-10-CM

## 2022-02-07 LAB — BASIC METABOLIC PANEL
Anion gap: 10 (ref 5–15)
BUN: 24 mg/dL — ABNORMAL HIGH (ref 8–23)
CO2: 16 mmol/L — ABNORMAL LOW (ref 22–32)
Calcium: 8.7 mg/dL — ABNORMAL LOW (ref 8.9–10.3)
Chloride: 106 mmol/L (ref 98–111)
Creatinine, Ser: 2.11 mg/dL — ABNORMAL HIGH (ref 0.61–1.24)
GFR, Estimated: 34 mL/min — ABNORMAL LOW (ref >60)
Glucose, Bld: 195 mg/dL — ABNORMAL HIGH (ref 70–99)
Potassium: 3.9 mmol/L (ref 3.5–5.1)
Sodium: 132 mmol/L — ABNORMAL LOW (ref 135–145)

## 2022-02-07 LAB — SEDIMENTATION RATE: Sed Rate: 96 mm/hr — ABNORMAL HIGH (ref 0–16)

## 2022-02-07 LAB — CBC
HCT: 31.6 % — ABNORMAL LOW (ref 39.0–52.0)
Hemoglobin: 10.7 g/dL — ABNORMAL LOW (ref 13.0–17.0)
MCH: 30.9 pg (ref 26.0–34.0)
MCHC: 33.9 g/dL (ref 30.0–36.0)
MCV: 91.3 fL (ref 80.0–100.0)
Platelets: 441 10*3/uL — ABNORMAL HIGH (ref 150–400)
RBC: 3.46 MIL/uL — ABNORMAL LOW (ref 4.22–5.81)
RDW: 16.6 % — ABNORMAL HIGH (ref 11.5–15.5)
WBC: 10.8 10*3/uL — ABNORMAL HIGH (ref 4.0–10.5)
nRBC: 0 % (ref 0.0–0.2)

## 2022-02-07 LAB — LACTIC ACID, PLASMA: Lactic Acid, Venous: 0.9 mmol/L (ref 0.5–1.9)

## 2022-02-07 LAB — URIC ACID: Uric Acid, Serum: 10.4 mg/dL — ABNORMAL HIGH (ref 3.7–8.6)

## 2022-02-07 LAB — C-REACTIVE PROTEIN: CRP: 21.1 mg/dL — ABNORMAL HIGH (ref <1.0)

## 2022-02-07 MED ORDER — OXYCODONE-ACETAMINOPHEN 5-325 MG PO TABS
2.0000 | ORAL_TABLET | Freq: Once | ORAL | Status: AC
  Administered 2022-02-07: 2 via ORAL
  Filled 2022-02-07: qty 2

## 2022-02-07 MED ORDER — METHYLPREDNISOLONE SODIUM SUCC 125 MG IJ SOLR
125.0000 mg | Freq: Once | INTRAMUSCULAR | Status: AC
  Administered 2022-02-07: 125 mg via INTRAVENOUS
  Filled 2022-02-07: qty 2

## 2022-02-07 MED ORDER — PREDNISONE 50 MG PO TABS: ORAL_TABLET | ORAL | 0 refills | Status: DC

## 2022-02-07 MED ORDER — COLCHICINE 0.6 MG PO TABS: 0.3000 mg | ORAL_TABLET | Freq: Every day | ORAL | 3 refills | Status: AC

## 2022-02-07 MED ORDER — OXYCODONE-ACETAMINOPHEN 5-325 MG PO TABS: 1.0000 | ORAL_TABLET | ORAL | 0 refills | Status: DC | PRN

## 2022-02-07 NOTE — ED Provider Triage Note (Signed)
Emergency Medicine Provider Triage Evaluation Note  Luay Balding , a 66 y.o. male  was evaluated in triage.  Pt complains of left hand swelling since Wednesday.  Patient has erythema, swelling, tenderness to palpation present.  Reports history of gout.  Has not had gout in his left hand before.  Without fever, chills.  Review of Systems  Positive: As above Negative: As above  Physical Exam  BP (!) 156/90 (BP Location: Right Arm)   Pulse 86   Temp 98 F (36.7 C) (Oral)   Resp 18   Ht 6' (1.829 m)   Wt 65.8 kg   SpO2 100%   BMI 19.67 kg/m  Gen:   Awake, no distress   Resp:  Normal effort  MSK:   Moves extremities without difficulty  Other:    Medical Decision Making  Medically screening exam initiated at 1:59 PM.  Appropriate orders placed.  Luismario Coston was informed that the remainder of the evaluation will be completed by another provider, this initial triage assessment does not replace that evaluation, and the importance of remaining in the ED until their evaluation is complete.     Evlyn Courier, PA-C 02/07/22 1401

## 2022-02-07 NOTE — ED Triage Notes (Signed)
Pt arrived POV from home c/o a gout flare up in his left knee and then his left hand. Pt states he noticed it was painful on Thursday and now is swollen and red unsure if the hand is also gout but he knows for sure that is what is going on in his left knee.

## 2022-02-07 NOTE — ED Provider Notes (Signed)
[facility] EMERGENCY DEPARTMENT Provider Note   CSN: [id number] Arrival date & time: 02/07/22  1244     History  Chief Complaint  Patient presents with   Gout   hand swelling    Brad [name] is a 66 y.o. male.  Pt complains of swelling to his left hand.  Pt reports he has a history of gout. Pt reports he thinks he had an ingrown nail on his left ring finger.  Pt reports he hit the tip of his finger at work on Tuesday.  Pt worried that he has infection.  Pt denies fever or chills.  Pt has a chronic kidney disease and afib.    The history is provided by the patient. No language interpreter was used.  Hand Pain This is a new problem. The current episode started more than 2 days ago. The problem occurs constantly. The problem has been gradually worsening. Pertinent negatives include no chest pain and no abdominal pain. Nothing aggravates the symptoms. Nothing relieves the symptoms. He has tried nothing for the symptoms.       Home Medications Prior to Admission medications   Medication Sig Start Date End Date Taking? Authorizing Provider  oxyCODONE-acetaminophen (PERCOCET) 5-325 MG tablet Take 1 tablet by mouth every 4 (four) hours as needed for severe pain. 02/07/22 02/07/23 Yes Fransico Meadow, PA-C  predniSONE (DELTASONE) 50 MG tablet One tablet a day 02/07/22  Yes Fransico Meadow, PA-C  amLODipine (NORVASC) 5 MG tablet Take 1 tablet (5 mg total) by mouth daily. 01/13/22   Argentina Donovan, PA-C  apixaban (ELIQUIS) 5 MG TABS tablet Take 1 tablet (5 mg total) by mouth 2 (two) times daily. 04/02/21   Jerline Pain, MD  colchicine 0.6 MG tablet Take 0.5 tablets (0.3 mg total) by mouth daily. Take for 7 days, STOP if experiencing GI upset, diarrhea or the acute gout flare has resolved. 02/07/22   Fransico Meadow, PA-C  [facility] VITAMIN B12 1000 MCG tablet TAKE 1 TABLET BY MOUTH EVERY DAY Patient taking differently: Take 500 mcg by mouth daily. 06/28/21   Orson Slick,  MD  doxycycline (VIBRAMYCIN) 100 MG capsule Take 1 capsule (100 mg total) by mouth 2 (two) times daily. One po bid x 7 days 01/03/22   Elnora Morrison, MD  febuxostat (ULORIC) 40 MG tablet Take 1 tablet (40 mg total) by mouth daily. 10/30/21   Rice, Resa Miner, MD  mupirocin ointment (BACTROBAN) 2 % Apply 1 Application topically 2 (two) times daily. 01/13/22   Argentina Donovan, PA-C  sodium bicarbonate 325 MG tablet Take 1 tablet (325 mg total) by mouth 2 (two) times daily. 11/13/21   Ladell Pier, MD  sodium zirconium cyclosilicate (LOKELMA) 5 g packet Take 5 g by mouth daily. 11/09/21   Ladell Pier, MD  traMADol (ULTRAM) 50 MG tablet Take 1 tablet (50 mg total) by mouth every 8 (eight) hours as needed for moderate pain or severe pain. 11/20/21 11/20/22  Mendel Corning, MD      Allergies    Patient has no known allergies.    Review of Systems   Review of Systems  Cardiovascular:  Negative for chest pain.  Gastrointestinal:  Negative for abdominal pain.  Musculoskeletal:  Positive for arthralgias, joint swelling and myalgias.  All other systems reviewed and are negative.   Physical Exam Updated Vital Signs BP 133/85 (BP Location: Right Arm)   Pulse 96   Temp 97.9 F (36.6  C) (Oral)   Resp 16   Ht 6' (1.829 m)   Wt 65.8 kg   SpO2 99%   BMI 19.67 kg/m  Physical Exam Vitals and nursing note reviewed.  Constitutional:      General: He is not in acute distress.    Appearance: He is well-developed.  HENT:     Head: Normocephalic and atraumatic.  Eyes:     Conjunctiva/sclera: Conjunctivae normal.  Cardiovascular:     Rate and Rhythm: Normal rate and regular rhythm.     Heart sounds: No murmur heard. Pulmonary:     Effort: Pulmonary effort is normal.  Musculoskeletal:        General: Swelling and tenderness present.     Comments: Swollen tender left hand and wrist,  2nd finger swollen, 4th finger, tip of finger dark.  Good pulse, normal cap refill.    Skin:     General: Skin is warm and dry.     Capillary Refill: Capillary refill takes less than 2 seconds.  Neurological:     Mental Status: He is alert.  Psychiatric:        Mood and Affect: Mood normal.     ED Results / Procedures / Treatments   Labs (all labs ordered are listed, but only abnormal results are displayed) Labs Reviewed  CBC - Abnormal; Notable for the following components:      Result Value   WBC 10.8 (*)    RBC 3.46 (*)    Hemoglobin 10.7 (*)    HCT 31.6 (*)    RDW 16.6 (*)    Platelets 441 (*)    All other components within normal limits  BASIC METABOLIC PANEL - Abnormal; Notable for the following components:   Sodium 132 (*)    CO2 16 (*)    Glucose, Bld 195 (*)    BUN 24 (*)    Creatinine, Ser 2.11 (*)    Calcium 8.7 (*)    GFR, Estimated 34 (*)    All other components within normal limits  SEDIMENTATION RATE - Abnormal; Notable for the following components:   Sed Rate 96 (*)    All other components within normal limits  C-REACTIVE PROTEIN - Abnormal; Notable for the following components:   CRP 21.1 (*)    All other components within normal limits  URIC ACID - Abnormal; Notable for the following components:   Uric Acid, Serum 10.4 (*)    All other components within normal limits  LACTIC ACID, PLASMA  LACTIC ACID, PLASMA    EKG None  Radiology DG Hand Complete Left  Result Date: 02/07/2022 CLINICAL DATA:  Left hand pain and swelling EXAM: LEFT HAND - COMPLETE 3+ VIEW COMPARISON:  None Available. FINDINGS: There is no evidence of fracture or dislocation. There is no evidence of arthropathy or other focal bone abnormality. Diffuse soft tissue edema about the hand. Small radiopaque foreign body in the thenar aspect of the soft tissues overlying the second metacarpal, measuring no greater than 2-3 mm in projection. IMPRESSION: 1. No acute fracture or dislocation. 2. Diffuse soft tissue edema about the hand. 3. Small radiopaque foreign body in the thenar aspect  of the soft tissues overlying the second metacarpal, measuring no greater than 2-3 mm in projection. This is of uncertain acuity or significance. Electronically Signed   By: Delanna Ahmadi M.D.   On: 02/07/2022 14:50    Procedures Procedures    Medications Ordered in ED Medications  methylPREDNISolone sodium succinate (SOLU-MEDROL) 125 mg/2  mL injection 125 mg (125 mg Intravenous Given 02/07/22 2122)    ED Course/ Medical Decision Making/ A&P                           Medical Decision Making Pt complains of redness and swelling to his left hand.  Pt reports he also has gout in his left knee.  Amount and/or Complexity of Data Reviewed External Data Reviewed: labs and notes.    Details: rheumatology notes reviewed.  primary care notes reviewed Labs: ordered. Decision-making details documented in ED Course.    Details: labs ordered reviewed and interpreted.  Pt has a sed rate of 96. Uric acid is 10.4. WBC count is 10  Bun and creatine and are at baseline Radiology: ordered and independent interpretation performed. Decision-making details documented in ED Course.    Details: Xray left hand  no acute fracture Discussion of management or test interpretation with external provider(s): Dr. Mable Fill [facility] on call here to see and evaluate pt.   He advised IV solumedrol  Rx for colchicine and prednsione.  Pt advised to follow up with Dr. Mardelle Matte or with his rheumatologist this week for recheck   Risk Prescription drug management.  Reviewed and are at baseline          Final Clinical Impression(s) / ED Diagnoses Final diagnoses:  Acute gout of left hand, unspecified cause    Rx / DC Orders ED Discharge Orders          Ordered    colchicine 0.6 MG tablet  Daily        02/07/22 2104    predniSONE (DELTASONE) 50 MG tablet        02/07/22 2104    oxyCODONE-acetaminophen (PERCOCET) 5-325 MG tablet  Every 4 hours PRN        02/07/22 2104           An After Visit Summary  was printed and given to the patient.    Sidney Ace 02/07/22 2139    Isla Pence, MD 02/07/22 2318

## 2022-02-07 NOTE — Discharge Instructions (Signed)
Follow up with Dr. Mardelle Matte or your Physicain for recheck

## 2022-02-07 NOTE — Consult Note (Signed)
ORTHOPAEDIC CONSULTATION  REQUESTING PHYSICIAN: Isla Pence, MD  Chief Complaint: Left hand pain  HPI: Brad [name] is a 66 y.o. male who complains of left hand pain for the last week.  He had an injury to the left ring finger, and then noted that it was getting somewhat darker.  He then developed significant swelling and hand pain diffusely.  He normally sees [name] with rheumatology for severe gout, and was told "this is the worst case of gout that we have ever seen".  He has had gout actively in the lower extremity currently, and does have colchicine typically, although does not have it right now.  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  Past Medical History:  Diagnosis Date   Gout    Hypertension    Kidney failure    History reviewed. No pertinent surgical history. Social History   Socioeconomic History   Marital status: Single    Spouse name: Not on file   Number of children: Not on file   Years of education: Not on file   Highest education level: Not on file  Occupational History   Not on file  Tobacco Use   Smoking status: Every Day    Packs/day: 0.24    Years: 15.00    Total pack years: 3.60    Types: Cigarettes   Smokeless tobacco: Never  Vaping Use   Vaping Use: Never used  Substance and Sexual Activity   Alcohol use: Yes    Alcohol/week: 3.0 standard drinks of alcohol    Types: 3 Standard drinks or equivalent per week   Drug use: No   Sexual activity: Not on file  Other Topics Concern   Not on file  Social History Narrative   Not on file   Social Determinants of Health   Financial Resource Strain: Not on file  Food Insecurity: Not on file  Transportation Needs: Not on file  Physical Activity: Not on file  Stress: Not on file  Social Connections: Not on file   Family History  Problem Relation Age of Onset   Diabetes Mother    No Known  Allergies   Positive ROS: All other systems have been reviewed and were otherwise negative with the exception of those mentioned in the HPI and as above.  Physical Exam: BP 133/85 (BP Location: Right Arm)   Pulse 96   Temp 97.9 F (36.6 C) (Oral)   Resp 16   Ht 6' (1.829 m)   Wt 65.8 kg   SpO2 99%   BMI 19.67 kg/m   General: Alert, no acute distress Cardiovascular: Left hand has substantial soft tissue swelling diffusely, he has palpable radial and ulnar pulses and good capillary refill throughout all of the fingertips. Respiratory: No cyanosis, no use of accessory musculature GI: No organomegaly, abdomen is soft and non-tender Skin: No lesions in the area of chief complaint Neurologic: Sensation intact distally Psychiatric: Patient is competent for consent with normal mood and affect Lymphatic: No axillary or cervical lymphadenopathy  MUSCULOSKELETAL: Left ring finger has a little bit of duskiness to the skin, but the capillary refill looks excellent.  I think the skin is just bruised.  He has tophaceous depositions throughout all of his joints with substantial soft tissue swelling throughout.  All fingers do flex extend and abduct, although not normally given the swelling and pain.       Assessment:  Severe left hand gout, chronic uncontrolled gout, coexisting diabetes, peripheral vascular disease, with recent traumatic injury to the ring finger tip.  Plan: I do not believe he has an infection in his upper extremity, this looks like a severe case of gout.  Recommend IV steroids, oral steroids with discharge, close follow-up either with me within the next 3 days, possibly Wednesday, or alternatively with his primary rheumatologist.  Also discharged with colchicine.  His renal function apparently has been stable, and although he has renal impairment, his prescriptions did include colchicine as recently as last May.    Johnny Bridge, MD Cell [phone]   02/07/2022 8:58 PM

## 2022-02-11 ENCOUNTER — Encounter (HOSPITAL_COMMUNITY): Payer: Self-pay

## 2022-02-11 ENCOUNTER — Encounter: Payer: Self-pay | Admitting: Hematology and Oncology

## 2022-02-11 ENCOUNTER — Encounter (HOSPITAL_BASED_OUTPATIENT_CLINIC_OR_DEPARTMENT_OTHER): Payer: No Typology Code available for payment source | Admitting: General Surgery

## 2022-02-11 DIAGNOSIS — L97522 Non-pressure chronic ulcer of other part of left foot with fat layer exposed: Secondary | ICD-10-CM | POA: Diagnosis not present

## 2022-02-15 ENCOUNTER — Telehealth: Payer: Self-pay | Admitting: Internal Medicine

## 2022-02-15 DIAGNOSIS — M1A9XX1 Chronic gout, unspecified, with tophus (tophi): Secondary | ICD-10-CM

## 2022-02-15 NOTE — Telephone Encounter (Signed)
Patient called the office stating he went to the ED last week for a gout flare. Patient states they gave him a refill of his medications but only 5 days worth. Patient requests a refill of:   Prednisone 50mg  Colchicone 0.6mg  Oxycodone-acetaminophen 5/325mg .   Patient states the ED prescribed him 15 Oxycodone but the pharmacy only gave him 10 tablets. Patient requests we prescribe the other 5 because he is still in pain.

## 2022-02-15 NOTE — Progress Notes (Signed)
KOUROSH, JABLONSKY ([id number]) Visit Report for 02/11/2022 Chief Complaint Document Details Patient Name: Date of Service: Brad [name], [place] LD 02/11/2022 3:00 PM Medical Record Number: [id number] Patient Account Number: [id number] Date of Birth/Sex: Treating RN: 01/10/1956 (66 y.o. Janyth Contes Primary Care Provider: Karle Plumber Other Clinician: Referring Provider: Treating Provider/Extender: Annice Needy in Treatment: 1 Information Obtained from: Patient Chief Complaint Patient seen for complaints of Non-Healing Wound on his dorsal left foot. Electronic Signature(s) Signed: 02/11/2022 3:43:39 PM By: Fredirick Maudlin MD FACS Entered By: Fredirick Maudlin on 02/11/2022 15:43:39 -------------------------------------------------------------------------------- Debridement Details Patient Name: Date of Service: Brad [name], RO NA LD 02/11/2022 3:00 PM Medical Record Number: [id number] Patient Account Number: [id number] Date of Birth/Sex: Treating RN: Sep 08, 1955 (66 y.o. Waldron Session Primary Care Provider: Karle Plumber Other Clinician: Referring Provider: Treating Provider/Extender: Annice Needy in Treatment: 1 Debridement Performed for Assessment: Wound #1 Left,Dorsal Foot Performed By: Physician Fredirick Maudlin, MD Debridement Type: Debridement Level of Consciousness (Pre-procedure): Awake and Alert Pre-procedure Verification/Time Out Yes - 15:23 Taken: Start Time: 15:24 T Area Debrided (L x W): otal 7 (cm) x 1.6 (cm) = 11.2 (cm) Tissue and other material debrided: Non-Viable, Biofilm Level: Non-Viable Tissue Debridement Description: Selective/Open Wound Instrument: Curette Bleeding: Minimum Hemostasis Achieved: Pressure Procedural Pain: 0 Post Procedural Pain: 0 Response to Treatment: Procedure was tolerated well Level of Consciousness (Post- Awake and Alert procedure): Post Debridement Measurements of  Total Wound Length: (cm) 7 Width: (cm) 1.6 Depth: (cm) 0.1 Volume: (cm) 0.88 Character of Wound/Ulcer Post Debridement: Improved Post Procedure Diagnosis Same as Pre-procedure Electronic Signature(s) Signed: 02/11/2022 4:07:36 PM By: Fredirick Maudlin MD FACS Signed: 02/12/2022 7:35:53 AM By: Blanche East RN Entered By: Blanche East on 02/11/2022 15:26:02 -------------------------------------------------------------------------------- HPI Details Patient Name: Date of Service: Brad [name], RO NA LD 02/11/2022 3:00 PM Medical Record Number: [id number] Patient Account Number: [id number] Date of Birth/Sex: Treating RN: 01/08/1956 (66 y.o. Janyth Contes Primary Care Provider: Karle Plumber Other Clinician: Referring Provider: Treating Provider/Extender: Annice Needy in Treatment: 1 History of Present Illness HPI Description: ADMISSION 02/03/2022 This is a 66 year old man with a past medical history significant for stage IV chronic kidney disease, chronic tophaceous gout, and essential hypertension. He was admitted to the hospital in June with a severe gout flare. His left leg became tremendously swollen. Ultimately, after discharge, the skin on the top of his left foot broke down. He has treated this variously with Epsom salt soaks and peroxide. He was also prescribed mupirocin at 1 point. As his wound is not healing, he was referred to the wound care center for further evaluation and management. He is not diabetic. ABI in clinic today was 1.03. On the dorsolateral surface of his left foot, there is a large wound. There is evidence of perimeter epithelialization. Eschar and slough are present on the wound surface. There is good granulation tissue forming. No concern for infection. 02/11/2022: The wound has contracted considerably even in just 1 week. The surface is healthy with good granulation tissue. There is nice perimeter epithelialization. Electronic  Signature(s) Signed: 02/11/2022 3:44:12 PM By: Fredirick Maudlin MD FACS Entered By: Fredirick Maudlin on 02/11/2022 15:44:12 -------------------------------------------------------------------------------- Physical Exam Details Patient Name: Date of Service: RO Clent Demark, RO NA LD 02/11/2022 3:00 PM Medical Record Number: [id number] Patient Account Number: [id number] Date of Birth/Sex: Treating RN: [birth year] (66 y.o. Janyth Contes Primary Care Provider: Karle Plumber Other Clinician: Referring Provider: Treating Provider/Extender: Annice Needy  in Treatment: 1 Constitutional . . . . No acute distress.Marland Kitchen Respiratory Normal work of breathing on room air.. Notes 02/11/2022: The wound has contracted considerably even in just 1 week. The surface is healthy with good granulation tissue. There is nice perimeter epithelialization. Electronic Signature(s) Signed: 02/11/2022 3:44:35 PM By: Fredirick Maudlin MD FACS Entered By: Fredirick Maudlin on 02/11/2022 15:44:35 -------------------------------------------------------------------------------- Physician Orders Details Patient Name: Date of Service: RO Clent Demark, RO NA LD 02/11/2022 3:00 PM Medical Record Number: [id number] Patient Account Number: [id number] Date of Birth/Sex: Treating RN: May 10, 1956 (66 y.o. Waldron Session Primary Care Provider: Karle Plumber Other Clinician: Referring Provider: Treating Provider/Extender: Annice Needy in Treatment: 1 Verbal / Phone Orders: No Diagnosis Coding ICD-10 Coding Code Description [phone] Non-pressure chronic ulcer of other part of left foot with fat layer exposed N18.4 Chronic kidney disease, stage 4 (severe) M1A.9XX1 Chronic gout, unspecified, with tophus (tophi) R60.0 Localized edema E43 Unspecified severe protein-calorie malnutrition I10 Essential (primary) hypertension Follow-up Appointments ppointment in 1 week. - Dr.  Celine Ahr - room 4 Return A Friday 02/19/2022 at 1:45 PM Wound Treatment Wound #1 - Foot Wound Laterality: Dorsal, Left Cleanser: Soap and Water Every Other Day/30 Days Discharge Instructions: May shower and wash wound with dial antibacterial soap and water prior to dressing change. Cleanser: Wound Cleanser Every Other Day/30 Days Discharge Instructions: Cleanse the wound with wound cleanser prior to applying a clean dressing using gauze sponges, not tissue or cotton balls. Prim Dressing: SILVERCEL Alginate Dressing, 4x8 (in/in) (Generic) Every Other Day/30 Days ary Secondary Dressing: Woven Gauze Sponge, Non-Sterile 4x4 in (Generic) Every Other Day/30 Days Discharge Instructions: Apply over primary dressing as directed. Secured With: Elastic Bandage 4 inch (ACE bandage) (Generic) Every Other Day/30 Days Discharge Instructions: Secure with ACE bandage as directed. Secured With: [employer], 4.5x3.1 (in/yd) (Generic) Every Other Day/30 Days Discharge Instructions: Secure with Kerlix as directed. Secured With: 60M Medipore Public affairs consultant Surgical T 2x10 (in/yd) (Generic) Every Other Day/30 Days ape Discharge Instructions: Secure with tape as directed. Electronic Signature(s) Signed: 02/11/2022 3:47:27 PM By: Fredirick Maudlin MD FACS Entered By: Fredirick Maudlin on 02/11/2022 15:47:26 -------------------------------------------------------------------------------- Problem List Details Patient Name: Date of Service: RO Clent Demark, RO NA LD 02/11/2022 3:00 PM Medical Record Number: [id number] Patient Account Number: [id number] Date of Birth/Sex: Treating RN: Aug 29, 1955 (66 y.o. Waldron Session Primary Care Provider: Karle Plumber Other Clinician: Referring Provider: Treating Provider/Extender: Annice Needy in Treatment: 1 Active Problems ICD-10 Encounter Code Description Active Date MDM Diagnosis [phone] Non-pressure chronic ulcer of other part of left foot  with fat layer exposed 02/03/2022 No Yes N18.4 Chronic kidney disease, stage 4 (severe) 02/03/2022 No Yes M1A.9XX1 Chronic gout, unspecified, with tophus (tophi) 02/03/2022 No Yes R60.0 Localized edema 02/03/2022 No Yes E43 Unspecified severe protein-calorie malnutrition 02/03/2022 No Yes I10 Essential (primary) hypertension 02/03/2022 No Yes Inactive Problems Resolved Problems Electronic Signature(s) Signed: 02/11/2022 3:35:35 PM By: Fredirick Maudlin MD FACS Entered By: Fredirick Maudlin on 02/11/2022 15:35:34 -------------------------------------------------------------------------------- Progress Note Details Patient Name: Date of Service: RO Clent Demark, RO NA LD 02/11/2022 3:00 PM Medical Record Number: [id number] Patient Account Number: [id number] Date of Birth/Sex: Treating RN: 1956-04-14 (66 y.o. Janyth Contes Primary Care Provider: Karle Plumber Other Clinician: Referring Provider: Treating Provider/Extender: Annice Needy in Treatment: 1 Subjective Chief Complaint Information obtained from Patient Patient seen for complaints of Non-Healing Wound on his dorsal left foot. History of Present Illness (HPI) ADMISSION 02/03/2022 This is a 66 year old man with  a past medical history significant for stage IV chronic kidney disease, chronic tophaceous gout, and essential hypertension. He was admitted to the hospital in June with a severe gout flare. His left leg became tremendously swollen. Ultimately, after discharge, the skin on the top of his left foot broke down. He has treated this variously with Epsom salt soaks and peroxide. He was also prescribed mupirocin at 1 point. As his wound is not healing, he was referred to the wound care center for further evaluation and management. He is not diabetic. ABI in clinic today was 1.03. On the dorsolateral surface of his left foot, there is a large wound. There is evidence of perimeter epithelialization. Eschar and  slough are present on the wound surface. There is good granulation tissue forming. No concern for infection. 02/11/2022: The wound has contracted considerably even in just 1 week. The surface is healthy with good granulation tissue. There is nice perimeter epithelialization. Patient History Information obtained from Patient. Family History Diabetes - Mother, Heart Disease - Mother, Hypertension - Mother, Kidney Disease - Siblings, No family history of Cancer, Hereditary Spherocytosis, Lung Disease, Seizures, Stroke, Thyroid Problems, Tuberculosis. Social History Current some day smoker - "5-6 in 3 days", Marital Status - Divorced, Alcohol Use - Rarely, Drug Use - No History, Caffeine Use - Daily. Medical History Hematologic/Lymphatic Patient has history of Anemia Cardiovascular Patient has history of Arrhythmia - paroxysmal atrial fibrillation, Hypertension Musculoskeletal Patient has history of Gout Medical A Surgical History Notes nd Hematologic/Lymphatic vitamin B12 deficiency, iron deficiency Genitourinary CKD stage IV Objective Constitutional No acute distress.. Vitals Time Taken: 3:11 PM, Height: 72 in, Weight: 152 lbs, BMI: 20.6, Temperature: 98.4 F, Pulse: 86 bpm, Respiratory Rate: 18 breaths/min, Blood Pressure: 134/78 mmHg. Respiratory Normal work of breathing on room air.. General Notes: 02/11/2022: The wound has contracted considerably even in just 1 week. The surface is healthy with good granulation tissue. There is nice perimeter epithelialization. Integumentary (Hair, Skin) Wound #1 status is Open. Original cause of wound was Blister. The date acquired was: 11/19/2021. The wound has been in treatment 1 weeks. The wound is located on the Left,Dorsal Foot. The wound measures 7cm length x 1.6cm width x 0.1cm depth; 8.796cm^2 area and 0.88cm^3 volume. There is Fat Layer (Subcutaneous Tissue) exposed. There is no tunneling or undermining noted. There is a medium amount of  serosanguineous drainage noted. The wound margin is distinct with the outline attached to the wound base. There is large (67-100%) red granulation within the wound bed. There is a small (1-33%) amount of necrotic tissue within the wound bed including Adherent Slough. Assessment Active Problems ICD-10 Non-pressure chronic ulcer of other part of left foot with fat layer exposed Chronic kidney disease, stage 4 (severe) Chronic gout, unspecified, with tophus (tophi) Localized edema Unspecified severe protein-calorie malnutrition Essential (primary) hypertension Procedures Wound #1 Pre-procedure diagnosis of Wound #1 is an Atypical located on the Left,Dorsal Foot . There was a Selective/Open Wound Non-Viable Tissue Debridement with a total area of 11.2 sq cm performed by Fredirick Maudlin, MD. With the following instrument(s): Curette to remove Non-Viable tissue/material. Material removed includes Biofilm. No specimens were taken. A time out was conducted at 15:23, prior to the start of the procedure. A Minimum amount of bleeding was controlled with Pressure. The procedure was tolerated well with a pain level of 0 throughout and a pain level of 0 following the procedure. Post Debridement Measurements: 7cm length x 1.6cm width x 0.1cm depth; 0.88cm^3 volume. Character of Wound/Ulcer Post  Debridement is improved. Post procedure Diagnosis Wound #1: Same as Pre-Procedure Plan Follow-up Appointments: Return Appointment in 1 week. - Dr. Celine Ahr - room 4 Friday 02/19/2022 at 1:45 PM WOUND #1: - Foot Wound Laterality: Dorsal, Left Cleanser: Soap and Water Every Other Day/30 Days Discharge Instructions: May shower and wash wound with dial antibacterial soap and water prior to dressing change. Cleanser: Wound Cleanser Every Other Day/30 Days Discharge Instructions: Cleanse the wound with wound cleanser prior to applying a clean dressing using gauze sponges, not tissue or cotton balls. Prim Dressing:  SILVERCEL Alginate Dressing, 4x8 (in/in) (Generic) Every Other Day/30 Days ary Secondary Dressing: Woven Gauze Sponge, Non-Sterile 4x4 in (Generic) Every Other Day/30 Days Discharge Instructions: Apply over primary dressing as directed. Secured With: Elastic Bandage 4 inch (ACE bandage) (Generic) Every Other Day/30 Days Discharge Instructions: Secure with ACE bandage as directed. Secured With: [employer], 4.5x3.1 (in/yd) (Generic) Every Other Day/30 Days Discharge Instructions: Secure with Kerlix as directed. Secured With: 23M Medipore Public affairs consultant Surgical T 2x10 (in/yd) (Generic) Every Other Day/30 Days ape Discharge Instructions: Secure with tape as directed. 02/11/2022: The wound has contracted considerably even in just 1 week. The surface is healthy with good granulation tissue. There is nice perimeter epithelialization. I used a curette to remove the light layer of biofilm from the wound surface. We will continue to use silver alginate with Kerlix and Ace bandage. Follow-up in 1 week. Electronic Signature(s) Signed: 02/11/2022 3:47:51 PM By: Fredirick Maudlin MD FACS Entered By: Fredirick Maudlin on 02/11/2022 15:47:51 -------------------------------------------------------------------------------- HxROS Details Patient Name: Date of Service: RO Clent Demark, RO NA LD 02/11/2022 3:00 PM Medical Record Number: [id number] Patient Account Number: [id number] Date of Birth/Sex: Treating RN: [birth year] (66 y.o. Janyth Contes Primary Care Provider: Karle Plumber Other Clinician: Referring Provider: Treating Provider/Extender: Annice Needy in Treatment: 1 Information Obtained From Patient Hematologic/Lymphatic Medical History: Positive for: Anemia Past Medical History Notes: vitamin B12 deficiency, iron deficiency Cardiovascular Medical History: Positive for: Arrhythmia - paroxysmal atrial fibrillation; Hypertension Genitourinary Medical  History: Past Medical History Notes: CKD stage IV Musculoskeletal Medical History: Positive for: Gout Immunizations Pneumococcal Vaccine: Received Pneumococcal Vaccination: No Implantable Devices None Family and Social History Cancer: No; Diabetes: Yes - Mother; Heart Disease: Yes - Mother; Hereditary Spherocytosis: No; Hypertension: Yes - Mother; Kidney Disease: Yes - Siblings; Lung Disease: No; Seizures: No; Stroke: No; Thyroid Problems: No; Tuberculosis: No; Current some day smoker - "5-6 in 3 days"; Marital Status - Divorced; Alcohol Use: Rarely; Drug Use: No History; Caffeine Use: Daily; Financial Concerns: No; Food, Clothing or Shelter Needs: No; Support System Lacking: No; Transportation Concerns: No Engineer, maintenance) Signed: 02/11/2022 4:07:36 PM By: Fredirick Maudlin MD FACS Signed: 02/15/2022 5:02:03 PM By: Sabas Sous By: Fredirick Maudlin on 02/11/2022 15:44:17 -------------------------------------------------------------------------------- [place] Details Patient Name: Date of Service: Brad [name], RO NA LD 02/11/2022 Medical Record Number: [id number] Patient Account Number: [id number] Date of Birth/Sex: Treating RN: 02-11-1956 (66 y.o. Janyth Contes Primary Care Provider: Karle Plumber Other Clinician: Referring Provider: Treating Provider/Extender: Annice Needy in Treatment: 1 Diagnosis Coding ICD-10 Codes Code Description [phone] Non-pressure chronic ulcer of other part of left foot with fat layer exposed N18.4 Chronic kidney disease, stage 4 (severe) M1A.9XX1 Chronic gout, unspecified, with tophus (tophi) R60.0 Localized edema E43 Unspecified severe protein-calorie malnutrition I10 Essential (primary) hypertension Facility Procedures CPT4 Code: [id number] Description: [phone] - DEBRIDE WOUND 1ST 20 SQ CM OR < ICD-10 Diagnosis Description L97.522 Non-pressure chronic ulcer of other  part of left foot with fat  layer exposed Modifier: Quantity: 1 Physician Procedures : CPT4 Code Description Modifier [id number] - WC PHYS LEVEL 3 - EST PT 25 ICD-10 Diagnosis Description L97.522 Non-pressure chronic ulcer of other part of left foot with fat layer exposed N18.4 Chronic kidney disease, stage 4 (severe) R60.0 Localized  edema M1A.9XX1 Chronic gout, unspecified, with tophus (tophi) Quantity: 1 Electronic Signature(s) Signed: 02/11/2022 3:49:37 PM By: Fredirick Maudlin MD FACS Entered By: Fredirick Maudlin on 02/11/2022 15:49:36

## 2022-02-15 NOTE — Progress Notes (Signed)
BRODERICK, FONSECA ([id number]) Visit Report for 02/11/2022 Arrival Information Details Patient Name: Date of Service: Brad [name], [place] LD 02/11/2022 3:00 PM Medical Record Number: [id number] Patient Account Number: [id number] Date of Birth/Sex: Treating RN: 1955/12/04 (66 y.o. Waldron Session Primary Care Geraldina Parrott: Karle Plumber Other Clinician: Referring Ivett Luebbe: Treating Elhadj Girton/Extender: Annice Needy in Treatment: 1 Visit Information History Since Last Visit All ordered tests and consults were completed: Yes Patient Arrived: Ambulatory Added or deleted any medications: No Arrival Time: 15:10 Any new allergies or adverse reactions: No Accompanied By: self Had a fall or experienced change in No Transfer Assistance: None activities of daily living that may affect Patient Has Alerts: Yes risk of falls: Patient Alerts: Patient on Blood Thinner Signs or symptoms of abuse/neglect since last visito No Hospitalized since last visit: No Implantable device outside of the clinic excluding No cellular tissue based products placed in the center since last visit: Pain Present Now: Yes Electronic Signature(s) Signed: 02/12/2022 7:35:53 AM By: Blanche East RN Entered By: Blanche East on 02/11/2022 15:11:17 -------------------------------------------------------------------------------- Encounter Discharge Information Details Patient Name: Date of Service: Brad [name], [place] NA LD 02/11/2022 3:00 PM Medical Record Number: [id number] Patient Account Number: [id number] Date of Birth/Sex: Treating RN: 09/14/1955 (66 y.o. Waldron Session Primary Care Zelie Asbill: Karle Plumber Other Clinician: Referring Bonnita Newby: Treating Carnisha Feltz/Extender: Annice Needy in Treatment: 1 Encounter Discharge Information Items Post Procedure Vitals Discharge Condition: Stable Temperature (F): 98.3 Ambulatory Status: Ambulatory Pulse (bpm): 70 Discharge  Destination: Home Respiratory Rate (breaths/min): 18 Transportation: Private Auto Blood Pressure (mmHg): 156/74 Accompanied By: self Schedule Follow-up Appointment: Yes Clinical Summary of Care: Electronic Signature(s) Signed: 02/12/2022 7:35:53 AM By: Blanche East RN Entered By: Blanche East on 02/11/2022 16:43:47 -------------------------------------------------------------------------------- Lower Extremity Assessment Details Patient Name: Date of Service: Brad [name], [place] NA LD 02/11/2022 3:00 PM Medical Record Number: [id number] Patient Account Number: [id number] Date of Birth/Sex: Treating RN: 21-May-1956 (66 y.o. Waldron Session Primary Care Tyrome Donatelli: Karle Plumber Other Clinician: Referring Iceis Knab: Treating Shandee Jergens/Extender: Annice Needy in Treatment: 1 Edema Assessment Assessed: [Left: No] [Right: No] E[Left: dema] [Right: :] Calf Left: Right: Point of Measurement: From Medial Instep 33 cm Ankle Left: Right: Point of Measurement: From Medial Instep 23 cm Vascular Assessment Pulses: Dorsalis Pedis Palpable: [Left:Yes] Electronic Signature(s) Signed: 02/12/2022 7:35:53 AM By: Blanche East RN Entered By: Blanche East on 02/11/2022 15:16:06 -------------------------------------------------------------------------------- Multi Wound Chart Details Patient Name: Date of Service: Brad [name], [place] NA LD 02/11/2022 3:00 PM Medical Record Number: [id number] Patient Account Number: [id number] Date of Birth/Sex: Treating RN: 06-29-1955 (66 y.o. Janyth Contes Primary Care Willam Munford: Karle Plumber Other Clinician: Referring Lonzell Dorris: Treating Milas Schappell/Extender: Annice Needy in Treatment: 1 Vital Signs Height(in): 72 Pulse(bpm): 86 Weight(lbs): 152 Blood Pressure(mmHg): 134/78 Body Mass Index(BMI): 20.6 Temperature(F): 98.4 Respiratory Rate(breaths/min): 18 Photos: [1:Left, Dorsal Foot] [N/A:N/A  N/A] Wound Location: [1:Blister] [N/A:N/A] Wounding Event: [1:Atypical] [N/A:N/A] Primary Etiology: [1:Anemia, Arrhythmia, Hypertension, N/A] Comorbid History: [1:Gout 11/19/2021] [N/A:N/A] Date Acquired: [1:1] [N/A:N/A] Weeks of Treatment: [1:Open] [N/A:N/A] Wound Status: [1:No] [N/A:N/A] Wound Recurrence: [1:7x1.6x0.1] [N/A:N/A] Measurements L x W x D (cm) [1:8.796] [N/A:N/A] A (cm) : rea [1:0.88] [N/A:N/A] Volume (cm) : [1:44.70%] [N/A:N/A] % Reduction in A [1:rea: 44.70%] [N/A:N/A] % Reduction in Volume: [1:Full Thickness Without Exposed] [N/A:N/A] Classification: [1:Support Structures Medium] [N/A:N/A] Exudate A mount: [1:Serosanguineous] [N/A:N/A] Exudate Type: [1:red, brown] [N/A:N/A] Exudate Color: [1:Distinct, outline attached] [N/A:N/A] Wound Margin: [1:Large (67-100%)] [N/A:N/A] Granulation A mount: [1:Red] [N/A:N/A] Granulation  Quality: [1:Small (1-33%)] [N/A:N/A] Necrotic A mount: [1:Fat Layer (Subcutaneous Tissue): Yes N/A] Exposed Structures: [1:Fascia: No Tendon: No Muscle: No Joint: No Bone: No Small (1-33%)] [N/A:N/A] Epithelialization: [1:Debridement - Selective/Open Wound N/A] Debridement: Pre-procedure Verification/Time Out 15:23 [N/A:N/A] Taken: [1:Non-Viable Tissue] [N/A:N/A] Level: [1:11.2] [N/A:N/A] Debridement A (sq cm): [1:rea Curette] [N/A:N/A] Instrument: [1:Minimum] [N/A:N/A] Bleeding: [1:Pressure] [N/A:N/A] Hemostasis Achieved: [1:0] [N/A:N/A] Procedural Pain: [1:0] [N/A:N/A] Post Procedural Pain: [1:Procedure was tolerated well] [N/A:N/A] Debridement Treatment Response: [1:7x1.6x0.1] [N/A:N/A] Post Debridement Measurements L x W x D (cm) [1:0.88] [N/A:N/A] Post Debridement Volume: (cm) [1:Debridement] [N/A:N/A] Treatment Notes Electronic Signature(s) Signed: 02/11/2022 3:43:33 PM By: Fredirick Maudlin MD FACS Signed: 02/15/2022 5:02:03 PM By: Adline Peals Entered By: Fredirick Maudlin on 02/11/2022  15:43:33 -------------------------------------------------------------------------------- Multi-Disciplinary Care Plan Details Patient Name: Date of Service: Brad [name], [place] NA LD 02/11/2022 3:00 PM Medical Record Number: [id number] Patient Account Number: [id number] Date of Birth/Sex: Treating RN: [birth year] (66 y.o. Waldron Session Primary Care Carmon Brigandi: Karle Plumber Other Clinician: Referring Malkia Nippert: Treating Duante Arocho/Extender: Annice Needy in Treatment: 1 Active Inactive Wound/Skin Impairment Nursing Diagnoses: Impaired tissue integrity Knowledge deficit related to ulceration/compromised skin integrity Goals: Patient will demonstrate a reduced rate of smoking or cessation of smoking Date Initiated: 02/03/2022 Target Resolution Date: 03/26/2022 Goal Status: Active Patient/caregiver will verbalize understanding of skin care regimen Date Initiated: 02/03/2022 Target Resolution Date: 03/26/2022 Goal Status: Active Interventions: Assess patient/caregiver ability to obtain necessary supplies Treatment Activities: Skin care regimen initiated : 02/03/2022 Topical wound management initiated : 02/03/2022 Notes: Electronic Signature(s) Signed: 02/12/2022 7:35:53 AM By: Blanche East RN Entered By: Blanche East on 02/11/2022 15:19:46 -------------------------------------------------------------------------------- Pain Assessment Details Patient Name: Date of Service: Brad [name], RO NA LD 02/11/2022 3:00 PM Medical Record Number: [id number] Patient Account Number: [id number] Date of Birth/Sex: Treating RN: 1955-12-29 (66 y.o. Waldron Session Primary Care Cyla Haluska: Karle Plumber Other Clinician: Referring Shafin Pollio: Treating Glee Lashomb/Extender: Annice Needy in Treatment: 1 Active Problems Location of Pain Severity and Description of Pain Patient Has Paino Yes Site Locations Pain Location: Generalized Pain Rate the  pain. Current Pain Level: 5 Pain Management and Medication Current Pain Management: Electronic Signature(s) Signed: 02/12/2022 7:35:53 AM By: Blanche East RN Entered By: Blanche East on 02/11/2022 15:12:10 -------------------------------------------------------------------------------- Patient/Caregiver Education Details Patient Name: Date of Service: Brad [name], [place] NA LD 8/24/2023andnbsp3:00 PM Medical Record Number: [id number] Patient Account Number: [id number] Date of Birth/Gender: Treating RN: 1956/04/16 (66 y.o. Waldron Session Primary Care Physician: Karle Plumber Other Clinician: Referring Physician: Treating Physician/Extender: Annice Needy in Treatment: 1 Education Assessment Education Provided To: Patient Education Topics Provided Smoking and Wound Healing: Methods: Explain/Verbal Responses: Reinforcements needed, State content correctly Wound/Skin Impairment: Methods: Explain/Verbal Responses: Reinforcements needed, State content correctly Electronic Signature(s) Signed: 02/12/2022 7:35:53 AM By: Blanche East RN Entered By: Blanche East on 02/11/2022 15:20:11 -------------------------------------------------------------------------------- Wound Assessment Details Patient Name: Date of Service: Brad [name], [place] NA LD 02/11/2022 3:00 PM Medical Record Number: [id number] Patient Account Number: [id number] Date of Birth/Sex: Treating RN: Jul 06, 1955 (66 y.o. Waldron Session Primary Care Shiara Mcgough: Karle Plumber Other Clinician: Referring Afua Hoots: Treating Tryston Gilliam/Extender: Annice Needy in Treatment: 1 Wound Status Wound Number: 1 Primary Etiology: Atypical Wound Location: Left, Dorsal Foot Wound Status: Open Wounding Event: Blister Comorbid History: Anemia, Arrhythmia, Hypertension, Gout Date Acquired: 11/19/2021 Weeks Of Treatment: 1 Clustered Wound: No Photos Wound Measurements Length: (cm)  7 Width: (cm) 1.6 Depth: (cm) 0.1 Area: (cm) 8.796 Volume: (cm) 0.88 % Reduction in  Area: 44.7% % Reduction in Volume: 44.7% Epithelialization: Small (1-33%) Tunneling: No Undermining: No Wound Description Classification: Full Thickness Without Exposed Support Structures Wound Margin: Distinct, outline attached Exudate Amount: Medium Exudate Type: Serosanguineous Exudate Color: red, brown Foul Odor After Cleansing: No Slough/Fibrino Yes Wound Bed Granulation Amount: Large (67-100%) Exposed Structure Granulation Quality: Red Fascia Exposed: No Necrotic Amount: Small (1-33%) Fat Layer (Subcutaneous Tissue) Exposed: Yes Necrotic Quality: Adherent Slough Tendon Exposed: No Muscle Exposed: No Joint Exposed: No Bone Exposed: No Treatment Notes Wound #1 (Foot) Wound Laterality: Dorsal, Left Cleanser Soap and Water Discharge Instruction: May shower and wash wound with dial antibacterial soap and water prior to dressing change. Wound Cleanser Discharge Instruction: Cleanse the wound with wound cleanser prior to applying a clean dressing using gauze sponges, not tissue or cotton balls. Peri-Wound Care Topical Primary Dressing SILVERCEL Alginate Dressing, 4x8 (in/in) Secondary Dressing Woven Gauze Sponge, Non-Sterile 4x4 in Discharge Instruction: Apply over primary dressing as directed. Secured With Elastic Bandage 4 inch (ACE bandage) Discharge Instruction: Secure with ACE bandage as directed. Kerlix Roll Sterile, 4.5x3.1 (in/yd) Discharge Instruction: Secure with Kerlix as directed. 17M Medipore Soft Cloth Surgical T 2x10 (in/yd) ape Discharge Instruction: Secure with tape as directed. Compression Wrap Compression Stockings Add-Ons Electronic Signature(s) Signed: 02/12/2022 7:35:53 AM By: Blanche East RN Entered By: Blanche East on 02/11/2022 15:19:19 -------------------------------------------------------------------------------- Vitals Details Patient Name: Date  of Service: Brad [name], [place] NA LD 02/11/2022 3:00 PM Medical Record Number: [id number] Patient Account Number: [id number] Date of Birth/Sex: Treating RN: [birth year] (66 y.o. Waldron Session Primary Care Alante Tolan: Other Clinician: Karle Plumber Referring Kendyl Festa: Treating Alyx Mcguirk/Extender: Annice Needy in Treatment: 1 Vital Signs Time Taken: 15:11 Temperature (F): 98.4 Height (in): 72 Pulse (bpm): 86 Weight (lbs): 152 Respiratory Rate (breaths/min): 18 Body Mass Index (BMI): 20.6 Blood Pressure (mmHg): 134/78 Reference Range: 80 - 120 mg / dl Electronic Signature(s) Signed: 02/12/2022 7:35:53 AM By: Blanche East RN Entered By: Blanche East on 02/11/2022 15:11:56

## 2022-02-16 ENCOUNTER — Telehealth: Payer: Self-pay

## 2022-02-16 DIAGNOSIS — M1A9XX1 Chronic gout, unspecified, with tophus (tophi): Secondary | ICD-10-CM

## 2022-02-16 MED ORDER — PREDNISONE 10 MG PO TABS: 10.0000 mg | ORAL_TABLET | Freq: Every day | ORAL | 1 refills | Status: DC

## 2022-02-16 MED ORDER — OXYCODONE-ACETAMINOPHEN 5-325 MG PO TABS: 1.0000 | ORAL_TABLET | Freq: Four times a day (QID) | ORAL | 0 refills | Status: AC | PRN

## 2022-02-16 NOTE — Telephone Encounter (Signed)
Next Visit: PLEASE ADVISE FOLLOW-UP  Last Visit: 11/27/2021  Last Fill: 10/30/2021  Dx: Chronic tophaceous gout   Current Dose per office note on 11/27/2021: prednisone 10 mg daily  Okay to refill Prednisone?

## 2022-02-16 NOTE — Telephone Encounter (Signed)
Advised patient that [name] checked on the database and looks like Rx was actually written for 20 tablets and only 10 dispensed. [name] sent new Rx for the 10 tablets to [place]. Patient expressed verbal understanding of labs.

## 2022-02-16 NOTE — Telephone Encounter (Signed)
Unable to leave message to inform patient that [name] has sent in a Rx today.

## 2022-02-16 NOTE — Telephone Encounter (Signed)
I checked on the database and looks like Rx was actually written for 20 tablets and only 10 dispensed. I sent new Rx for the 10 tablets to [facility].

## 2022-02-19 ENCOUNTER — Encounter: Payer: Self-pay | Admitting: Hematology and Oncology

## 2022-02-19 ENCOUNTER — Encounter (HOSPITAL_BASED_OUTPATIENT_CLINIC_OR_DEPARTMENT_OTHER): Payer: Self-pay | Attending: General Surgery | Admitting: General Surgery

## 2022-02-19 DIAGNOSIS — I129 Hypertensive chronic kidney disease with stage 1 through stage 4 chronic kidney disease, or unspecified chronic kidney disease: Secondary | ICD-10-CM | POA: Insufficient documentation

## 2022-02-19 DIAGNOSIS — L97522 Non-pressure chronic ulcer of other part of left foot with fat layer exposed: Secondary | ICD-10-CM | POA: Diagnosis present

## 2022-02-19 DIAGNOSIS — M1A9XX1 Chronic gout, unspecified, with tophus (tophi): Secondary | ICD-10-CM | POA: Diagnosis not present

## 2022-02-19 DIAGNOSIS — N184 Chronic kidney disease, stage 4 (severe): Secondary | ICD-10-CM | POA: Insufficient documentation

## 2022-02-19 DIAGNOSIS — I480 Paroxysmal atrial fibrillation: Secondary | ICD-10-CM | POA: Insufficient documentation

## 2022-02-19 DIAGNOSIS — E43 Unspecified severe protein-calorie malnutrition: Secondary | ICD-10-CM | POA: Insufficient documentation

## 2022-02-23 NOTE — Progress Notes (Signed)
Brad, [name] ([id number]) Visit Report for 02/19/2022 Chief Complaint Document Details Patient Name: Date of Service: Brad [name], [place] LD 02/19/2022 1:45 PM Medical Record Number: [id number] Patient Account Number: [id number] Date of Birth/Sex: Treating RN: [birth year] (66 y.o. Brad [name] Primary Care Provider: Karle [name] Other Clinician: Referring Provider: Treating Provider/Extender: Brad [name] in Treatment: 2 Information Obtained from: Patient Chief Complaint Patient seen for complaints of Non-Healing Wound on his dorsal left foot. Electronic Signature(s) Signed: 02/19/2022 2:17:00 PM By: Brad Maudlin MD FACS Entered By: Brad [name] on 02/19/2022 14:16:59 -------------------------------------------------------------------------------- Debridement Details Patient Name: Date of Service: RO Brad [name], RO NA LD 02/19/2022 1:45 PM Medical Record Number: [id number] Patient Account Number: [id number] Date of Birth/Sex: Treating RN: 29-Dec-1955 (66 y.o. Brad [name] Primary Care Provider: Karle [name] Other Clinician: Referring Provider: Treating Provider/Extender: Brad [name] in Treatment: 2 Debridement Performed for Assessment: Wound #1 Left,Dorsal Foot Performed By: Physician Brad Maudlin, MD Debridement Type: Debridement Level of Consciousness (Pre-procedure): Awake and Alert Pre-procedure Verification/Time Out Yes - 14:02 Taken: Start Time: 14:03 Pain Control: Lidocaine 5% topical ointment T Area Debrided (L x W): otal 6 (cm) x 1.5 (cm) = 9 (cm) Tissue and other material debrided: Non-Viable, Eschar, Slough, Slough Level: Non-Viable Tissue Debridement Description: Selective/Open Wound Instrument: Curette Bleeding: Minimum Hemostasis Achieved: Pressure Procedural Pain: 0 Post Procedural Pain: 0 Response to Treatment: Procedure was tolerated well Level of Consciousness (Post- Awake and  Alert procedure): Post Debridement Measurements of Total Wound Length: (cm) 6 Width: (cm) 1.5 Depth: (cm) 0.1 Volume: (cm) 0.707 Character of Wound/Ulcer Post Debridement: Improved Post Procedure Diagnosis Same as Pre-procedure Electronic Signature(s) Signed: 02/19/2022 4:29:30 PM By: Brad Maudlin MD FACS Signed: 02/23/2022 12:19:15 PM By: Brad East RN Entered By: Brad [name] on 02/19/2022 14:07:06 -------------------------------------------------------------------------------- HPI Details Patient Name: Date of Service: RO Brad [name], RO NA LD 02/19/2022 1:45 PM Medical Record Number: [id number] Patient Account Number: [id number] Date of Birth/Sex: Treating RN: May 22, 1956 (66 y.o. Brad [name] Primary Care Provider: Karle [name] Other Clinician: Referring Provider: Treating Provider/Extender: Brad [name] in Treatment: 2 History of Present Illness HPI Description: ADMISSION 02/03/2022 This is a 66 year old man with a past medical history significant for stage IV chronic kidney disease, chronic tophaceous gout, and essential hypertension. He was admitted to the hospital in June with a severe gout flare. His left leg became tremendously swollen. Ultimately, after discharge, the skin on the top of his left foot broke down. He has treated this variously with Epsom salt soaks and peroxide. He was also prescribed mupirocin at 1 point. As his wound is not healing, he was referred to the wound care center for further evaluation and management. He is not diabetic. ABI in clinic today was 1.03. On the dorsolateral surface of his left foot, there is a large wound. There is evidence of perimeter epithelialization. Eschar and slough are present on the wound surface. There is good granulation tissue forming. No concern for infection. 02/11/2022: The wound has contracted considerably even in just 1 week. The surface is healthy with good granulation tissue. There is  nice perimeter epithelialization. 02/19/2022: The wound continues to contract very nicely. There is perimeter epithelialization with just a little bit of eschar around the edges and light slough on the surface. There is robust granulation tissue present. Electronic Signature(s) Signed: 02/19/2022 2:17:31 PM By: Brad Maudlin MD FACS Entered By: Brad [name] on 02/19/2022 14:17:31 -------------------------------------------------------------------------------- Physical Exam Details Patient Name: Date of Service: RO  Brad [name], RO NA LD 02/19/2022 1:45 PM Medical Record Number: [id number] Patient Account Number: [id number] Date of Birth/Sex: Treating RN: 12/23/1955 (66 y.o. Brad [name] Primary Care Provider: Karle [name] Other Clinician: Referring Provider: Treating Provider/Extender: Brad [name] in Treatment: 2 Constitutional Slightly hypertensive. . . . No acute distress.Marland Kitchen Respiratory Normal work of breathing on room air.. Notes 02/19/2022: The wound continues to contract very nicely. There is perimeter epithelialization with just a little bit of eschar around the edges and light slough on the surface. There is robust granulation tissue present. Electronic Signature(s) Signed: 02/19/2022 2:18:34 PM By: Brad Maudlin MD FACS Entered By: Brad [name] on 02/19/2022 14:18:34 -------------------------------------------------------------------------------- Physician Orders Details Patient Name: Date of Service: RO Brad [name], RO NA LD 02/19/2022 1:45 PM Medical Record Number: [id number] Patient Account Number: [id number] Date of Birth/Sex: Treating RN: 08-17-1955 (66 y.o. Brad [name] Primary Care Provider: Karle [name] Other Clinician: Referring Provider: Treating Provider/Extender: Brad [name] in Treatment: 2 Verbal / Phone Orders: No Diagnosis Coding ICD-10 Coding Code Description [phone] Non-pressure chronic  ulcer of other part of left foot with fat layer exposed N18.4 Chronic kidney disease, stage 4 (severe) M1A.[id number] Chronic gout, unspecified, with tophus (tophi) R60.0 Localized edema E43 Unspecified severe protein-calorie malnutrition I10 Essential (primary) hypertension Follow-up Appointments ppointment in 1 week. - Dr. Celine [name] - room 4 Return A Monday 03/01/2022 at 2:30 PM Anesthetic Wound #1 Left,Dorsal Foot (In clinic) Topical Lidocaine 5% applied to wound bed - prior to debridement Wound Treatment Wound #1 - Foot Wound Laterality: Dorsal, Left Cleanser: Soap and Water Every Other Day/30 Days Discharge Instructions: May shower and wash wound with dial antibacterial soap and water prior to dressing change. Cleanser: Wound Cleanser Every Other Day/30 Days Discharge Instructions: Cleanse the wound with wound cleanser prior to applying a clean dressing using gauze sponges, not tissue or cotton balls. Prim Dressing: SILVERCEL Alginate Dressing, 4x8 (in/in) (Generic) Every Other Day/30 Days ary Secondary Dressing: Woven Gauze Sponge, Non-Sterile 4x4 in (Generic) Every Other Day/30 Days Discharge Instructions: Apply over primary dressing as directed. Secured With: Elastic Bandage 4 inch (ACE bandage) (Generic) Every Other Day/30 Days Discharge Instructions: Secure with ACE bandage as directed. Secured With: [employer], 4.5x3.1 (in/yd) (Generic) Every Other Day/30 Days Discharge Instructions: Secure with Kerlix as directed. Secured With: 104M Medipore Public affairs consultant Surgical T 2x10 (in/yd) (Generic) Every Other Day/30 Days ape Discharge Instructions: Secure with tape as directed. Electronic Signature(s) Signed: 02/19/2022 4:29:30 PM By: Brad Maudlin MD FACS Signed: 02/23/2022 12:19:15 PM By: Brad East RN Previous Signature: 02/19/2022 2:18:48 PM Version By: Brad Maudlin MD FACS Entered By: Brad [name] on 02/19/2022  16:04:40 -------------------------------------------------------------------------------- Problem List Details Patient Name: Date of Service: RO Brad [name], RO NA LD 02/19/2022 1:45 PM Medical Record Number: [id number] Patient Account Number: [id number] Date of Birth/Sex: Treating RN: 04/03/1956 (66 y.o. Brad [name] Primary Care Provider: Karle [name] Other Clinician: Referring Provider: Treating Provider/Extender: Brad [name] in Treatment: 2 Active Problems ICD-10 Encounter Code Description Active Date MDM Diagnosis [phone] Non-pressure chronic ulcer of other part of left foot with fat layer exposed 02/03/2022 No Yes N18.4 Chronic kidney disease, stage 4 (severe) 02/03/2022 No Yes M1A.9XX1 Chronic gout, unspecified, with tophus (tophi) 02/03/2022 No Yes R60.0 Localized edema 02/03/2022 No Yes E43 Unspecified severe protein-calorie malnutrition 02/03/2022 No Yes I10 Essential (primary) hypertension 02/03/2022 No Yes Inactive Problems Resolved Problems Electronic Signature(s) Signed: 02/19/2022 4:29:30 PM By: Brad Maudlin MD FACS Signed: 02/23/2022 12:19:15 PM  By: Brad East RN Previous Signature: 02/19/2022 2:16:49 PM Version By: Brad Maudlin MD FACS Entered By: Brad [name] on 02/19/2022 14:18:33 -------------------------------------------------------------------------------- Progress Note Details Patient Name: Date of Service: RO Brad [name], RO NA LD 02/19/2022 1:45 PM Medical Record Number: [id number] Patient Account Number: [id number] Date of Birth/Sex: Treating RN: 1956/01/25 (66 y.o. Brad [name] Primary Care Provider: Karle [name] Other Clinician: Referring Provider: Treating Provider/Extender: Brad [name] in Treatment: 2 Subjective Chief Complaint Information obtained from Patient Patient seen for complaints of Non-Healing Wound on his dorsal left foot. History of Present Illness  (HPI) ADMISSION 02/03/2022 This is a 66 year old man with a past medical history significant for stage IV chronic kidney disease, chronic tophaceous gout, and essential hypertension. He was admitted to the hospital in June with a severe gout flare. His left leg became tremendously swollen. Ultimately, after discharge, the skin on the top of his left foot broke down. He has treated this variously with Epsom salt soaks and peroxide. He was also prescribed mupirocin at 1 point. As his wound is not healing, he was referred to the wound care center for further evaluation and management. He is not diabetic. ABI in clinic today was 1.03. On the dorsolateral surface of his left foot, there is a large wound. There is evidence of perimeter epithelialization. Eschar and slough are present on the wound surface. There is good granulation tissue forming. No concern for infection. 02/11/2022: The wound has contracted considerably even in just 1 week. The surface is healthy with good granulation tissue. There is nice perimeter epithelialization. 02/19/2022: The wound continues to contract very nicely. There is perimeter epithelialization with just a little bit of eschar around the edges and light slough on the surface. There is robust granulation tissue present. Patient History Information obtained from Patient. Family History Diabetes - Mother, Heart Disease - Mother, Hypertension - Mother, Kidney Disease - Siblings, No family history of Cancer, Hereditary Spherocytosis, Lung Disease, Seizures, Stroke, Thyroid Problems, Tuberculosis. Social History Current some day smoker - "5-6 in 3 days", Marital Status - Divorced, Alcohol Use - Rarely, Drug Use - No History, Caffeine Use - Daily. Medical History Hematologic/Lymphatic Patient has history of Anemia Cardiovascular Patient has history of Arrhythmia - paroxysmal atrial fibrillation, Hypertension Musculoskeletal Patient has history of Gout Medical A Surgical  History Notes nd Hematologic/Lymphatic vitamin B12 deficiency, iron deficiency Genitourinary CKD stage IV Objective Constitutional Slightly hypertensive. No acute distress.. Vitals Time Taken: 1:50 PM, Height: 72 in, Weight: 152 lbs, BMI: 20.6, Temperature: 98.5 F, Pulse: 81 bpm, Respiratory Rate: 18 breaths/min, Blood Pressure: 149/88 mmHg. Respiratory Normal work of breathing on room air.. General Notes: 02/19/2022: The wound continues to contract very nicely. There is perimeter epithelialization with just a little bit of eschar around the edges and light slough on the surface. There is robust granulation tissue present. Integumentary (Hair, Skin) Wound #1 status is Open. Original cause of wound was Blister. The date acquired was: 11/19/2021. The wound has been in treatment 2 weeks. The wound is located on the Left,Dorsal Foot. The wound measures 6cm length x 1.5cm width x 0.1cm depth; 7.069cm^2 area and 0.707cm^3 volume. There is Fat Layer (Subcutaneous Tissue) exposed. There is no tunneling or undermining noted. There is a medium amount of serosanguineous drainage noted. The wound margin is distinct with the outline attached to the wound base. There is large (67-100%) red, pink granulation within the wound bed. There is a small (1-33%) amount of necrotic tissue within the wound bed including  Eschar. Assessment Active Problems ICD-10 Non-pressure chronic ulcer of other part of left foot with fat layer exposed Chronic kidney disease, stage 4 (severe) Chronic gout, unspecified, with tophus (tophi) Localized edema Unspecified severe protein-calorie malnutrition Essential (primary) hypertension Procedures Wound #1 Pre-procedure diagnosis of Wound #1 is an Atypical located on the Left,Dorsal Foot . There was a Selective/Open Wound Non-Viable Tissue Debridement with a total area of 9 sq cm performed by Brad Maudlin, MD. With the following instrument(s): Curette to remove Non-Viable  tissue/material. Material removed includes Eschar and Slough and after achieving pain control using Lidocaine 5% topical ointment. No specimens were taken. A time out was conducted at 14:02, prior to the start of the procedure. A Minimum amount of bleeding was controlled with Pressure. The procedure was tolerated well with a pain level of 0 throughout and a pain level of 0 following the procedure. Post Debridement Measurements: 6cm length x 1.5cm width x 0.1cm depth; 0.707cm^3 volume. Character of Wound/Ulcer Post Debridement is improved. Post procedure Diagnosis Wound #1: Same as Pre-Procedure Plan Follow-up Appointments: Return Appointment in 1 week. - Dr. Celine [name] - room 4 Monday 03/01/2022 at 2:30 PM WOUND #1: - Foot Wound Laterality: Dorsal, Left Cleanser: Soap and Water Every Other Day/30 Days Discharge Instructions: May shower and wash wound with dial antibacterial soap and water prior to dressing change. Cleanser: Wound Cleanser Every Other Day/30 Days Discharge Instructions: Cleanse the wound with wound cleanser prior to applying a clean dressing using gauze sponges, not tissue or cotton balls. Prim Dressing: SILVERCEL Alginate Dressing, 4x8 (in/in) (Generic) Every Other Day/30 Days ary Secondary Dressing: Woven Gauze Sponge, Non-Sterile 4x4 in (Generic) Every Other Day/30 Days Discharge Instructions: Apply over primary dressing as directed. Secured With: Elastic Bandage 4 inch (ACE bandage) (Generic) Every Other Day/30 Days Discharge Instructions: Secure with ACE bandage as directed. Secured With: [employer], 4.5x3.1 (in/yd) (Generic) Every Other Day/30 Days Discharge Instructions: Secure with Kerlix as directed. Secured With: 34M Medipore Public affairs consultant Surgical T 2x10 (in/yd) (Generic) Every Other Day/30 Days ape Discharge Instructions: Secure with tape as directed. 02/19/2022: The wound continues to contract very nicely. There is perimeter epithelialization with just a little bit  of eschar around the edges and light slough on the surface. There is robust granulation tissue present. I used a curette to debride eschar and slough from his wound. We will continue to use silver alginate with Kerlix and Ace bandaging. Follow-up in 1 week. Electronic Signature(s) Signed: 02/19/2022 2:19:11 PM By: Brad Maudlin MD FACS Entered By: Brad [name] on 02/19/2022 14:19:10 -------------------------------------------------------------------------------- HxROS Details Patient Name: Date of Service: RO Brad [name], RO NA LD 02/19/2022 1:45 PM Medical Record Number: [id number] Patient Account Number: [id number] Date of Birth/Sex: Treating RN: 1956-06-16 (66 y.o. Brad [name] Primary Care Provider: Karle [name] Other Clinician: Referring Provider: Treating Provider/Extender: Brad [name] in Treatment: 2 Information Obtained From Patient Hematologic/Lymphatic Medical History: Positive for: Anemia Past Medical History Notes: vitamin B12 deficiency, iron deficiency Cardiovascular Medical History: Positive for: Arrhythmia - paroxysmal atrial fibrillation; Hypertension Genitourinary Medical History: Past Medical History Notes: CKD stage IV Musculoskeletal Medical History: Positive for: Gout Immunizations Pneumococcal Vaccine: Received Pneumococcal Vaccination: No Implantable Devices None Family and Social History Cancer: No; Diabetes: Yes - Mother; Heart Disease: Yes - Mother; Hereditary Spherocytosis: No; Hypertension: Yes - Mother; Kidney Disease: Yes - Siblings; Lung Disease: No; Seizures: No; Stroke: No; Thyroid Problems: No; Tuberculosis: No; Current some day smoker - "5-6 in 3 days"; Marital Status - Divorced;  Alcohol Use: Rarely; Drug Use: No History; Caffeine Use: Daily; Financial Concerns: No; Food, Clothing or Shelter Needs: No; Support System Lacking: No; Transportation Concerns: No Engineer, maintenance) Signed: 02/19/2022  4:29:30 PM By: Brad Maudlin MD FACS Signed: 02/23/2022 12:19:15 PM By: Brad East RN Entered By: Brad [name] on 02/19/2022 14:18:13 -------------------------------------------------------------------------------- SuperBill Details Patient Name: Date of Service: Brad [name], RO NA LD 02/19/2022 Medical Record Number: [id number] Patient Account Number: [id number] Date of Birth/Sex: Treating RN: September 17, 1955 (66 y.o. Brad [name] Primary Care Provider: Karle [name] Other Clinician: Referring Provider: Treating Provider/Extender: Brad [name] in Treatment: 2 Diagnosis Coding ICD-10 Codes Code Description [phone] Non-pressure chronic ulcer of other part of left foot with fat layer exposed N18.4 Chronic kidney disease, stage 4 (severe) M1A.9XX1 Chronic gout, unspecified, with tophus (tophi) R60.0 Localized edema E43 Unspecified severe protein-calorie malnutrition I10 Essential (primary) hypertension Facility Procedures Physician Procedures : CPT4 Code Description Modifier [id number] - WC PHYS LEVEL 3 - EST PT 25 ICD-10 Diagnosis Description L97.522 Non-pressure chronic ulcer of other part of left foot with fat layer exposed R60.0 Localized edema N18.4 Chronic kidney disease, stage 4  (severe) E43 Unspecified severe protein-calorie malnutrition Quantity: 1 : [id number] - WC PHYS DEBR WO ANESTH 20 SQ CM ICD-10 Diagnosis Description L97.522 Non-pressure chronic ulcer of other part of left foot with fat layer exposed Quantity: 1 Electronic Signature(s) Signed: 02/19/2022 4:29:30 PM By: Brad Maudlin MD FACS Signed: 02/23/2022 12:19:15 PM By: Brad East RN Previous Signature: 02/19/2022 2:19:31 PM Version By: Brad Maudlin MD FACS Entered By: Brad [name] on 02/19/2022 14:19:40

## 2022-02-23 NOTE — Progress Notes (Signed)
DAYSHAUN, WHOBREY ([id number]) Visit Report for 02/19/2022 Arrival Information Details Patient Name: Date of Service: Dayton Martes, [place] LD 02/19/2022 1:45 PM Medical Record Number: [id number] Patient Account Number: [id number] Date of Birth/Sex: Treating RN: [birth year] (66 y.o. Waldron Session Primary Care Darvis Croft: Karle Plumber Other Clinician: Referring Lus Kriegel: Treating Radwan Cowley/Extender: Annice Needy in Treatment: 2 Visit Information History Since Last Visit All ordered tests and consults were completed: Yes Patient Arrived: Ambulatory Added or deleted any medications: No Arrival Time: 13:54 Any new allergies or adverse reactions: No Accompanied By: self Had a fall or experienced change in No Transfer Assistance: None activities of daily living that may affect Patient Identification Verified: Yes risk of falls: Secondary Verification Process Completed: Yes Signs or symptoms of abuse/neglect since last visito No Patient Requires Transmission-Based Precautions: No Hospitalized since last visit: No Patient Has Alerts: Yes Implantable device outside of the clinic excluding No Patient Alerts: Patient on Blood Thinner cellular tissue based products placed in the center since last visit: Has Dressing in Place as Prescribed: Yes Pain Present Now: No Electronic Signature(s) Signed: 02/23/2022 12:19:15 PM By: Blanche East RN Entered By: Blanche East on 02/19/2022 14:26:46 -------------------------------------------------------------------------------- Encounter Discharge Information Details Patient Name: Date of Service: RO Clent Demark, RO NA LD 02/19/2022 1:45 PM Medical Record Number: [id number] Patient Account Number: [id number] Date of Birth/Sex: Treating RN: 03/17/1956 (66 y.o. Waldron Session Primary Care Porschea Borys: Karle Plumber Other Clinician: Referring Ceniya Fowers: Treating Majour Frei/Extender: Annice Needy in Treatment:  2 Encounter Discharge Information Items Post Procedure Vitals Discharge Condition: Stable Temperature (F): 98.5 Ambulatory Status: Ambulatory Pulse (bpm): 81 Discharge Destination: Home Respiratory Rate (breaths/min): 18 Transportation: Private Auto Blood Pressure (mmHg): 149/88 Accompanied By: self Schedule Follow-up Appointment: Yes Clinical Summary of Care: Electronic Signature(s) Signed: 02/23/2022 12:19:15 PM By: Blanche East RN Entered By: Blanche East on 02/19/2022 14:19:22 -------------------------------------------------------------------------------- Lower Extremity Assessment Details Patient Name: Date of Service: RO Clent Demark, [place] NA LD 02/19/2022 1:45 PM Medical Record Number: [id number] Patient Account Number: [id number] Date of Birth/Sex: Treating RN: 05/24/1956 (66 y.o. Waldron Session Primary Care Eliyana Pagliaro: Karle Plumber Other Clinician: Referring Jonty Morrical: Treating Jacqueleen Pulver/Extender: Annice Needy in Treatment: 2 Edema Assessment Assessed: [Left: No] [Right: No] E[Left: dema] [Right: :] Calf Left: Right: Point of Measurement: From Medial Instep 33 cm Ankle Left: Right: Point of Measurement: From Medial Instep 21.5 cm Vascular Assessment Pulses: Dorsalis Pedis Palpable: [Left:Yes] Electronic Signature(s) Signed: 02/23/2022 12:19:15 PM By: Blanche East RN Entered By: Blanche East on 02/19/2022 13:56:22 -------------------------------------------------------------------------------- Multi Wound Chart Details Patient Name: Date of Service: Dayton Martes, RO NA LD 02/19/2022 1:45 PM Medical Record Number: [id number] Patient Account Number: [id number] Date of Birth/Sex: Treating RN: Mar 26, 1956 (66 y.o. Waldron Session Primary Care Yoandri Congrove: Karle Plumber Other Clinician: Referring Tamirah George: Treating Patrice Matthew/Extender: Annice Needy in Treatment: 2 Vital Signs Height(in): 72 Pulse(bpm):  81 Weight(lbs): 152 Blood Pressure(mmHg): 149/88 Body Mass Index(BMI): 20.6 Temperature(F): 98.5 Respiratory Rate(breaths/min): 18 Photos: [N/A:N/A] Left, Dorsal Foot N/A N/A Wound Location: Blister N/A N/A Wounding Event: Atypical N/A N/A Primary Etiology: Anemia, Arrhythmia, Hypertension, N/A N/A Comorbid History: Gout 11/19/2021 N/A N/A Date Acquired: 2 N/A N/A Weeks of Treatment: Open N/A N/A Wound Status: No N/A N/A Wound Recurrence: 6x1.5x0.1 N/A N/A Measurements L x W x D (cm) 7.069 N/A N/A A (cm) : rea 0.707 N/A N/A Volume (cm) : 55.60% N/A N/A % Reduction in A rea: 55.50% N/A N/A % Reduction in Volume: Full Thickness Without  Exposed N/A N/A Classification: Support Structures Medium N/A N/A Exudate A mount: Serosanguineous N/A N/A Exudate Type: red, brown N/A N/A Exudate Color: Distinct, outline attached N/A N/A Wound Margin: Large (67-100%) N/A N/A Granulation A mount: Red, Pink N/A N/A Granulation Quality: Small (1-33%) N/A N/A Necrotic A mount: Eschar N/A N/A Necrotic Tissue: Fat Layer (Subcutaneous Tissue): Yes N/A N/A Exposed Structures: Fascia: No Tendon: No Muscle: No Joint: No Bone: No Small (1-33%) N/A N/A Epithelialization: Debridement - Selective/Open Wound N/A N/A Debridement: Pre-procedure Verification/Time Out 14:02 N/A N/A Taken: Lidocaine 5% topical ointment N/A N/A Pain Control: Necrotic/Eschar, Slough N/A N/A Tissue Debrided: Non-Viable Tissue N/A N/A Level: 9 N/A N/A Debridement A (sq cm): rea Curette N/A N/A Instrument: Minimum N/A N/A Bleeding: Pressure N/A N/A Hemostasis A chieved: 0 N/A N/A Procedural Pain: 0 N/A N/A Post Procedural Pain: Procedure was tolerated well N/A N/A Debridement Treatment Response: 6x1.5x0.1 N/A N/A Post Debridement Measurements L x W x D (cm) 0.707 N/A N/A Post Debridement Volume: (cm) Debridement N/A N/A Procedures Performed: Treatment Notes Electronic  Signature(s) Signed: 02/19/2022 2:16:54 PM By: Fredirick Maudlin MD FACS Signed: 02/23/2022 12:19:15 PM By: Blanche East RN Entered By: Fredirick Maudlin on 02/19/2022 14:16:54 -------------------------------------------------------------------------------- Multi-Disciplinary Care Plan Details Patient Name: Date of Service: Dayton Martes, RO NA LD 02/19/2022 1:45 PM Medical Record Number: [id number] Patient Account Number: [id number] Date of Birth/Sex: Treating RN: [birth year] (66 y.o. Waldron Session Primary Care Birtie Fellman: Karle Plumber Other Clinician: Referring Savannaha Stonerock: Treating Doranne Schmutz/Extender: Annice Needy in Treatment: 2 Active Inactive Wound/Skin Impairment Nursing Diagnoses: Impaired tissue integrity Knowledge deficit related to ulceration/compromised skin integrity Goals: Patient will demonstrate a reduced rate of smoking or cessation of smoking Date Initiated: 02/03/2022 Target Resolution Date: 03/26/2022 Goal Status: Active Patient/caregiver will verbalize understanding of skin care regimen Date Initiated: 02/03/2022 Target Resolution Date: 03/26/2022 Goal Status: Active Interventions: Assess patient/caregiver ability to obtain necessary supplies Treatment Activities: Skin care regimen initiated : 02/03/2022 Topical wound management initiated : 02/03/2022 Notes: Electronic Signature(s) Signed: 02/23/2022 12:19:15 PM By: Blanche East RN Entered By: Blanche East on 02/19/2022 13:57:38 -------------------------------------------------------------------------------- Pain Assessment Details Patient Name: Date of Service: Dayton Martes, RO NA LD 02/19/2022 1:45 PM Medical Record Number: [id number] Patient Account Number: [id number] Date of Birth/Sex: Treating RN: [birth year] (66 y.o. Waldron Session Primary Care Agam Davenport: Karle Plumber Other Clinician: Referring Stephie Xu: Treating Gunther Zawadzki/Extender: Annice Needy in  Treatment: 2 Active Problems Location of Pain Severity and Description of Pain Patient Has Paino No Site Locations Pain Management and Medication Current Pain Management: Electronic Signature(s) Signed: 02/23/2022 12:19:15 PM By: Blanche East RN Entered By: Blanche East on 02/19/2022 13:55:43 -------------------------------------------------------------------------------- Patient/Caregiver Education Details Patient Name: Date of Service: Dayton Martes, RO NA LD 9/1/2023andnbsp1:45 PM Medical Record Number: [id number] Patient Account Number: [id number] Date of Birth/Gender: Treating RN: 30-Jun-1955 (66 y.o. Waldron Session Primary Care Physician: Karle Plumber Other Clinician: Referring Physician: Treating Physician/Extender: Annice Needy in Treatment: 2 Education Assessment Education Provided To: Patient Education Topics Provided Smoking and Wound Healing: Methods: Explain/Verbal Responses: Reinforcements needed, State content correctly Wound/Skin Impairment: Methods: Explain/Verbal Responses: Reinforcements needed, State content correctly Electronic Signature(s) Signed: 02/23/2022 12:19:15 PM By: Blanche East RN Entered By: Blanche East on 02/19/2022 14:27:30 -------------------------------------------------------------------------------- Wound Assessment Details Patient Name: Date of Service: Dayton Martes, RO NA LD 02/19/2022 1:45 PM Medical Record Number: [id number] Patient Account Number: [id number] Date of Birth/Sex: Treating RN: [birth year] (66 y.o. Waldron Session Primary Care Jazara Swiney: Karle Plumber Other  Clinician: Referring Lorena Clearman: Treating Adiya Selmer/Extender: Annice Needy in Treatment: 2 Wound Status Wound Number: 1 Primary Etiology: Atypical Wound Location: Left, Dorsal Foot Wound Status: Open Wounding Event: Blister Comorbid History: Anemia, Arrhythmia, Hypertension, Gout Date Acquired:  11/19/2021 Weeks Of Treatment: 2 Clustered Wound: No Photos Wound Measurements Length: (cm) 6 Width: (cm) 1.5 Depth: (cm) 0.1 Area: (cm) 7.069 Volume: (cm) 0.707 % Reduction in Area: 55.6% % Reduction in Volume: 55.5% Epithelialization: Small (1-33%) Tunneling: No Undermining: No Wound Description Classification: Full Thickness Without Exposed Support Structures Wound Margin: Distinct, outline attached Exudate Amount: Medium Exudate Type: Serosanguineous Exudate Color: red, brown Foul Odor After Cleansing: No Slough/Fibrino Yes Wound Bed Granulation Amount: Large (67-100%) Exposed Structure Granulation Quality: Red, Pink Fascia Exposed: No Necrotic Amount: Small (1-33%) Fat Layer (Subcutaneous Tissue) Exposed: Yes Necrotic Quality: Eschar Tendon Exposed: No Muscle Exposed: No Joint Exposed: No Bone Exposed: No Treatment Notes Wound #1 (Foot) Wound Laterality: Dorsal, Left Cleanser Soap and Water Discharge Instruction: May shower and wash wound with dial antibacterial soap and water prior to dressing change. Wound Cleanser Discharge Instruction: Cleanse the wound with wound cleanser prior to applying a clean dressing using gauze sponges, not tissue or cotton balls. Peri-Wound Care Topical Primary Dressing SILVERCEL Alginate Dressing, 4x8 (in/in) Secondary Dressing Woven Gauze Sponge, Non-Sterile 4x4 in Discharge Instruction: Apply over primary dressing as directed. Secured With Elastic Bandage 4 inch (ACE bandage) Discharge Instruction: Secure with ACE bandage as directed. Kerlix Roll Sterile, 4.5x3.1 (in/yd) Discharge Instruction: Secure with Kerlix as directed. 61M Medipore Soft Cloth Surgical T 2x10 (in/yd) ape Discharge Instruction: Secure with tape as directed. Compression Wrap Compression Stockings Add-Ons Electronic Signature(s) Signed: 02/23/2022 12:19:15 PM By: Blanche East RN Entered By: Blanche East on 02/19/2022  14:05:43 -------------------------------------------------------------------------------- Vitals Details Patient Name: Date of Service: RO Clent Demark, RO NA LD 02/19/2022 1:45 PM Medical Record Number: [id number] Patient Account Number: [id number] Date of Birth/Sex: Treating RN: [birth year] (66 y.o. Waldron Session Primary Care [place] Mielke: Karle Plumber Other Clinician: Referring Jerilyn Gillaspie: Treating Faren Florence/Extender: Annice Needy in Treatment: 2 Vital Signs Time Taken: 13:50 Temperature (F): 98.5 Height (in): 72 Pulse (bpm): 81 Weight (lbs): 152 Respiratory Rate (breaths/min): 18 Body Mass Index (BMI): 20.6 Blood Pressure (mmHg): 149/88 Reference Range: 80 - 120 mg / dl Electronic Signature(s) Signed: 02/23/2022 12:19:15 PM By: Blanche East RN Entered By: Blanche East on 02/19/2022 13:55:39

## 2022-03-01 ENCOUNTER — Inpatient Hospital Stay: Payer: No Typology Code available for payment source

## 2022-03-01 ENCOUNTER — Inpatient Hospital Stay
Payer: No Typology Code available for payment source | Attending: Hematology and Oncology | Admitting: Hematology and Oncology

## 2022-03-12 ENCOUNTER — Encounter: Payer: Self-pay | Admitting: Hematology and Oncology

## 2022-03-17 ENCOUNTER — Ambulatory Visit: Payer: No Typology Code available for payment source | Admitting: Physician Assistant

## 2022-03-23 ENCOUNTER — Other Ambulatory Visit: Payer: Self-pay | Admitting: Internal Medicine

## 2022-03-23 DIAGNOSIS — I129 Hypertensive chronic kidney disease with stage 1 through stage 4 chronic kidney disease, or unspecified chronic kidney disease: Secondary | ICD-10-CM

## 2022-03-23 NOTE — Telephone Encounter (Signed)
Requested medication (s) are due for refill today: yes  Requested medication (s) are on the active medication list: yes    Last refill: 11/13/21  #60  2 refills  Future visit scheduled yes  Notes to clinic:Failed due to labs, please review. Pt has appt tomorrow 03/24/22. Thank you.  Requested Prescriptions  Pending Prescriptions Disp Refills   sodium bicarbonate 325 MG tablet [Pharmacy Med Name: SODIUM BICARB 325 MG TABLET] 60 tablet 2    Sig: TAKE 1 TABLET BY MOUTH TWICE A DAY     Endocrinology:  Minerals 2 Failed - 03/23/2022  2:28 AM      Failed - Mg Level in normal range and within 180 days    Magnesium  Date Value Ref Range Status  11/22/2016 1.7 1.7 - 2.4 mg/dL Final         Passed - Valid encounter within last 12 months    Recent Outpatient Visits           2 months ago Ulcer of left foot with fat layer exposed [facility])   [place], [place], [place]   4 months ago Hypertensive kidney disease with stage 4 chronic kidney disease ([place])   [place] Ladell Pier, [place]   1 year ago Establishing care with new doctor, encounter for   [place], [place]       Future Appointments             Tomorrow [facility] within normal limits in the last 6 months    Glucose, Bld  Date Value Ref Range Status  02/07/2022 195 (H) 70 - 99 mg/dL Final    Comment:    Glucose reference range applies only to samples taken after fasting for at least 8 hours.   Calcium  Date Value Ref Range Status  02/07/2022 8.7 (L) 8.9 - 10.3 mg/dL Final   Sodium  Date Value Ref Range Status  02/07/2022 132 (L) 135 - 145 mmol/L Final  01/27/2021 137 134 - 144 mmol/L Final   Potassium  Date Value Ref Range Status  02/07/2022 3.9 3.5 - 5.1 mmol/L Final   Chloride  Date Value Ref Range Status   02/07/2022 106 98 - 111 mmol/L Final   BUN  Date Value Ref Range Status  02/07/2022 24 (H) 8 - 23 mg/dL Final  01/27/2021 26 8 - 27 mg/dL Final   Creatinine  Date Value Ref Range Status  11/30/2021 2.04 (H) 0.61 - 1.24 mg/dL Final   Creat  Date Value Ref Range Status  11/06/2021 3.10 (H) 0.70 - 1.35 mg/dL Final   Creatinine, Ser  Date Value Ref Range Status  02/07/2022 2.11 (H) 0.61 - 1.24 mg/dL Final   CO2  Date Value Ref Range Status  02/07/2022 16 (L) 22 - 32 mmol/L Final   GFR calc Af Amer  Date Value Ref Range Status  11/25/2016 >60 >60 mL/min Final    Comment:    (NOTE) The eGFR has been calculated using the CKD EPI equation. This calculation has not been validated in all clinical situations. eGFR's persistently <60 mL/min signify possible Chronic Kidney Disease.    eGFR  Date Value Ref Range Status  10/30/2021 21 (L) > OR = 60 mL/min/1.61m Final    Comment:  The eGFR is based on the CKD-EPI 2021 equation. To calculate  the new eGFR from a previous Creatinine or Cystatin C result, go to https://www.kidney.org/professionals/ kdoqi/gfr%5Fcalculator   01/27/2021 31 (L) >59 mL/min/1.73 Final   GFR, Estimated  Date Value Ref Range Status  02/07/2022 34 (L) >60 mL/min Final    Comment:    (NOTE) Calculated using the CKD-EPI Creatinine Equation (2021)   11/30/2021 36 (L) >60 mL/min Final    Comment:    (NOTE) Calculated using the CKD-EPI Creatinine Equation (2021)

## 2022-03-24 ENCOUNTER — Ambulatory Visit: Payer: No Typology Code available for payment source | Admitting: Physician Assistant

## 2022-03-29 NOTE — Telephone Encounter (Signed)
Attempted to contact patient to schedule follow-up appointment.  Voicemail full / unable to leave a message

## 2022-03-31 NOTE — Telephone Encounter (Signed)
Attempted to call patient but unable to leave message. Attempted to call son Vicente Males but his voicemail is full as well.

## 2022-04-05 ENCOUNTER — Telehealth: Payer: Self-pay | Admitting: Internal Medicine

## 2022-04-05 NOTE — Telephone Encounter (Signed)
Patient called the office requesting something for the pain he is having with a gout flare. Patient states he cannot sleep. Patient did schedule for tomorrow at 1:00pm with [name].

## 2022-04-06 ENCOUNTER — Ambulatory Visit: Payer: No Typology Code available for payment source | Admitting: Internal Medicine

## 2022-04-06 ENCOUNTER — Telehealth: Payer: Self-pay | Admitting: Internal Medicine

## 2022-04-06 ENCOUNTER — Other Ambulatory Visit: Payer: Self-pay | Admitting: Internal Medicine

## 2022-04-06 DIAGNOSIS — M1A9XX1 Chronic gout, unspecified, with tophus (tophi): Secondary | ICD-10-CM

## 2022-04-06 NOTE — Progress Notes (Deleted)
Office Visit Note  Patient: Brad [name]             Date of Birth: 1955-08-17           MRN: [id number]             PCP: Ladell Pier, MD Referring: Ladell Pier, MD Visit Date: 04/06/2022   Subjective:  No chief complaint on file.   History of Present Illness: Brad [name] is a 66 y.o. male here for follow up for gout on treatment with uloric 40 mg daily and prednisone 10 mg daily with recent flare up. ***   Previous HPI 11/27/21 Brad [name] is a 66 y.o. male here for follow up for chronic tophaceous gout of multiple areas. He was recently hospitalized due to severe edema of his left leg from the knee down with increased pain with any weight bearing. At the hospital CT scan demonstrated large effusion with some gas present consistent for severe inflammation. Knee aspiration yielded 30cc of turbid yellow fluid consistent with tophi and positive for extracellular and intracellular crystals with negative stain and culture. Since the hospital his pain is greatly improved, currently on prednisone taper at 30 mg/day and now on colchicine 0.3 mg. He developed a blistering lesion on the left foot from his edema and keeping dressing on this. He does not have any particular clinic or nursing care for this.   Previous HPI 11/06/2021 Brad [name] is a 67 y.o. male here for follow up for chronic tophaceous gout of multiple areas.  Laboratory tests her last visit showed significant changes with hemoglobin decreased to 7.9 and estimated GFR decreased.  He was seen at the emergency department 4 days ago due to worsening pain and left leg swelling lower extremity ultrasound study negative for blood clot.  Joint pains have been somewhat well controlled on the 10 mg prednisone and use of 5 mg Norco 1-2 times daily.  Left leg remains severely swollen he has been unable to work a meaningful amount of hours due to the symptoms.   Previous HPI 10/30/2021 Brad [name] is a 66 y.o. male here  for follow up for chronic tophaceous gout of multiple areas. He has had almost constant flare ups with joint pain since our last visit he improves while taking prednisone but symptoms return within days. He has new tophaceous deposits including in his right shoulder. He had some drainage form nodules on right middle finger and elbow and treated this with peroxide and has some residual hyperpigmentation in those areas.   Previous HPI 07/22/2021 Brad [name] is a 67 y.o. male here for follow up for his gout due to new exacerbation symptoms started last Thursday with severe pain and swelling in the right knee.  He has been unable to walk unassisted requiring crutches or wheelchair.  We spoke on the phone called in a prescription for steroid taper took a Medrol Dosepak.  He felt the medicine was effective on the first day but symptoms quickly returned to severe pain and swelling on the steroid he has 1 day remaining currently cannot work due to pain.   Previous HPI 06/02/21 Brad [name] is a 66 y.o. 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 Still has mild pain and stiffness. He has not noticed any problem with allopurinol  increase.   Previous HPI 04/28/21 Brad [name] is a 66 y.o. 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 He is currently taking allopurinol 50 mg PO daily after recent start and was prescribed colchicine 0.3 mg daily dose.  He did not start the colchicine due to excessive cost. At the ED evaluation he was prescribed prednisone 30 mg daily taken for the past 5 days and the tramadol as needed. He has felt partial improvement so far, back to about the starting amount of hand swelling when this episode began. His right  wrist is the most painful area.   No Rheumatology ROS completed.   PMFS History:  Patient Active Problem List   Diagnosis Date Noted   Iron deficiency anemia due to chronic blood loss 12/17/2021   Non-pressure chronic ulcer of other part of left foot limited to breakdown of skin ([place]) 11/27/2021   Leg pain 11/19/2021   Edema of left lower extremity 11/18/2021   Protein-calorie malnutrition, severe 11/18/2021   Thrombocytosis 11/13/2021   Leukocytosis 11/13/2021   Hypertensive kidney disease with stage 4 chronic kidney disease ([place]) 11/13/2021   Steroid-induced hyperglycemia 11/13/2021   Paroxysmal atrial fibrillation ([place]) 04/02/2021   Chronic kidney disease, stage IV (severe) ([place]) 04/02/2021   Vitamin B 12 deficiency 01/31/2021   Chronic tophaceous gout 01/27/2021   Tobacco dependence 01/27/2021   Anemia, chronic disease 01/27/2021   Essential hypertension 11/21/2016    Past Medical History:  Diagnosis Date   Gout    Hypertension    Kidney failure     Family History  Problem Relation Age of Onset   Diabetes Mother    No past surgical history on file. Social History   Social History Narrative   ** Merged History Encounter **        There is no immunization history on file for this patient.   Objective: Vital Signs: There were no vitals taken for this visit.   Physical Exam   Musculoskeletal Exam: ***  CDAI Exam: CDAI Score: -- Patient Global: --; Provider Global: -- Swollen: --; Tender: -- Joint Exam 04/06/2022   No joint exam has been documented for this visit   There is currently no information documented on the homunculus. Go to the Rheumatology activity and complete the homunculus joint exam.  Investigation: No additional findings.  Imaging: No results found.  Recent Labs: Lab Results  Component Value Date   WBC 10.8 (H) 02/07/2022   HGB 10.7 (L) 02/07/2022   PLT 441 (H) 02/07/2022   NA 132 (L) 02/07/2022   K 3.9 02/07/2022   CL 106  02/07/2022   CO2 16 (L) 02/07/2022   GLUCOSE 195 (H) 02/07/2022   BUN 24 (H) 02/07/2022   CREATININE 2.11 (H) 02/07/2022   BILITOT 0.4 11/30/2021   ALKPHOS 43 11/30/2021   AST 9 (L) 11/30/2021   ALT 8 11/30/2021   PROT 6.3 (L) 11/30/2021   ALBUMIN 3.4 (L) 11/30/2021   CALCIUM 8.7 (L) 02/07/2022   GFRAA >60 11/25/2016    Speciality Comments: No specialty comments available.  Procedures:  No procedures performed Allergies: Patient has no allergy information on record.   Assessment / Plan:     Visit Diagnoses: No diagnosis found.  ***  Orders: No orders of the defined types were placed in this encounter.  No orders of the defined types were placed in this encounter.    Follow-Up Instructions:  No follow-ups on file.   Collier Salina, MD  Note - This record has been created using [employer].  Chart creation errors have been sought, but may not always  have been located. Such creation errors do not reflect on  the standard of medical care.

## 2022-04-06 NOTE — Telephone Encounter (Signed)
Patient requested Prednisone and pain medication be sent to [facility] at [address]  Patient states his left hand is "extremely" swollen and painful.    Patient is scheduled for 04/21/22 at 2:40 pm

## 2022-04-06 NOTE — Telephone Encounter (Signed)
Brad [name] called the office stating he called his pharmacy and they did not receive a prescription for pain medication for him. Advised Brad [name] again that [name] stated that a prescription may not be sent in until this afternoon. Brad [name] states he needs pain medication sent in today. Brad [name] states [name] always gives him something for pain and he needs it asap.

## 2022-04-06 NOTE — Telephone Encounter (Signed)
Next Visit: 04/21/2022  Last Visit: 11/27/2021  Last Fill: 02/16/2022  Dx: Chronic tophaceous gout   Current Dose per office note on 11/27/2021: prednisone 10 mg daily  Okay to refill prednisone?

## 2022-04-06 NOTE — Telephone Encounter (Signed)
Patient called back and was informed that the prednisone prescription has been sent to [name] and is pending approval. Patient also advised that [facility] has been trying to contact him. Patient stated verbal understanding of message and said he would reach out to [facility].

## 2022-04-07 ENCOUNTER — Other Ambulatory Visit: Payer: Self-pay | Admitting: *Deleted

## 2022-04-07 DIAGNOSIS — N184 Chronic kidney disease, stage 4 (severe): Secondary | ICD-10-CM

## 2022-04-07 MED ORDER — PREDNISONE 10 MG PO TABS: 10.0000 mg | ORAL_TABLET | Freq: Every day | ORAL | 2 refills | Status: AC

## 2022-04-07 MED ORDER — HYDROCODONE-ACETAMINOPHEN 5-325 MG PO TABS: 1.0000 | ORAL_TABLET | Freq: Four times a day (QID) | ORAL | 0 refills | Status: AC | PRN

## 2022-04-07 MED ORDER — PREDNISONE 20 MG PO TABS: ORAL_TABLET | ORAL | 0 refills | Status: AC

## 2022-04-07 NOTE — Telephone Encounter (Signed)
I called patient, patient verbalized understanding, referral for [facility] expired, new referral faxed, patient advised to call [facility] to schedule appt.

## 2022-04-07 NOTE — Telephone Encounter (Signed)
I sent prescription for a higher dose of prednisone to take as a 9 day taper starting with 60 mg daily that has been helpful with his previous flare ups. He can go back to the low dose prednisone after finishing this.  I sent a prescription for the 5 mg hydrocodone he can take hopefully would help with sleep.  We definitely need to keep an appointment soon to talk about starting him with the infusion treatment we discussed earlier.

## 2022-04-08 ENCOUNTER — Other Ambulatory Visit: Payer: Self-pay | Admitting: Cardiology

## 2022-04-09 NOTE — Telephone Encounter (Signed)
Prescription refill request for Eliquis received.  Indication: afib  Last office visit: [place], 04/02/2021 Scr: 2.11, 02/07/2022 Age: 66 yo  Weight: 65.8 kg   Pt is overdue for an office visit.  Msg sent to schedulers.

## 2022-04-13 NOTE — Progress Notes (Unsigned)
Office Visit Note  Patient: Brad [name]             Date of Birth: 08-02-1955           MRN: [id number]             PCP: Ladell Pier, MD Referring: Ladell Pier, MD Visit Date: 04/21/2022   Subjective:  Follow-up (Feeling pretty good besides recent flare up episode and hand stiffness.)   History of Present Illness: Brad [name] is a 66 y.o. male here for follow up for chronic tophaceous gout of multiple areas on uloric 40 mg and currently taking prednisone taper for recent gout flare up. He has extensive nodules on multiple joints, with a new erythematous lesion on the back of the left hand. He has had multiple severe flare ups requiring steroids and intermittent pain medication during this year and not achieved goal uric acid on allopurinol or uloric. Renal function numbers have worsened slightly and seeing nephrology for this.  Previous HPI 11/27/2021 Brad [name] is a 66 y.o. male here for follow up for chronic tophaceous gout of multiple areas. He was recently hospitalized due to severe edema of his left leg from the knee down with increased pain with any weight bearing. At the hospital CT scan demonstrated large effusion with some gas present consistent for severe inflammation. Knee aspiration yielded 30cc of turbid yellow fluid consistent with tophi and positive for extracellular and intracellular crystals with negative stain and culture. Since the hospital his pain is greatly improved, currently on prednisone taper at 30 mg/day and now on colchicine 0.3 mg. He developed a blistering lesion on the left foot from his edema and keeping dressing on this. He does not have any particular clinic or nursing care for this.   Previous HPI 11/06/2021 Brad [name] is a 66 y.o. male here for follow up for chronic tophaceous gout of multiple areas.  Laboratory tests her last visit showed significant changes with hemoglobin decreased to 7.9 and estimated GFR decreased.  He was  seen at the emergency department 4 days ago due to worsening pain and left leg swelling lower extremity ultrasound study negative for blood clot.  Joint pains have been somewhat well controlled on the 10 mg prednisone and use of 5 mg Norco 1-2 times daily.  Left leg remains severely swollen he has been unable to work a meaningful amount of hours due to the symptoms.   Previous HPI 10/30/2021 Brad [name] is a 66 y.o. male here for follow up for chronic tophaceous gout of multiple areas. He has had almost constant flare ups with joint pain since our last visit he improves while taking prednisone but symptoms return within days. He has new tophaceous deposits including in his right shoulder. He had some drainage form nodules on right middle finger and elbow and treated this with peroxide and has some residual hyperpigmentation in those areas.   Previous HPI 07/22/2021 Brad [name] is a 66 y.o. male here for follow up for his gout due to new exacerbation symptoms started last Thursday with severe pain and swelling in the right knee.  He has been unable to walk unassisted requiring crutches or wheelchair.  We spoke on the phone called in a prescription for steroid taper took a Medrol Dosepak.  He felt the medicine was effective on the first day but symptoms quickly returned to severe pain and swelling on the steroid he has 1 day remaining currently cannot work due to pain.  Previous HPI 06/02/21 Brad [name] is a 66 y.o. 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 Still has mild pain and stiffness. He has not noticed any problem with allopurinol increase.   Previous HPI 04/28/21 Brad [name] is a 66 y.o. 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 He is currently taking allopurinol 50 mg PO daily after recent start and was prescribed colchicine 0.3 mg daily dose.  He did not start the colchicine due to excessive cost. At the ED evaluation he was prescribed prednisone 30 mg daily taken for the past 5 days and the tramadol as needed. He has felt partial improvement so far, back to about the starting amount of hand swelling when this episode began. His right wrist is the most painful area.     Review of Systems  Constitutional:  Negative for fatigue.  HENT:  Negative for mouth sores and mouth dryness.   Eyes:  Negative for dryness.  Respiratory:  Negative for shortness of breath.   Cardiovascular:  Negative for chest pain and palpitations.  Gastrointestinal:  Negative for blood in stool, constipation and diarrhea.  Endocrine: Negative for increased urination.  Genitourinary:  Negative for involuntary urination.  Musculoskeletal:  Negative for joint pain, gait problem, joint pain, joint swelling, myalgias, muscle weakness, morning stiffness, muscle tenderness and myalgias.  Skin:  Negative for color change, rash, hair loss and sensitivity to sunlight.  Allergic/Immunologic: Negative for susceptible to infections.  Neurological:  Negative for dizziness and headaches.  Hematological:  Negative for swollen glands.  Psychiatric/Behavioral:  Positive for sleep disturbance. Negative for depressed mood. The patient is not nervous/anxious.     PMFS History:  Patient Active Problem List   Diagnosis Date Noted   Iron deficiency anemia due to chronic blood loss 12/17/2021   Non-pressure chronic ulcer of other part of left foot limited to breakdown of skin ([place]) 11/27/2021   Leg pain 11/19/2021   Edema of left lower extremity 11/18/2021   Protein-calorie malnutrition, severe 11/18/2021   Thrombocytosis 11/13/2021   Leukocytosis 11/13/2021   Hypertensive kidney disease with stage 4 chronic kidney disease ([place]) 11/13/2021    Steroid-induced hyperglycemia 11/13/2021   Paroxysmal atrial fibrillation ([place]) 04/02/2021   Chronic kidney disease, stage IV (severe) ([place]) 04/02/2021   Vitamin B 12 deficiency 01/31/2021   Chronic tophaceous gout 01/27/2021   Tobacco dependence 01/27/2021   Anemia, chronic disease 01/27/2021   Essential hypertension 11/21/2016    Past Medical History:  Diagnosis Date   Gout    Hypertension    Kidney failure     Family History  Problem Relation Age of Onset   Diabetes Mother    History reviewed. No pertinent surgical history. Social History   Social History Narrative   ** Merged History Encounter **        There is no immunization history on file for this patient.   Objective: Vital Signs: BP (!) 167/89 (BP Location: Left Arm, Patient Position: Sitting, Cuff Size: Normal)   Pulse 67   Resp 14   Ht 6' (1.829 m)   Wt 146 lb 9.6 oz (66.5 kg)   BMI 19.88 kg/m    Physical Exam HENT:     Mouth/Throat:  Mouth: Mucous membranes are moist.     Pharynx: Oropharynx is clear.  Eyes:     Conjunctiva/sclera: Conjunctivae normal.  Cardiovascular:     Rate and Rhythm: Normal rate and regular rhythm.  Pulmonary:     Effort: Pulmonary effort is normal.     Breath sounds: Normal breath sounds.  Skin:    General: Skin is warm and dry.  Neurological:     Mental Status: He is alert.  Psychiatric:        Mood and Affect: Mood normal.      Musculoskeletal Exam:  Shoulders full ROM no tenderness or swelling Elbows full ROM no tenderness or swelling large tophaceous deposits on extensor surfaces bilaterally Wrists full ROM, no tenderness, swelling on dorsal side of left wrist Fingers with extensive changes as shown in attached photo, most prominent dorsal tophi on left 2nd-3rd MCPs and chronic changes in right 3rd finger Knees full ROM no tenderness or swelling, nodules on anterior knees both sides Ankles full ROM no tenderness or swelling    Investigation: No  additional findings.  Imaging: No results found.  Recent Labs: Lab Results  Component Value Date   WBC 20.1 (H) 04/21/2022   HGB 9.5 (L) 04/21/2022   PLT 482 (H) 04/21/2022   NA 133 (L) 04/21/2022   K 5.0 04/21/2022   CL 106 04/21/2022   CO2 16 (L) 04/21/2022   GLUCOSE 124 (H) 04/21/2022   BUN 46 (H) 04/21/2022   CREATININE 2.60 (H) 04/21/2022   BILITOT 0.4 04/21/2022   ALKPHOS 43 11/30/2021   AST 16 04/21/2022   ALT 12 04/21/2022   PROT 6.8 04/21/2022   ALBUMIN 3.4 (L) 11/30/2021   CALCIUM 8.7 04/21/2022   GFRAA >60 11/25/2016    Speciality Comments: No specialty comments available.  Procedures:  No procedures performed Allergies: Patient has no allergy information on record.   Assessment / Plan:     Visit Diagnoses: Chronic tophaceous gout - Plan: Uric acid, Sedimentation rate  Remains highly active with multiple flares, progressive tophaceous deposits, uric acid remains elevated at 10.4 not able to achieve goal with allopurinol or uloric both tried greater than 3 months duration already. Rechecking uric acid and sed rate today expect these are above goal. Recommending krystexxa infusion after starting mycophenolate IMT at this time in preparation.  High risk medication use - Plan: CBC with Differential/Platelet, COMPLETE METABOLIC PANEL WITH GFR  Checking CBC and CMP for medication monitoring. Discussed risks of mycophenolate including cytopenias, infection risk, GI intolerance, long term malignancy risk. Discussed risks of krystexxa including severe infusion reaction.  CKD stage 4  Currently limits aggressive dose titration of urate lowering therapy. Also causing hi gout to become more severe. Cannot start methotrexate immunomodulatory treatment from this.   Orders: Orders Placed This Encounter  Procedures   Uric acid   Sedimentation rate   CBC with Differential/Platelet   COMPLETE METABOLIC PANEL WITH GFR   No orders of the defined types were placed in  this encounter.    Follow-Up Instructions: Return in about 3 months (around 07/22/2022) for Gout MMF start/krystexxa planned f/u 56mos.   Collier Salina, MD  Note - This record has been created using [employer].  Chart creation errors have been sought, but may not always  have been located. Such creation errors do not reflect on  the standard of medical care.

## 2022-04-21 ENCOUNTER — Ambulatory Visit: Payer: No Typology Code available for payment source | Attending: Internal Medicine | Admitting: Internal Medicine

## 2022-04-21 ENCOUNTER — Encounter: Payer: Self-pay | Admitting: Internal Medicine

## 2022-04-21 VITALS — BP 167/89 | HR 67 | Resp 14 | Ht 72.0 in | Wt 146.6 lb

## 2022-04-21 DIAGNOSIS — L97521 Non-pressure chronic ulcer of other part of left foot limited to breakdown of skin: Secondary | ICD-10-CM

## 2022-04-21 DIAGNOSIS — N184 Chronic kidney disease, stage 4 (severe): Secondary | ICD-10-CM | POA: Diagnosis not present

## 2022-04-21 DIAGNOSIS — M1A9XX1 Chronic gout, unspecified, with tophus (tophi): Secondary | ICD-10-CM

## 2022-04-21 DIAGNOSIS — Z79899 Other long term (current) drug therapy: Secondary | ICD-10-CM | POA: Diagnosis not present

## 2022-04-21 NOTE — Patient Instructions (Signed)
Mycophenolate Capsules What is this medication? MYCOPHENOLATE (mye koe FEN oh late) prevents the body from rejecting an organ transplant. It works by lowering the body's immune system response. This helps the body accept the donor organ. It belongs to a group of medications called immunosuppressants. This medicine may be used for other purposes; ask your health care provider or pharmacist if you have questions. COMMON BRAND NAME(S): CellCept What should I tell my care team before I take this medication? They need to know if you have any of these conditions: Anemia Blood disorder Cancer Diarrhea Immune system problems Infection, such as chickenpox, cold sores, herpes Kidney disease Recent or upcoming vaccine Stomach problems An unusual or allergic reaction to mycophenolate, other medications, foods, dyes, or preservatives Pregnant or trying to get pregnant Breastfeeding How should I use this medication? Take this medication by mouth with a full glass of water. Follow the directions on the prescription label. Take this medication on an empty stomach, at least 1 hour before or 2 hours after food. Do not take with food unless your care team approves. Swallow the medication whole. Do not cut, crush, or chew the medication. If the medication is broken or is not intact, do not get the powder on your skin or eyes. If contact occurs, rinse thoroughly with water. Take your medication at regular intervals. Do not take your medication more often than directed. Do not stop taking except on your care team's advice. A special MedGuide will be given to you by the pharmacist with each prescription and refill. Be sure to read this information carefully each time. Talk to your care team about the use of this medication in children. While this medication may be prescribed for selected conditions, precautions do apply. Overdosage: If you think you have taken too much of this medicine contact a poison control  center or emergency room at once. NOTE: This medicine is only for you. Do not share this medicine with others. What if I miss a dose? If you miss a dose, take it as soon as you can. If it is almost time for your next dose, take only that dose. Do not take double or extra doses. What may interact with this medication? Do not take this medication with any of the following: Live virus vaccines This medication may also interact with the following: Acyclovir or valacyclovir Azathioprine Certain antibiotics, such as ciprofloxacin, levofloxacin, norfloxacin, trimethoprim; sulfamethoxazole, penicillin, amoxicillin; clavulanic acid Certain medications for stomach problems, such as lansoprazole, omeprazole, pantoprazole Cyclosporine Estrogen and progestin hormones Ganciclovir or valganciclovir Isavuconazonium Medications for cholesterol, such as cholestyramine or colestipol Metronidazole Other medications that contain mycophenolate Probenecid Rifampin Sevelamer Stomach acid blockers, such as magnesium hydroxide or aluminum hydroxide Telmisartan This list may not describe all possible interactions. Give your health care provider a list of all the medicines, herbs, non-prescription drugs, or dietary supplements you use. Also tell them if you smoke, drink alcohol, or use illegal drugs. Some items may interact with your medicine. What should I watch for while using this medication? Visit your care team for regular checks on your progress. You will need frequent blood checks during the first few months you are receiving the medication. This medication may affect your coordination, reaction time, or judgment. Do not drive or operate machinery until you know how this medication affects you. Sit up or stand slowly to reduce the risk of dizzy or fainting spells. Drinking alcohol with this medication can increase the risk of these side effects. This medication may  increase your risk of getting an infection.  Call your care team for advice if you get a fever, chills, sore throat, or other symptoms of a cold or flu. Do not treat yourself. Try to avoid being around people who are sick. This medication can make you more sensitive to the sun. Keep out of the sun. If you cannot avoid being in the sun, wear protective clothing and sunscreen. Do not use sun lamps, tanning beds, or tanning booths. Talk to your care team about your risk of cancer. You may be more at risk for certain types of cancer if you take this medication. Talk to your care team if you or your partner may be pregnant. Serious birth defects can occur if you take this medication during pregnancy and for 6 weeks after the last dose. You will need a negative pregnancy test before starting this medication. Estrogen and progestin hormones may not work as well while you are taking this medication. Contraception is recommended while taking this medication and for 6 weeks after the last dose. Your care team can help you find the option that works for you. If your partner can get pregnant, use a condom during sex while taking this medication and for 90 days after the last dose. Talk to your care team before breastfeeding. Changes to your treatment plan may be needed. Do not donate sperm while taking this medication and for 90 days after stopping the last dose. Do not donate blood while you are taking this medication and for 6 weeks after the last dose. Donated blood may contain enough of this medication to cause birth defects in a fetus if transfused to someone who is pregnant. What side effects may I notice from receiving this medication? Side effects that you should report to your care team as soon as possible: Allergic reactions--skin rash, itching, hives, swelling of the face, lips, tongue, or throat Infection--fever, chills, cough, sore throat, wounds that don't heal, pain or trouble when passing urine, general feeling of discomfort or being  unwell Joint, muscle, or tendon pain, swelling, or stiffness Low red blood cell level--unusual weakness or fatigue, dizziness, headache, trouble breathing Peptic ulcer--burning stomach pain, loss of appetite, bloating, burping, heartburn, nausea, vomiting Stomach bleeding--bloody or black, tar-like stools, vomiting blood or brown material that looks like coffee grounds Stomach pain that is severe, does not go away, or gets worse Unusual bruising or bleeding Side effects that usually do not require medical attention (report to your care team if they continue or are bothersome): Diarrhea Dizziness Headache Nausea Swelling of the ankles, hands, or feet Tremors or shaking Trouble sleeping This list may not describe all possible side effects. Call your doctor for medical advice about side effects. You may report side effects to FDA at 1-800-FDA-1088. Where should I keep my medication? Keep out of the reach of children and pets. Store at room temperature between 20 and 25 degrees C (68 and 77 degrees F). Get rid of any unused medication after the expiration date. To get rid of medications that are no longer needed or have expired: Take the medication to a medication take-back program. Check with your pharmacy or law enforcement to find a location. If you cannot return the medication, ask your pharmacist or care team how to get rid of this medication safely. NOTE: This sheet is a summary. It may not cover all possible information. If you have questions about this medicine, talk to your doctor, pharmacist, or health care provider.  2023 Elsevier/Gold  Standard (2021-10-22 00:00:00)

## 2022-04-22 ENCOUNTER — Telehealth: Payer: Self-pay | Admitting: Pharmacist

## 2022-04-22 LAB — CBC WITH DIFFERENTIAL/PLATELET
Absolute Monocytes: 623 cells/uL (ref 200–950)
Basophils Absolute: 40 cells/uL (ref 0–200)
Basophils Relative: 0.2 %
Eosinophils Absolute: 20 cells/uL (ref 15–500)
Eosinophils Relative: 0.1 %
HCT: 27.8 % — ABNORMAL LOW (ref 38.5–50.0)
Hemoglobin: 9.5 g/dL — ABNORMAL LOW (ref 13.2–17.1)
Lymphs Abs: 1508 cells/uL (ref 850–3900)
MCH: 29.4 pg (ref 27.0–33.0)
MCHC: 34.2 g/dL (ref 32.0–36.0)
MCV: 86.1 fL (ref 80.0–100.0)
MPV: 8.9 fL (ref 7.5–12.5)
Monocytes Relative: 3.1 %
Neutro Abs: 17909 cells/uL — ABNORMAL HIGH (ref 1500–7800)
Neutrophils Relative %: 89.1 %
Platelets: 482 10*3/uL — ABNORMAL HIGH (ref 140–400)
RBC: 3.23 10*6/uL — ABNORMAL LOW (ref 4.20–5.80)
RDW: 16.6 % — ABNORMAL HIGH (ref 11.0–15.0)
Total Lymphocyte: 7.5 %
WBC: 20.1 10*3/uL — ABNORMAL HIGH (ref 3.8–10.8)

## 2022-04-22 LAB — COMPLETE METABOLIC PANEL WITH GFR
AG Ratio: 1.2 (calc) (ref 1.0–2.5)
ALT: 12 U/L (ref 9–46)
AST: 16 U/L (ref 10–35)
Albumin: 3.7 g/dL (ref 3.6–5.1)
Alkaline phosphatase (APISO): 47 U/L (ref 35–144)
BUN/Creatinine Ratio: 18 (calc) (ref 6–22)
BUN: 46 mg/dL — ABNORMAL HIGH (ref 7–25)
CO2: 16 mmol/L — ABNORMAL LOW (ref 20–32)
Calcium: 8.7 mg/dL (ref 8.6–10.3)
Chloride: 106 mmol/L (ref 98–110)
Creat: 2.6 mg/dL — ABNORMAL HIGH (ref 0.70–1.35)
Globulin: 3.1 g/dL (calc) (ref 1.9–3.7)
Glucose, Bld: 124 mg/dL — ABNORMAL HIGH (ref 65–99)
Potassium: 5 mmol/L (ref 3.5–5.3)
Sodium: 133 mmol/L — ABNORMAL LOW (ref 135–146)
Total Bilirubin: 0.4 mg/dL (ref 0.2–1.2)
Total Protein: 6.8 g/dL (ref 6.1–8.1)
eGFR: 27 mL/min/{1.73_m2} — ABNORMAL LOW (ref >=60)

## 2022-04-22 LAB — SEDIMENTATION RATE: Sed Rate: 63 mm/h — ABNORMAL HIGH (ref 0–20)

## 2022-04-22 LAB — URIC ACID: Uric Acid, Serum: 10.4 mg/dL — ABNORMAL HIGH (ref 4.0–8.0)

## 2022-04-22 NOTE — Progress Notes (Signed)
Lab results look fine for starting the cellcept as planned. Uric acid remains high at 10.4 We will go ahead with trying to get set up for the infusion medication plan.

## 2022-04-22 NOTE — Telephone Encounter (Addendum)
Prepared Krystexxa infusion referral for [facility] with clinicals. Placed with Dr. Marveen Reeks folder for signature  Brad [name], PharmD, MPH, BCPS, CPP Clinical Pharmacist (Rheumatology and Pulmonology)  ----- Message from Collier Salina, MD sent at 04/22/2022  8:10 AM EDT ----- Regarding: Brad [name] Can we look into krystexxa for [name] again? He has now failed treatment on everything else for gout this year. I am starting him on cellcept as immunomodulatory treatment in anticipation plan to be on this at least one month prior to treatment.

## 2022-04-28 MED ORDER — MYCOPHENOLATE MOFETIL 500 MG PO TABS: 500.0000 mg | ORAL_TABLET | Freq: Two times a day (BID) | ORAL | 2 refills | Status: AC

## 2022-04-28 NOTE — Addendum Note (Signed)
Addended by: Collier Salina on: 04/28/2022 08:30 AM   Modules accepted: Orders

## 2022-04-28 NOTE — Telephone Encounter (Signed)
Referral to [facility] faxed today. Patient will need to start Cellcept one month prior to his first Krystexxa infusion and will need uric acid within 48 hours prior to infusion  Rx for Cellcept 500mg  tiwce daily sent to [facility] today.  ATC patient but unable to reach and VM box is full. Will continue to f/u  Knox Saliva, PharmD, MPH, BCPS, CPP Clinical Pharmacist (Rheumatology and Pulmonology)

## 2022-05-06 ENCOUNTER — Telehealth: Payer: Self-pay | Admitting: Internal Medicine

## 2022-05-06 NOTE — Telephone Encounter (Signed)
[facility] called the office stating they received a referral from [place] for [facility]. She states they have scheduled him multiple times and he has no showed 3x and cancelled last minute 3x.   She states they have no problem scheduling the patient but he will have to pay a $100 deposit before he can be seen since he no longer has insurance.   The patients phone number goes straight to voicemail and his voicemail is full. I attempted to contact the patient to schedule a follow up with [name] that is due in February but was unable to reach the patient.

## 2022-05-10 NOTE — Telephone Encounter (Signed)
Thanks for the update. I'll mention it to him again when we follow up. Not much we can do about his kidney function if he won't go.

## 2022-05-12 NOTE — Telephone Encounter (Signed)
Called [facility] to see status of infusion, indicated that have not been able to reach patient.   Called patient, patients son, and patients brother. Brother answered and stated that Mr. Mikhail passed away last 2022-10-07.   Informed [facility] of patient's passing.  [place] [facility] PharmD Candidate [phone]

## 2022-05-17 ENCOUNTER — Telehealth: Payer: Self-pay | Admitting: Emergency Medicine

## 2022-05-17 NOTE — Telephone Encounter (Signed)
Copied from [place] [id number]. Topic: General - Other >> May 14, 2022  3:30 PM Leitha Schuller wrote: Osvaldo Human states she is pts ex wife and leaving her number if provider need to contact her w/ questions regarding pt

## 2022-05-21 DEATH — deceased

## 2022-06-24 ENCOUNTER — Ambulatory Visit: Payer: No Typology Code available for payment source | Admitting: Internal Medicine

## 2024-04-29 IMAGING — CT CT KNEE*L* W/O CM
1 series · 12 of 14 positions shown, 15 images · non-contrast
Comparison: None Available.

CLINICAL DATA: Knee pain. Aggressive feature on x-ray. Popliteal
fossa mass noted on venous Doppler.

EXAM:
CT OF THE LEFT KNEE WITHOUT CONTRAST
TECHNIQUE: Multidetector CT imaging of the left knee was performed according to
the standard protocol. Multiplanar CT image reconstructions were
also generated.
RADIATION DOSE REDUCTION: This exam was performed according to the
departmental dose-optimization program which includes automated
exposure control, adjustment of the mA and/or kV according to
patient size and/or use of iterative reconstruction technique.

[Series 4: lower ext 1.5 st · axial · 0.42mm/px · z∈[+406,+634]mm · 12 of 180 slices shown, 15 images]
[im 14/180  soft-tissue]
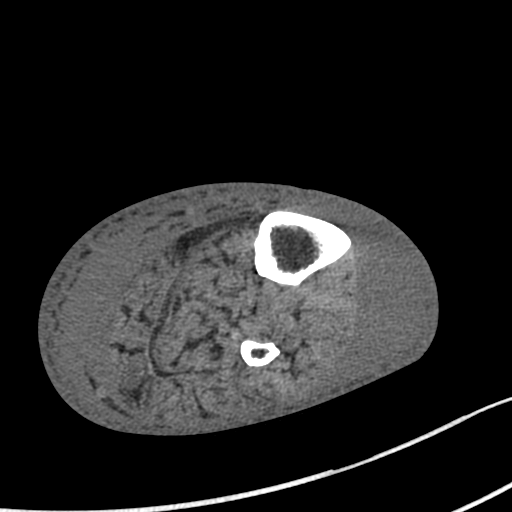
[im 14/180  bone]
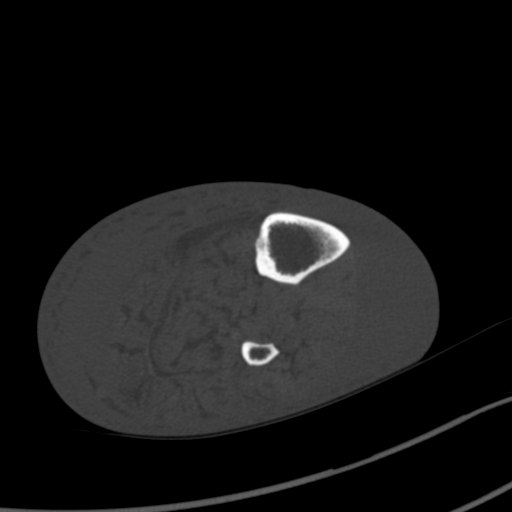
[im 28/180  bone]
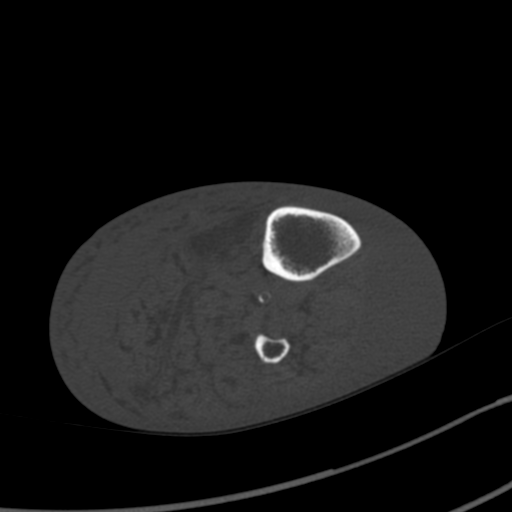
[im 42/180  bone]
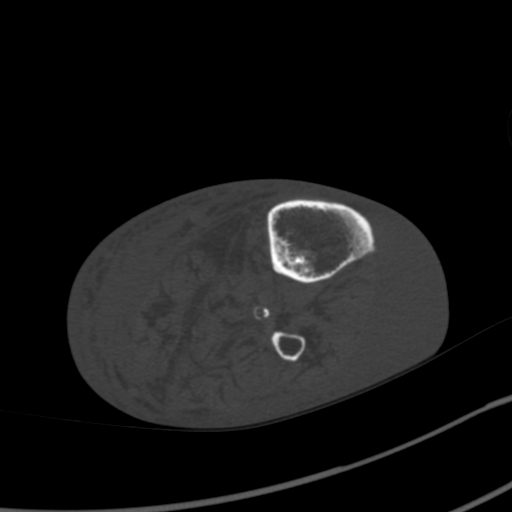
[im 56/180  bone]
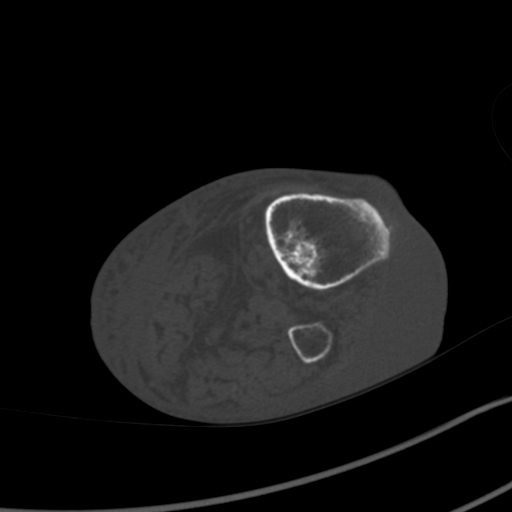
[im 69/180  soft-tissue]
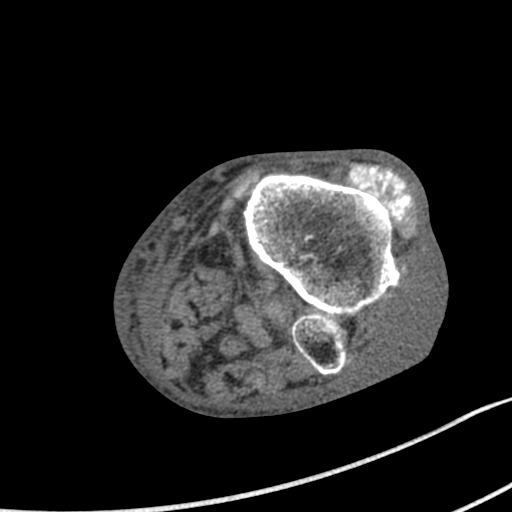
[im 69/180  bone]
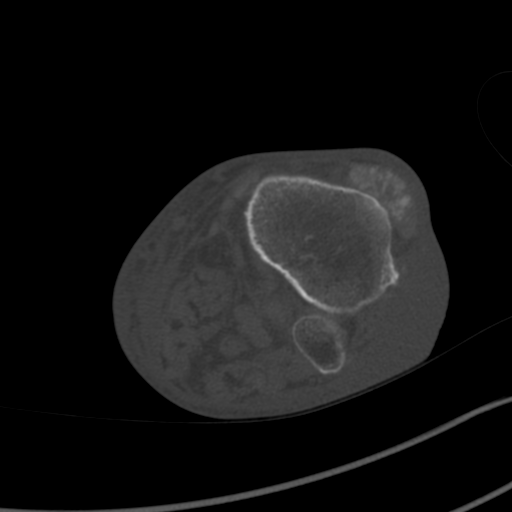
[im 83/180  bone]
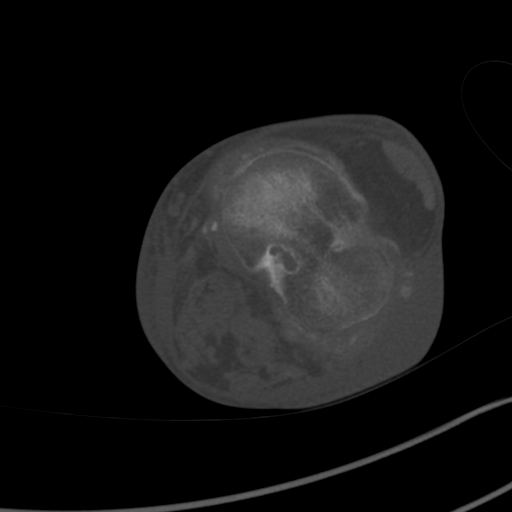
[im 97/180  bone]
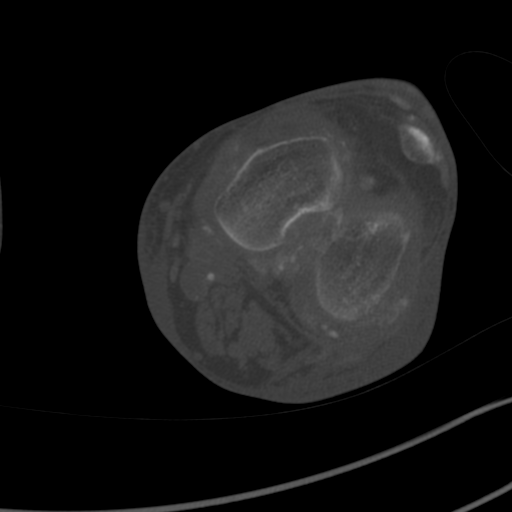
[im 111/180  bone]
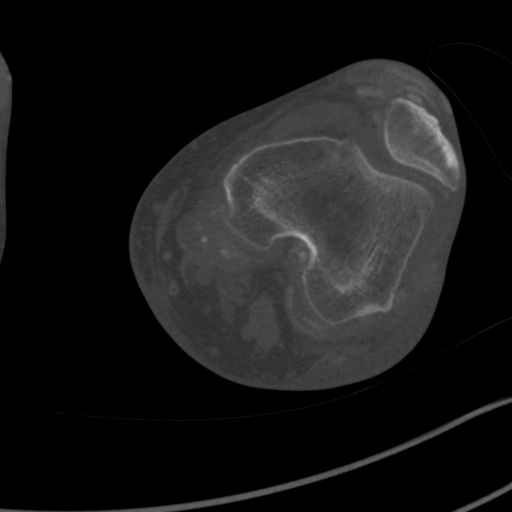
[im 124/180  soft-tissue]
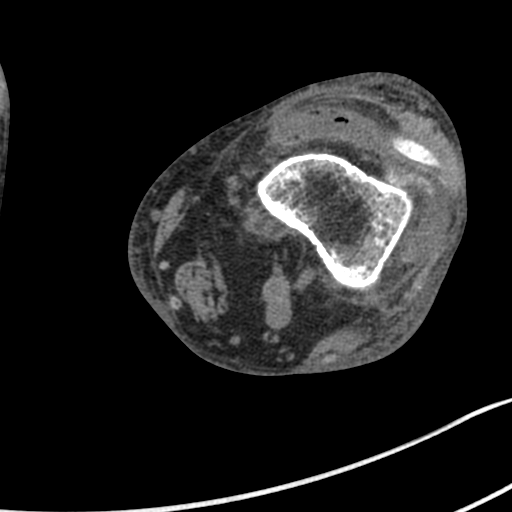
[im 124/180  bone]
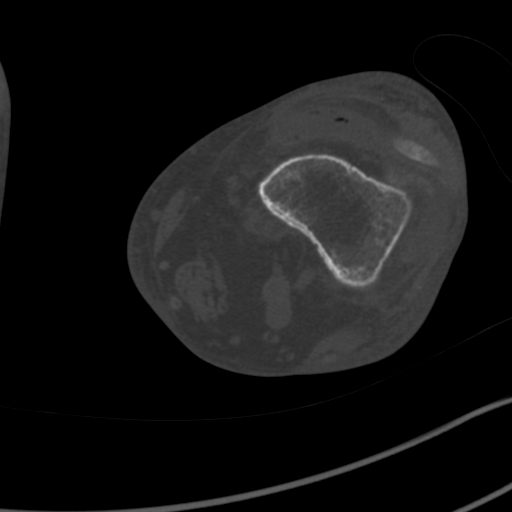
[im 138/180  bone]
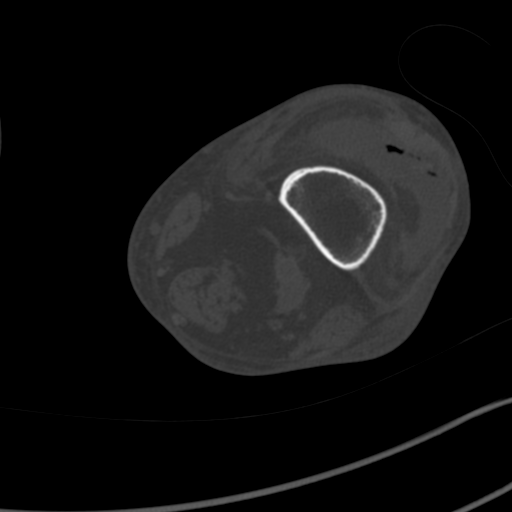
[im 152/180  bone]
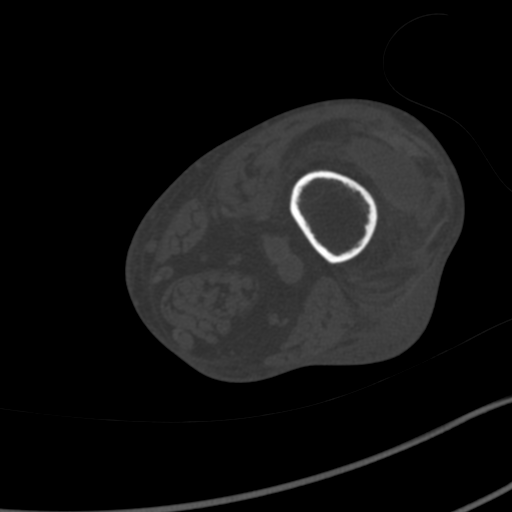
[im 166/180  bone]
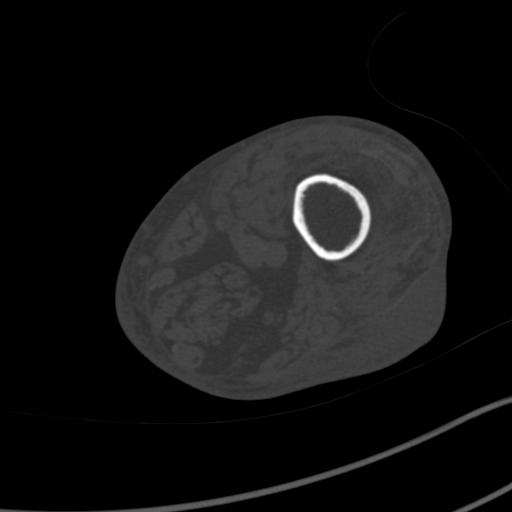

[12 of 14 positions shown; findings below may reference images not displayed]

FINDINGS: Bones/Joint/Cartilage

There is diffuse osteopenia. No evidence of acute fracture or
dislocation. There is moderate size joint effusion with multiple
calcified structures, likely representing loose bodies. There are
multiple small foci of gas in suprapatellar joint effusion
concerning for infectious/inflammatory process.

Geographic peripherally sclerotic lesion in the proximal tibia
likely bone infarct.

Ligaments

Suboptimally assessed by CT.

Muscles and Tendons

There is dystrophic calcification about the insertion of the
patellar tendon. There is also ossification anterior to the patella,
likely sequela of chronic bursitis, which may be secondary to gouty
arthritis.

Soft tissues

Subcutaneous soft tissue edema about posterior and medial aspect of
the leg.
IMPRESSION: 1.  No evidence of fracture or dislocation.

2. Large joint effusion with multiple foci of gas concerning for
infectious/inflammatory process. Joint aspiration for further
management would be helpful. Clinical correlation is suggested.

3. Multiple calcified bodies in the joint space, which may be loose
bodies or sequela of chronic arthritis.

4. Prepatellar calcification as well as advanced enthesopathy of the
patellar tendon, which may be secondary to inflammatory arthropathy
including gouty arthritis.
